# Patient Record
Sex: Female | Born: 1940 | Race: White | Hispanic: No | Smoking: Never smoker
Health system: Southern US, Community
[De-identification: ages and names within clinical notes are randomized; demographics above are authoritative.]

## PROBLEM LIST (undated history)

## (undated) DIAGNOSIS — R55 Syncope and collapse: Secondary | ICD-10-CM

## (undated) DIAGNOSIS — I951 Orthostatic hypotension: Secondary | ICD-10-CM

## (undated) DIAGNOSIS — K219 Gastro-esophageal reflux disease without esophagitis: Secondary | ICD-10-CM

## (undated) DIAGNOSIS — I10 Essential (primary) hypertension: Secondary | ICD-10-CM

## (undated) DIAGNOSIS — W19XXXA Unspecified fall, initial encounter: Secondary | ICD-10-CM

## (undated) DIAGNOSIS — N319 Neuromuscular dysfunction of bladder, unspecified: Secondary | ICD-10-CM

## (undated) DIAGNOSIS — N189 Chronic kidney disease, unspecified: Secondary | ICD-10-CM

## (undated) DIAGNOSIS — I63131 Cerebral infarction due to embolism of right carotid artery: Secondary | ICD-10-CM

## (undated) DIAGNOSIS — C801 Malignant (primary) neoplasm, unspecified: Secondary | ICD-10-CM

## (undated) DIAGNOSIS — I639 Cerebral infarction, unspecified: Secondary | ICD-10-CM

## (undated) DIAGNOSIS — I350 Nonrheumatic aortic (valve) stenosis: Secondary | ICD-10-CM

## (undated) DIAGNOSIS — G2581 Restless legs syndrome: Secondary | ICD-10-CM

## (undated) DIAGNOSIS — R1310 Dysphagia, unspecified: Secondary | ICD-10-CM

## (undated) DIAGNOSIS — G20A1 Parkinson's disease without dyskinesia, without mention of fluctuations: Secondary | ICD-10-CM

## (undated) DIAGNOSIS — R011 Cardiac murmur, unspecified: Secondary | ICD-10-CM

## (undated) DIAGNOSIS — K921 Melena: Secondary | ICD-10-CM

## (undated) DIAGNOSIS — D649 Anemia, unspecified: Secondary | ICD-10-CM

## (undated) DIAGNOSIS — K449 Diaphragmatic hernia without obstruction or gangrene: Secondary | ICD-10-CM

## (undated) DIAGNOSIS — R413 Other amnesia: Secondary | ICD-10-CM

## (undated) DIAGNOSIS — R269 Unspecified abnormalities of gait and mobility: Secondary | ICD-10-CM

## (undated) DIAGNOSIS — R51 Headache: Secondary | ICD-10-CM

## (undated) DIAGNOSIS — R0602 Shortness of breath: Secondary | ICD-10-CM

## (undated) DIAGNOSIS — Z9289 Personal history of other medical treatment: Secondary | ICD-10-CM

## (undated) DIAGNOSIS — E785 Hyperlipidemia, unspecified: Secondary | ICD-10-CM

## (undated) DIAGNOSIS — M1990 Unspecified osteoarthritis, unspecified site: Secondary | ICD-10-CM

## (undated) DIAGNOSIS — G20 Parkinson's disease: Secondary | ICD-10-CM

## (undated) DIAGNOSIS — G4752 REM sleep behavior disorder: Secondary | ICD-10-CM

## (undated) HISTORY — DX: Anemia, unspecified: D64.9

## (undated) HISTORY — DX: Syncope and collapse: R55

## (undated) HISTORY — DX: Neuromuscular dysfunction of bladder, unspecified: N31.9

## (undated) HISTORY — DX: Unspecified osteoarthritis, unspecified site: M19.90

## (undated) HISTORY — DX: Headache: R51

## (undated) HISTORY — PX: CATARACT EXTRACTION, BILATERAL: SHX1313

## (undated) HISTORY — PX: UPPER GI ENDOSCOPY: SHX6162

## (undated) HISTORY — PX: CAROTID ENDARTERECTOMY: SUR193

## (undated) HISTORY — DX: Chronic kidney disease, unspecified: N18.9

## (undated) HISTORY — DX: REM sleep behavior disorder: G47.52

## (undated) HISTORY — DX: Unspecified fall, initial encounter: W19.XXXA

## (undated) HISTORY — DX: Gastro-esophageal reflux disease without esophagitis: K21.9

## (undated) HISTORY — DX: Unspecified abnormalities of gait and mobility: R26.9

## (undated) HISTORY — PX: COLONOSCOPY: SHX174

## (undated) HISTORY — DX: Cerebral infarction due to embolism of right carotid artery: I63.131

## (undated) HISTORY — DX: Malignant (primary) neoplasm, unspecified: C80.1

## (undated) HISTORY — DX: Hyperlipidemia, unspecified: E78.5

## (undated) HISTORY — PX: EYE SURGERY: SHX253

## (undated) HISTORY — DX: Parkinson's disease: G20

## (undated) HISTORY — DX: Parkinson's disease without dyskinesia, without mention of fluctuations: G20.A1

## (undated) HISTORY — DX: Restless legs syndrome: G25.81

## (undated) HISTORY — DX: Dysphagia, unspecified: R13.10

## (undated) HISTORY — DX: Orthostatic hypotension: I95.1

## (undated) HISTORY — DX: Other amnesia: R41.3

## (undated) HISTORY — DX: Essential (primary) hypertension: I10

---

## 2007-06-23 HISTORY — PX: OTHER SURGICAL HISTORY: SHX169

## 2007-08-27 ENCOUNTER — Ambulatory Visit: Payer: Self-pay | Admitting: Unknown Physician Specialty

## 2007-09-06 ENCOUNTER — Ambulatory Visit: Payer: Self-pay | Admitting: Orthopaedic Surgery

## 2007-10-25 ENCOUNTER — Ambulatory Visit: Payer: Self-pay | Admitting: Unknown Physician Specialty

## 2007-12-13 ENCOUNTER — Ambulatory Visit: Payer: Self-pay | Admitting: Anesthesiology

## 2008-01-02 ENCOUNTER — Ambulatory Visit: Payer: Self-pay | Admitting: Anesthesiology

## 2008-02-09 ENCOUNTER — Ambulatory Visit: Payer: Self-pay | Admitting: Anesthesiology

## 2008-03-13 ENCOUNTER — Ambulatory Visit: Payer: Self-pay | Admitting: Gynecologic Oncology

## 2008-03-14 ENCOUNTER — Ambulatory Visit: Payer: Self-pay | Admitting: Anesthesiology

## 2008-03-20 ENCOUNTER — Ambulatory Visit: Payer: Self-pay | Admitting: Gynecologic Oncology

## 2008-03-27 ENCOUNTER — Ambulatory Visit: Payer: Self-pay

## 2008-03-28 ENCOUNTER — Ambulatory Visit: Payer: Self-pay | Admitting: Gynecologic Oncology

## 2008-04-10 ENCOUNTER — Ambulatory Visit: Payer: Self-pay | Admitting: Gynecologic Oncology

## 2008-04-13 ENCOUNTER — Ambulatory Visit: Payer: Self-pay | Admitting: Anesthesiology

## 2008-04-20 ENCOUNTER — Ambulatory Visit: Payer: Self-pay | Admitting: Family Medicine

## 2008-05-21 ENCOUNTER — Ambulatory Visit: Payer: Self-pay | Admitting: Anesthesiology

## 2008-11-06 ENCOUNTER — Encounter: Admission: RE | Admit: 2008-11-06 | Discharge: 2008-11-06 | Payer: Self-pay | Admitting: Neurology

## 2009-05-22 ENCOUNTER — Emergency Department: Payer: Self-pay | Admitting: Emergency Medicine

## 2009-06-06 ENCOUNTER — Emergency Department: Payer: Self-pay | Admitting: Emergency Medicine

## 2009-12-25 ENCOUNTER — Emergency Department: Payer: Self-pay | Admitting: Emergency Medicine

## 2010-01-01 ENCOUNTER — Ambulatory Visit: Payer: Self-pay | Admitting: Family Medicine

## 2010-04-22 ENCOUNTER — Ambulatory Visit (HOSPITAL_COMMUNITY)
Admission: RE | Admit: 2010-04-22 | Discharge: 2010-04-22 | Payer: Self-pay | Source: Home / Self Care | Admitting: Neurology

## 2010-06-05 ENCOUNTER — Encounter
Admission: RE | Admit: 2010-06-05 | Discharge: 2010-06-23 | Payer: Self-pay | Source: Home / Self Care | Attending: Neurology | Admitting: Neurology

## 2010-06-23 ENCOUNTER — Encounter
Admission: RE | Admit: 2010-06-23 | Discharge: 2010-07-22 | Payer: Self-pay | Source: Home / Self Care | Attending: Neurology | Admitting: Neurology

## 2010-07-02 ENCOUNTER — Encounter: Admit: 2010-07-02 | Payer: Self-pay | Admitting: Neurology

## 2010-07-04 ENCOUNTER — Encounter: Admit: 2010-07-04 | Payer: Self-pay | Admitting: Neurology

## 2010-07-09 ENCOUNTER — Encounter: Admit: 2010-07-09 | Payer: Self-pay | Admitting: Neurology

## 2010-07-12 ENCOUNTER — Encounter: Payer: Self-pay | Admitting: Neurology

## 2010-08-15 ENCOUNTER — Other Ambulatory Visit (HOSPITAL_COMMUNITY): Payer: Self-pay | Admitting: Neurology

## 2010-08-20 ENCOUNTER — Ambulatory Visit (HOSPITAL_COMMUNITY)
Admission: RE | Admit: 2010-08-20 | Discharge: 2010-08-20 | Disposition: A | Discharge status: Home / Self Care | Payer: Medicare Other | Source: Ambulatory Visit | Attending: Neurology | Admitting: Neurology

## 2010-08-20 DIAGNOSIS — G20 Parkinson's disease: Secondary | ICD-10-CM | POA: Insufficient documentation

## 2010-08-20 DIAGNOSIS — R1310 Dysphagia, unspecified: Secondary | ICD-10-CM | POA: Insufficient documentation

## 2010-08-20 DIAGNOSIS — G20A1 Parkinson's disease without dyskinesia, without mention of fluctuations: Secondary | ICD-10-CM | POA: Insufficient documentation

## 2010-10-09 ENCOUNTER — Inpatient Hospital Stay: Payer: Self-pay | Admitting: Internal Medicine

## 2010-10-17 ENCOUNTER — Ambulatory Visit: Payer: Medicare Other | Attending: Neurology | Admitting: Physical Therapy

## 2010-12-12 ENCOUNTER — Ambulatory Visit: Payer: Self-pay | Admitting: Internal Medicine

## 2010-12-26 ENCOUNTER — Ambulatory Visit: Payer: Self-pay | Admitting: Internal Medicine

## 2011-01-21 ENCOUNTER — Ambulatory Visit: Payer: Self-pay | Admitting: Internal Medicine

## 2011-01-21 ENCOUNTER — Ambulatory Visit: Payer: Self-pay | Admitting: Gastroenterology

## 2011-01-23 LAB — PATHOLOGY REPORT

## 2011-12-10 ENCOUNTER — Ambulatory Visit: Payer: Self-pay | Admitting: Internal Medicine

## 2012-02-15 ENCOUNTER — Other Ambulatory Visit: Payer: Self-pay | Admitting: Neurology

## 2012-02-15 ENCOUNTER — Ambulatory Visit
Admission: RE | Admit: 2012-02-15 | Discharge: 2012-02-15 | Disposition: A | Discharge status: Home / Self Care | Payer: Medicare Other | Source: Ambulatory Visit | Attending: Neurology | Admitting: Neurology

## 2012-02-15 DIAGNOSIS — M25562 Pain in left knee: Secondary | ICD-10-CM

## 2012-05-04 ENCOUNTER — Ambulatory Visit: Payer: Self-pay | Admitting: Internal Medicine

## 2012-06-21 DIAGNOSIS — E785 Hyperlipidemia, unspecified: Secondary | ICD-10-CM | POA: Insufficient documentation

## 2012-06-21 DIAGNOSIS — I10 Essential (primary) hypertension: Secondary | ICD-10-CM | POA: Insufficient documentation

## 2012-06-21 DIAGNOSIS — H269 Unspecified cataract: Secondary | ICD-10-CM | POA: Insufficient documentation

## 2012-06-21 DIAGNOSIS — IMO0001 Reserved for inherently not codable concepts without codable children: Secondary | ICD-10-CM | POA: Insufficient documentation

## 2012-06-21 DIAGNOSIS — R413 Other amnesia: Secondary | ICD-10-CM | POA: Insufficient documentation

## 2012-06-21 DIAGNOSIS — R1310 Dysphagia, unspecified: Secondary | ICD-10-CM | POA: Insufficient documentation

## 2012-10-04 ENCOUNTER — Ambulatory Visit (INDEPENDENT_AMBULATORY_CARE_PROVIDER_SITE_OTHER): Payer: Medicare Other | Admitting: Neurology

## 2012-10-04 ENCOUNTER — Encounter: Payer: Self-pay | Admitting: Neurology

## 2012-10-04 VITALS — BP 128/54 | HR 78 | Ht 61.0 in | Wt 124.0 lb

## 2012-10-04 DIAGNOSIS — G20A1 Parkinson's disease without dyskinesia, without mention of fluctuations: Secondary | ICD-10-CM | POA: Insufficient documentation

## 2012-10-04 DIAGNOSIS — R269 Unspecified abnormalities of gait and mobility: Secondary | ICD-10-CM

## 2012-10-04 DIAGNOSIS — G20 Parkinson's disease: Secondary | ICD-10-CM

## 2012-10-04 NOTE — Progress Notes (Signed)
Reason for visit: Parkinson's disease  Brianna Coleman is an 72 y.o. female  History of present illness:  Brianna Coleman is a 72 year old right-handed white female with a history of Parkinson's disease. The patient has been doing fairly well. The patient was having episodes of dizziness and syncope, but she has not had any further problems with this. The patient has had some gait instability, and she uses a cane for ambulation. The patient has not had any falls. The patient may stagger some. The patient is being followed for chronic renal insufficiency. The patient is getting Epogen shots for anemia. The patient is staying active, and she is engaged in exercises through the [employer] program. The patient returns to this office for an evaluation. The patient notes that in the morning, she is quite slow, and she is more unsteady on her feet for about 1 or 2 hours. The medication then kicks in, and she does better towards the afternoon.  Past Medical History  Diagnosis Date  . Parkinson disease   . Dyslipidemia   . Hypertension   . Arthritis, degenerative   . Gastroesophageal reflux disease   . RLS (restless legs syndrome)   . Neurogenic bladder   . Dysphagia   . Chronic renal insufficiency   . Anemia   . Memory disturbance   . Episode of syncope     Near-syncope    Past Surgical History  Procedure Laterality Date  . Cataract extraction, bilateral      Family History  Problem Relation Age of Onset  . Cancer Mother   . Heart attack Father   . Diabetes Brother   . Diabetes Brother     Social history:  reports that she has never smoked. She does not have any smokeless tobacco history on file. She reports that she does not drink alcohol. Her drug history is not on file.  Allergies: No Known Allergies  Medications:  No current outpatient prescriptions on file prior to visit.   No current facility-administered medications on file prior to visit.    ROS:  Out of a  complete 14 system review of symptoms, the patient complains only of the following symptoms, and all other reviewed systems are negative.  Weight loss, fatigue Heart murmur, swelling in the legs Difficulty swallowing Shortness of breath, wheezing Incontinence of bowel Easy bruising, easy bleeding Muscle cramps Memory loss, dizziness Snoring, restless legs  Blood pressure 128/54, pulse 78, height 5\' 1"  (1.549 m), weight 124 lb (56.246 kg).  Physical Exam  General: The patient is alert and cooperative at the time of the examination.  Skin: 2-3+ ankle edema is noted bilaterally.   Neurologic Exam  Cranial nerves: Facial symmetry is present. Speech is normal, no aphasia or dysarthria is noted. Extraocular movements are full. Visual fields are full.  Motor: The patient has good strength in all 4 extremities.  Coordination: The patient has good finger-nose-finger and heel-to-shin bilaterally.  Gait and station: The patient is able to arise from a seated position with the arms crossed. Once up, the patient ambulates quite well independently, slight decrease in arm swing on the left. Tandem gait is slightly unsteady. Romberg is negative. No drift is seen.  Reflexes: Deep tendon reflexes are symmetric.   Assessment/Plan:  One. Parkinson's disease  2. Gait instability  The patient is actually doing quite well at this time. The patient does not function well first thing in the morning, and she needs to take her first dose of Sinemet even before  she gets out of bed. The patient is remaining active, and she is progressing very slowly with her Parkinson's disease. The patient should use a cane when out of the house. The patient will followup in 5 months.  Marlan Palau MD 10/04/2012 6:29 PM  [facility] [address] [place], [place] [address]  Phone [phone] Fax [phone]

## 2012-12-21 ENCOUNTER — Other Ambulatory Visit: Payer: Self-pay

## 2012-12-21 MED ORDER — HYDROCODONE-ACETAMINOPHEN 5-325 MG PO TABS: 1.0000 | ORAL_TABLET | Freq: Four times a day (QID) | ORAL | Status: DC | PRN

## 2013-03-03 ENCOUNTER — Ambulatory Visit (INDEPENDENT_AMBULATORY_CARE_PROVIDER_SITE_OTHER): Payer: Medicare Other | Admitting: Neurology

## 2013-03-03 ENCOUNTER — Encounter: Payer: Self-pay | Admitting: Neurology

## 2013-03-03 VITALS — BP 108/53 | HR 58 | Ht 60.4 in | Wt 125.0 lb

## 2013-03-03 DIAGNOSIS — G20 Parkinson's disease: Secondary | ICD-10-CM

## 2013-03-03 DIAGNOSIS — R269 Unspecified abnormalities of gait and mobility: Secondary | ICD-10-CM

## 2013-03-03 MED ORDER — PRAMIPEXOLE DIHYDROCHLORIDE 1 MG PO TABS: 1.0000 mg | ORAL_TABLET | Freq: Three times a day (TID) | ORAL | Status: DC

## 2013-03-03 NOTE — Progress Notes (Signed)
Reason for visit: Parkinson's disease  Brianna Coleman is an 72 y.o. female  History of present illness:  Brianna Coleman is a 72 year old right-handed white female with a history of Parkinson's disease, chronic renal insufficiency, and hemodialysis may be required in the future. Last December 2013, the patient was having episodes of near-syncope, and she was found to have severe anemia associated with a chronic renal insufficiency. The patient has been on erythropoietin, and her blood count has improved to a hemoglobin of around 9. The patient is still having some mild gait instability, and she notes that the left leg will cross over at times, making her stumble. The patient uses a cane inconsistently. The patient still has some problems with restless leg syndrome, but she is resting well at night. The patient does report some occasional dizziness. Her mobility has gradually worsened over time. The patient is involved with silver sneakers, but she does not go to the classes on a regular basis. The patient returns for an evaluation. The patient does report some ongoing issues with swallowing and choking.  Past Medical History  Diagnosis Date  . Parkinson disease   . Dyslipidemia   . Hypertension   . Arthritis, degenerative   . Gastroesophageal reflux disease   . RLS (restless legs syndrome)   . Neurogenic bladder   . Dysphagia   . Chronic renal insufficiency   . Anemia   . Memory disturbance   . Episode of syncope     Near-syncope  . Gait disorder     Past Surgical History  Procedure Laterality Date  . Cataract extraction, bilateral      Family History  Problem Relation Age of Onset  . Cancer Mother   . Heart attack Father   . Diabetes Brother   . Diabetes Brother     Social history:  reports that she has never smoked. She has never used smokeless tobacco. She reports that she does not drink alcohol or use illicit drugs.   No Known Allergies  Medications:  Current  Outpatient Prescriptions on File Prior to Visit  Medication Sig Dispense Refill  . aspirin 81 MG tablet Take 81 mg by mouth daily.      . carbidopa-levodopa (SINEMET IR) 25-250 MG per tablet Take 25-250 tablets by mouth 4 (four) times daily. 1/2 tablet four times daily      . epoetin alfa (EPOGEN,PROCRIT) 2000 UNIT/ML injection Inject 40,000 Units into the skin once. Once monthly      . HYDROcodone-acetaminophen (NORCO/VICODIN) 5-325 MG per tablet Take 1 tablet by mouth every 6 (six) hours as needed for pain.  90 tablet  1  . metoprolol succinate (TOPROL-XL) 100 MG 24 hr tablet Take 100 mg by mouth daily.      Marland Kitchen NIFEdipine (PROCARDIA-XL/ADALAT CC) 30 MG 24 hr tablet Take 30 mg by mouth daily.       No current facility-administered medications on file prior to visit.    ROS:  Out of a complete 14 system review of symptoms, the patient complains only of the following symptoms, and all other reviewed systems are negative.  Swelling the legs Difficulty swallowing Itching Shortness of breath, wheezing, snoring Incontinence of bowel and bladder Anemia, easy bruising Feeling cold Joint pain Memory loss, weakness, slurred speech, difficulty swallowing, dizziness Snoring, restless legs  Blood pressure 108/53, pulse 58, height 5' 0.4" (1.534 m), weight 125 lb (56.7 kg).  Physical Exam  General: The patient is alert and cooperative at the time of the examination.  Mild masking of the face is seen.  Skin: 2-3+ edema is noted below the knees bilaterally.   Neurologic Exam  Cranial nerves: Facial symmetry is present. Speech is normal, no aphasia or dysarthria is noted. Extraocular movements are full. Visual fields are full.  Motor: The patient has good strength in all 4 extremities.  Coordination: The patient has good finger-nose-finger and heel-to-shin bilaterally.  Gait and station: The patient is able to arise from a seated position with arms crossed. Once up, the patient appears to  have good stride and good turns, the left arm is in a dystonic posture, extended, and slightly behind her. Decreased arm swing is noted on the left. The patient usually uses a cane for ambulation. Tandem gait is unsteady. Romberg is negative. No drift is seen.  Reflexes: Deep tendon reflexes are symmetric.   Assessment/Plan:  One. Parkinson's disease  2. Gait disorder  3. Chronic renal insufficiency  4. Dysphagia  5. Memory disorder  The patient appears to have relatively good mobility, but she needs to remain active with her exercise programs. The patient will be increased on the Mirapex taking 1 mg 3 times daily. The patient will followup in 4 or 5 months. The Sinemet will not be readjusted, the patient will continue the 25/250 tablets, taking one half tablet 4 times daily.  Brianna Palau MD 03/03/2013 9:02 PM  [facility] [address] [place], [place] [address]  Phone [phone] Fax [phone]

## 2013-03-06 ENCOUNTER — Ambulatory Visit: Payer: Medicare Other | Admitting: Neurology

## 2013-06-26 ENCOUNTER — Ambulatory Visit: Payer: Self-pay | Admitting: Hematology and Oncology

## 2013-07-06 ENCOUNTER — Ambulatory Visit: Payer: Self-pay | Admitting: Hematology and Oncology

## 2013-07-06 LAB — CBC CANCER CENTER
BASOS ABS: 0 x10 3/mm (ref 0.0–0.1)
Basophil %: 1 %
EOS ABS: 0 x10 3/mm (ref 0.0–0.7)
Eosinophil %: 0.3 %
HCT: 23.1 % — ABNORMAL LOW (ref 35.0–47.0)
HGB: 6.8 g/dL — ABNORMAL LOW (ref 12.0–16.0)
LYMPHS ABS: 0.6 x10 3/mm — AB (ref 1.0–3.6)
Lymphocyte %: 17.6 %
MCH: 22.6 pg — ABNORMAL LOW (ref 26.0–34.0)
MCHC: 29.4 g/dL — ABNORMAL LOW (ref 32.0–36.0)
MCV: 77 fL — AB (ref 80–100)
Monocyte #: 0.3 x10 3/mm (ref 0.2–0.9)
Monocyte %: 7.3 %
NEUTROS PCT: 73.8 %
Neutrophil #: 2.7 x10 3/mm (ref 1.4–6.5)
Platelet: 232 x10 3/mm (ref 150–440)
RBC: 3.02 10*6/uL — ABNORMAL LOW (ref 3.80–5.20)
RDW: 18.3 % — AB (ref 11.5–14.5)
WBC: 3.7 x10 3/mm (ref 3.6–11.0)

## 2013-07-06 LAB — BASIC METABOLIC PANEL
Anion Gap: 10 (ref 7–16)
BUN: 46 mg/dL — ABNORMAL HIGH (ref 7–18)
CALCIUM: 8.2 mg/dL — AB (ref 8.5–10.1)
CO2: 24 mmol/L (ref 21–32)
CREATININE: 2.39 mg/dL — AB (ref 0.60–1.30)
Chloride: 105 mmol/L (ref 98–107)
EGFR (African American): 23 — ABNORMAL LOW
EGFR (Non-African Amer.): 19 — ABNORMAL LOW
Glucose: 94 mg/dL (ref 65–99)
Osmolality: 289 (ref 275–301)
Potassium: 4.6 mmol/L (ref 3.5–5.1)
Sodium: 139 mmol/L (ref 136–145)

## 2013-07-10 ENCOUNTER — Other Ambulatory Visit: Payer: Self-pay | Admitting: Neurology

## 2013-07-17 ENCOUNTER — Telehealth: Payer: Self-pay | Admitting: Neurology

## 2013-07-17 NOTE — Telephone Encounter (Signed)
Daughter called in and stated that her mother had a really bad episode late last week and they wanted to know if they would be able to get in to see Dr. Jannifer Franklin earlier than the March appointment.  Daughter stated that it was a "freezing" type episode, where her mother was in the process of getting out of the car, and then just everything froze and her body wouldn't move and her brain wasn't telling it what to do.  Daughter stated that during exercise class her mother was unable to understand the instructor or get her body to follow along with the steps.  Also stated that she was disoriented and out of it for quite a little bit.  Please call.  Thank you.

## 2013-07-17 NOTE — Telephone Encounter (Signed)
I called the daughter. The patient had an event of freezing getting out of a car, and had some confusion for about an hour afterwards. The episode was a one-time event, this has not recurred. I'll try to get the patient worked in sometime within the next 2 or 3 weeks. Her next revisit is not until March.

## 2013-07-23 ENCOUNTER — Ambulatory Visit: Payer: Self-pay | Admitting: Hematology and Oncology

## 2013-07-26 ENCOUNTER — Encounter (INDEPENDENT_AMBULATORY_CARE_PROVIDER_SITE_OTHER): Payer: Self-pay

## 2013-07-26 ENCOUNTER — Ambulatory Visit (INDEPENDENT_AMBULATORY_CARE_PROVIDER_SITE_OTHER): Payer: Medicare Other | Admitting: Neurology

## 2013-07-26 ENCOUNTER — Encounter: Payer: Self-pay | Admitting: Neurology

## 2013-07-26 VITALS — BP 115/63 | HR 69 | Wt 127.0 lb

## 2013-07-26 DIAGNOSIS — R413 Other amnesia: Secondary | ICD-10-CM

## 2013-07-26 DIAGNOSIS — R4182 Altered mental status, unspecified: Secondary | ICD-10-CM

## 2013-07-26 DIAGNOSIS — R269 Unspecified abnormalities of gait and mobility: Secondary | ICD-10-CM

## 2013-07-26 DIAGNOSIS — G20 Parkinson's disease: Secondary | ICD-10-CM

## 2013-07-26 MED ORDER — HYDROCODONE-ACETAMINOPHEN 5-325 MG PO TABS: 1.0000 | ORAL_TABLET | Freq: Four times a day (QID) | ORAL | Status: DC | PRN

## 2013-07-26 NOTE — Patient Instructions (Signed)

## 2013-07-26 NOTE — Progress Notes (Signed)
Reason for visit: Parkinson's disease  Brianna Coleman is an 73 y.o. female  History of present illness:    Brianna Coleman is a 73 year old right-handed white female with a history of Parkinson's disease. The patient has had an event recently unassociated with a freezing episode while getting out of a car. The patient was unable to move for several moments, but eventually she began being able to walk. The patient usually walks with a cane. The patient however, appeared to be somewhat confused for about an hour after the event. The patient has had at least 2 other events associated with confusion, but the patient may also have some slurred speech. The patient was noted recently to have an episode of left-sided facial drooping. The patient has not had any falls. The patient is having some problems with scissoring of her left leg across the right and she is trying to walk. The patient may also walk with the left arm behind her, and with decreased arm swing on the left. The patient occasionally will miss the midday doses of her medication, and she seems to be having more general issues with memory. The patient denies any problems with choking with swallowing. The patient returns to this office for an evaluation.  Past Medical History  Diagnosis Date  . Parkinson disease   . Dyslipidemia   . Hypertension   . Arthritis, degenerative   . Gastroesophageal reflux disease   . RLS (restless legs syndrome)   . Neurogenic bladder   . Dysphagia   . Chronic renal insufficiency   . Anemia   . Memory disturbance   . Episode of syncope     Near-syncope  . Gait disorder     Past Surgical History  Procedure Laterality Date  . Cataract extraction, bilateral      Family History  Problem Relation Age of Onset  . Cancer Mother   . Heart attack Father   . Diabetes Brother   . Diabetes Brother     Social history:  reports that she has never smoked. She has never used smokeless tobacco. She reports  that she does not drink alcohol or use illicit drugs.   No Known Allergies  Medications:  Current Outpatient Prescriptions on File Prior to Visit  Medication Sig Dispense Refill  . aspirin 81 MG tablet Take 81 mg by mouth daily.      . carbidopa-levodopa (SINEMET IR) 25-250 MG per tablet TAKE 1/2 A TABLET BY MOUTH 4 TIMES A DAY (MORNING,NOON,4 PM, AND NIGHT)  180 tablet  1  . enalapril (VASOTEC) 10 MG tablet Take 5 mg by mouth daily.       Marland Kitchen epoetin alfa (EPOGEN,PROCRIT) 2000 UNIT/ML injection Inject 40,000 Units into the skin once. Once monthly      . metoprolol succinate (TOPROL-XL) 100 MG 24 hr tablet Take 100 mg by mouth daily.      Marland Kitchen NIFEdipine (PROCARDIA-XL/ADALAT CC) 30 MG 24 hr tablet Take 30 mg by mouth daily.      . pramipexole (MIRAPEX) 1 MG tablet Take 1 tablet (1 mg total) by mouth 3 (three) times daily.  270 tablet  3   No current facility-administered medications on file prior to visit.    ROS:  Out of a complete 14 system review of symptoms, the patient complains only of the following symptoms, and all other reviewed systems are negative.  Fatigue Difficulty swallowing Heart murmur Cold intolerance Restless legs Snoring, sleep talking Incontinence of bladder, frequency of urination, urinary  urgency Back pain, difficulty walking, coordination problems Bruising easily, anemia Memory loss, dizziness, speech problems, weakness, facial drooping, left side Confusion   Blood pressure 115/63, pulse 69, weight 127 lb (57.607 kg).  Physical Exam  General: The patient is alert and cooperative at the time of the examination.  Skin: 1+ edema at the ankles is noted bilaterally.   Neurologic Exam  Mental status: The patient is oriented x 3. Mini-Mental status examination done today shows a total score of 27/30.  Cranial nerves: Facial symmetry is present. Speech is normal, no aphasia or dysarthria is noted. Extraocular movements are full. Visual fields are full.  Masking of the face is seen.  Motor: The patient has good strength in all 4 extremities.  Sensory examination: Soft touch sensation is symmetric on the face, arms, or legs.  Coordination: The patient has good finger-nose-finger and heel-to-shin bilaterally.  Gait and station: The patient has a relatively stable gait. Decreased arm swing is noted with the left arm. The patient carries the left arm slightly behind her. The patient is able to a arise from a seated position with arms crossed. Tandem gait is minimally unsteady. Romberg is negative. No drift is seen.  Reflexes: Deep tendon reflexes are symmetric.   Assessment/Plan:  One. Parkinson's disease  2. Episodic confusion, freezing  The patient certainly may be at risk for freezing episodes, but episodes described recently are associated with confusion, and the patient has had an episode of left facial drooping and slurred speech. The patient will be evaluated for possible cerebrovascular disease. The patient will be set up for MRI evaluation of the brain, carotid Doppler study, and an EEG study. The patient is on low-dose aspirin. In the future, Comtan may be added to her medication regimen. The patient will followup in March of 2015. 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  Jill Alexanders MD 07/26/2013 8:26 PM  [facility] [address] [place], [place] [address]  Phone [phone] Fax [phone]

## 2013-08-02 ENCOUNTER — Ambulatory Visit (INDEPENDENT_AMBULATORY_CARE_PROVIDER_SITE_OTHER): Payer: Medicare Other

## 2013-08-02 ENCOUNTER — Telehealth: Payer: Self-pay | Admitting: Neurology

## 2013-08-02 DIAGNOSIS — R413 Other amnesia: Secondary | ICD-10-CM

## 2013-08-02 DIAGNOSIS — R269 Unspecified abnormalities of gait and mobility: Secondary | ICD-10-CM

## 2013-08-02 DIAGNOSIS — G20 Parkinson's disease: Secondary | ICD-10-CM

## 2013-08-02 DIAGNOSIS — R4182 Altered mental status, unspecified: Secondary | ICD-10-CM

## 2013-08-02 NOTE — Telephone Encounter (Signed)
I called patient and I talked with the caretaker. The patient had an EEG study today that was normal. The patient continues to have some problems with memory and confusion, no further freezing episodes. MRI the brain and carotid Doppler studies are pending.

## 2013-08-02 NOTE — Procedures (Signed)
    History:  Brianna Coleman is a 73 year old patient with a history of Parkinson's disease. The patient recently had an event of freezing up associated with confusion and slurred speech lasting about one hour. The patient is being evaluated for this event.  This is a routine EEG. No skull defects are noted. Medications include aspirin, Sinemet, Vasotec, Epogen, hydrocodone, metoprolol, nifedipine, and Mirapex.   EEG classification: Normal awake  Description of the recording: The background rhythms of this recording consists of a fairly well modulated medium amplitude alpha rhythm of 8 Hz that is reactive to eye opening and closure. As the record progresses, the patient appears to remain in the waking state throughout the recording. Photic stimulation was performed, resulting in a bilateral and symmetric photic driving response. Hyperventilation was also performed, resulting in a minimal buildup of the background rhythm activities without significant slowing seen. At no time during the recording does there appear to be evidence of spike or spike wave discharges or evidence of focal slowing. EKG monitor shows no evidence of cardiac rhythm abnormalities with a heart rate of 56.  Impression: This is a normal EEG recording in the waking state. No evidence of ictal or interictal discharges are seen.

## 2013-08-03 ENCOUNTER — Inpatient Hospital Stay: Payer: Self-pay | Admitting: Internal Medicine

## 2013-08-03 LAB — CBC WITH DIFFERENTIAL/PLATELET
BASOS PCT: 1 %
Basophil #: 0 10*3/uL (ref 0.0–0.1)
Eosinophil #: 0 10*3/uL (ref 0.0–0.7)
Eosinophil %: 0.5 %
HCT: 18.6 % — ABNORMAL LOW (ref 35.0–47.0)
HGB: 5.9 g/dL — AB (ref 12.0–16.0)
Lymphocyte #: 0.5 10*3/uL — ABNORMAL LOW (ref 1.0–3.6)
Lymphocyte %: 18.2 %
MCH: 22.8 pg — AB (ref 26.0–34.0)
MCHC: 31.5 g/dL — AB (ref 32.0–36.0)
MCV: 72 fL — ABNORMAL LOW (ref 80–100)
MONOS PCT: 8.1 %
Monocyte #: 0.2 x10 3/mm (ref 0.2–0.9)
Neutrophil #: 2.1 10*3/uL (ref 1.4–6.5)
Neutrophil %: 72.2 %
Platelet: 144 10*3/uL — ABNORMAL LOW (ref 150–440)
RBC: 2.58 10*6/uL — ABNORMAL LOW (ref 3.80–5.20)
RDW: 18.6 % — ABNORMAL HIGH (ref 11.5–14.5)
WBC: 2.9 10*3/uL — ABNORMAL LOW (ref 3.6–11.0)

## 2013-08-03 LAB — PROTIME-INR
INR: 1.1
Prothrombin Time: 13.8 secs (ref 11.5–14.7)

## 2013-08-03 LAB — IRON AND TIBC
IRON BIND. CAP.(TOTAL): 373 ug/dL (ref 250–450)
IRON: 18 ug/dL — AB (ref 50–170)
Iron Saturation: 5 %
UNBOUND IRON-BIND. CAP.: 355 ug/dL

## 2013-08-03 LAB — TROPONIN I

## 2013-08-03 LAB — COMPREHENSIVE METABOLIC PANEL
ALK PHOS: 66 U/L
AST: 16 U/L (ref 15–37)
Albumin: 3.1 g/dL — ABNORMAL LOW (ref 3.4–5.0)
Anion Gap: 3 — ABNORMAL LOW (ref 7–16)
BILIRUBIN TOTAL: 0.2 mg/dL (ref 0.2–1.0)
BUN: 58 mg/dL — AB (ref 7–18)
CHLORIDE: 112 mmol/L — AB (ref 98–107)
CO2: 22 mmol/L (ref 21–32)
Calcium, Total: 8.5 mg/dL (ref 8.5–10.1)
Creatinine: 2.47 mg/dL — ABNORMAL HIGH (ref 0.60–1.30)
EGFR (African American): 22 — ABNORMAL LOW
GFR CALC NON AF AMER: 19 — AB
Glucose: 84 mg/dL (ref 65–99)
Osmolality: 289 (ref 275–301)
POTASSIUM: 4.8 mmol/L (ref 3.5–5.1)
SGPT (ALT): 7 U/L — ABNORMAL LOW (ref 12–78)
Sodium: 137 mmol/L (ref 136–145)
TOTAL PROTEIN: 6.5 g/dL (ref 6.4–8.2)

## 2013-08-03 LAB — APTT: Activated PTT: 29.6 secs (ref 23.6–35.9)

## 2013-08-03 LAB — HEMOGLOBIN: HGB: 7.2 g/dL — ABNORMAL LOW (ref 12.0–16.0)

## 2013-08-03 LAB — RETICULOCYTES
ABSOLUTE RETIC COUNT: 0.0302 10*6/uL (ref 0.019–0.186)
RETICULOCYTE: 1.17 % (ref 0.4–3.1)

## 2013-08-03 LAB — FERRITIN: Ferritin (ARMC): 5 ng/mL — ABNORMAL LOW (ref 8–388)

## 2013-08-04 ENCOUNTER — Other Ambulatory Visit: Payer: Medicare Other

## 2013-08-04 LAB — BASIC METABOLIC PANEL
Anion Gap: 5 — ABNORMAL LOW (ref 7–16)
BUN: 52 mg/dL — AB (ref 7–18)
CHLORIDE: 111 mmol/L — AB (ref 98–107)
CREATININE: 2.28 mg/dL — AB (ref 0.60–1.30)
Calcium, Total: 8.6 mg/dL (ref 8.5–10.1)
Co2: 24 mmol/L (ref 21–32)
EGFR (African American): 24 — ABNORMAL LOW
EGFR (Non-African Amer.): 21 — ABNORMAL LOW
GLUCOSE: 79 mg/dL (ref 65–99)
Osmolality: 292 (ref 275–301)
Potassium: 4.3 mmol/L (ref 3.5–5.1)
SODIUM: 140 mmol/L (ref 136–145)

## 2013-08-04 LAB — CBC WITH DIFFERENTIAL/PLATELET
BASOS ABS: 0.1 10*3/uL (ref 0.0–0.1)
BASOS PCT: 1.7 %
EOS ABS: 0.1 10*3/uL (ref 0.0–0.7)
EOS PCT: 2.1 %
HCT: 21.3 % — ABNORMAL LOW (ref 35.0–47.0)
HGB: 6.9 g/dL — ABNORMAL LOW (ref 12.0–16.0)
Lymphocyte #: 0.7 10*3/uL — ABNORMAL LOW (ref 1.0–3.6)
Lymphocyte %: 22.2 %
MCH: 24 pg — ABNORMAL LOW (ref 26.0–34.0)
MCHC: 32.3 g/dL (ref 32.0–36.0)
MCV: 74 fL — ABNORMAL LOW (ref 80–100)
Monocyte #: 0.3 x10 3/mm (ref 0.2–0.9)
Monocyte %: 10.5 %
NEUTROS ABS: 1.9 10*3/uL (ref 1.4–6.5)
Neutrophil %: 63.5 %
Platelet: 127 10*3/uL — ABNORMAL LOW (ref 150–440)
RBC: 2.86 10*6/uL — ABNORMAL LOW (ref 3.80–5.20)
RDW: 19.9 % — ABNORMAL HIGH (ref 11.5–14.5)
WBC: 2.9 10*3/uL — ABNORMAL LOW (ref 3.6–11.0)

## 2013-08-04 LAB — URINALYSIS, COMPLETE
BILIRUBIN, UR: NEGATIVE
GLUCOSE, UR: NEGATIVE mg/dL (ref 0–75)
Ketone: NEGATIVE
NITRITE: NEGATIVE
PROTEIN: NEGATIVE
Ph: 5 (ref 4.5–8.0)
RBC,UR: 1 /HPF (ref 0–5)
Specific Gravity: 1.005 (ref 1.003–1.030)
Squamous Epithelial: <1
WBC UR: 82 /HPF (ref 0–5)

## 2013-08-04 LAB — HEMOGLOBIN: HGB: 9.4 g/dL — AB (ref 12.0–16.0)

## 2013-08-07 LAB — PATHOLOGY REPORT

## 2013-08-12 ENCOUNTER — Ambulatory Visit
Admission: RE | Admit: 2013-08-12 | Discharge: 2013-08-12 | Disposition: A | Discharge status: Home / Self Care | Payer: Medicare Other | Source: Ambulatory Visit | Attending: Neurology | Admitting: Neurology

## 2013-08-12 DIAGNOSIS — R269 Unspecified abnormalities of gait and mobility: Secondary | ICD-10-CM

## 2013-08-12 DIAGNOSIS — G20 Parkinson's disease: Secondary | ICD-10-CM

## 2013-08-12 DIAGNOSIS — R4182 Altered mental status, unspecified: Secondary | ICD-10-CM

## 2013-08-12 DIAGNOSIS — R413 Other amnesia: Secondary | ICD-10-CM

## 2013-08-14 LAB — CBC CANCER CENTER
Basophil #: 0.1 x10 3/mm (ref 0.0–0.1)
Basophil %: 1.4 %
Eosinophil #: 0 x10 3/mm (ref 0.0–0.7)
Eosinophil %: 1 %
HCT: 31.9 % — AB (ref 35.0–47.0)
HGB: 9.8 g/dL — ABNORMAL LOW (ref 12.0–16.0)
LYMPHS PCT: 19.7 %
Lymphocyte #: 0.7 x10 3/mm — ABNORMAL LOW (ref 1.0–3.6)
MCH: 24.4 pg — ABNORMAL LOW (ref 26.0–34.0)
MCHC: 30.8 g/dL — AB (ref 32.0–36.0)
MCV: 79 fL — AB (ref 80–100)
MONO ABS: 0.3 x10 3/mm (ref 0.2–0.9)
MONOS PCT: 7.1 %
NEUTROS PCT: 70.8 %
Neutrophil #: 2.7 x10 3/mm (ref 1.4–6.5)
Platelet: 215 x10 3/mm (ref 150–440)
RBC: 4.02 10*6/uL (ref 3.80–5.20)
RDW: 23.6 % — ABNORMAL HIGH (ref 11.5–14.5)
WBC: 3.8 x10 3/mm (ref 3.6–11.0)

## 2013-08-15 ENCOUNTER — Telehealth: Payer: Self-pay | Admitting: Neurology

## 2013-08-15 NOTE — Telephone Encounter (Signed)
I called the patient, talked with the daughter. The patient is getting a transfusion with a hemoglobin of 5.9. The patient has chronic renal insufficiency, and this is felt to be the etiology of the anemia, the patient has not been getting frequent erythropoietin injections. This could also be a factor that negatively impacts Parkinson's disease, possibly related to confusion. 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 No acute changes are seen. The patient is on low-dose aspirin.   MRI brain 08/14/2013:  Impression   Abnormal MRI scan the brain showing remote age small lacunar  infarcts in both basal ganglia as well as left posterior parietal cortex.  The mild changes of small vessel disease and generalized cerebral atrophy.

## 2013-08-20 ENCOUNTER — Ambulatory Visit: Payer: Self-pay | Admitting: Hematology and Oncology

## 2013-08-23 ENCOUNTER — Telehealth: Payer: Self-pay | Admitting: Neurology

## 2013-08-23 ENCOUNTER — Ambulatory Visit (INDEPENDENT_AMBULATORY_CARE_PROVIDER_SITE_OTHER): Payer: Medicare Other

## 2013-08-23 DIAGNOSIS — R269 Unspecified abnormalities of gait and mobility: Secondary | ICD-10-CM

## 2013-08-23 DIAGNOSIS — R413 Other amnesia: Secondary | ICD-10-CM

## 2013-08-23 DIAGNOSIS — G20 Parkinson's disease: Secondary | ICD-10-CM

## 2013-08-23 DIAGNOSIS — R4182 Altered mental status, unspecified: Secondary | ICD-10-CM

## 2013-08-23 NOTE — Telephone Encounter (Signed)
I called patient. 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 This may correlate with the episode described by the family with confusion and left facial droop and slurred speech. This event likely represented a TIA, and the patient will be referred to a vascular surgeon for an opinion concerning a carotid enterectomy if the patient is amenable to this. The patient will contact our office if she is agreeable to this referral.

## 2013-08-24 ENCOUNTER — Telehealth: Payer: Self-pay | Admitting: Neurology

## 2013-08-24 DIAGNOSIS — I6529 Occlusion and stenosis of unspecified carotid artery: Secondary | ICD-10-CM

## 2013-08-24 NOTE — Telephone Encounter (Signed)
I called the patient again, and I talked with the patient. The carotid Doppler study shows 50-69% stenosis of the carotid bulb on the right, and greater than 70% stenosis in the proximal to mid right internal carotid artery. The patient had a clinical TIA event associated with left facial droop. I have recommended referral to a vascular surgeon, the patient is amenable to this.

## 2013-09-08 ENCOUNTER — Other Ambulatory Visit: Payer: Self-pay | Admitting: *Deleted

## 2013-09-08 DIAGNOSIS — I6529 Occlusion and stenosis of unspecified carotid artery: Secondary | ICD-10-CM

## 2013-09-13 ENCOUNTER — Encounter: Payer: Self-pay | Admitting: Vascular Surgery

## 2013-09-14 ENCOUNTER — Other Ambulatory Visit: Payer: Self-pay | Admitting: *Deleted

## 2013-09-14 ENCOUNTER — Encounter: Payer: Self-pay | Admitting: Vascular Surgery

## 2013-09-14 ENCOUNTER — Ambulatory Visit (INDEPENDENT_AMBULATORY_CARE_PROVIDER_SITE_OTHER): Payer: Medicare Other | Admitting: Vascular Surgery

## 2013-09-14 ENCOUNTER — Encounter (HOSPITAL_COMMUNITY): Payer: Self-pay | Admitting: Pharmacy Technician

## 2013-09-14 ENCOUNTER — Ambulatory Visit (HOSPITAL_COMMUNITY)
Admission: RE | Admit: 2013-09-14 | Discharge: 2013-09-14 | Disposition: A | Discharge status: Home / Self Care | Payer: Medicare Other | Source: Ambulatory Visit | Attending: Vascular Surgery | Admitting: Vascular Surgery

## 2013-09-14 ENCOUNTER — Encounter (INDEPENDENT_AMBULATORY_CARE_PROVIDER_SITE_OTHER): Payer: Self-pay

## 2013-09-14 ENCOUNTER — Other Ambulatory Visit: Payer: Self-pay | Admitting: Vascular Surgery

## 2013-09-14 ENCOUNTER — Encounter: Payer: Self-pay | Admitting: *Deleted

## 2013-09-14 VITALS — BP 98/41 | HR 67 | Resp 18 | Ht 60.0 in | Wt 126.3 lb

## 2013-09-14 DIAGNOSIS — I6529 Occlusion and stenosis of unspecified carotid artery: Secondary | ICD-10-CM

## 2013-09-14 DIAGNOSIS — G459 Transient cerebral ischemic attack, unspecified: Secondary | ICD-10-CM

## 2013-09-14 NOTE — Progress Notes (Signed)
[facility] [place] HISTORY AND PHYSICAL   History of Present Illness:  Patient is a 73 y.o. year old female referred by Dr. Jannifer Franklin for evaluation of symptomatic right internal carotid artery stenosis. The patient had an event in mid January where she had left facial droop and garbled speech which lasted for approximately 24 hours. She has not had any events since then. She denies any history of amaurosis. She denies prior history of stroke. 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 She is on aspirin 81 mg daily. Other chronic medical problems include Parkinson's, hyperlipidemia, hypertension, arthritis, reflux, mild dysphasia secondary to Parkinson's, chronic renal insufficiency, history of heart murmur followed by [facility] clinic in [place] (Dr  Milagros Loll).  Past Medical History  Diagnosis Date  . Parkinson disease   . Dyslipidemia   . Hypertension   . Arthritis, degenerative   . Gastroesophageal reflux disease   . RLS (restless legs syndrome)   . Neurogenic bladder   . Dysphagia   . Chronic renal insufficiency   . Anemia   . Memory disturbance   . Episode of syncope     Near-syncope  . Gait disorder     Past Surgical History  Procedure Laterality Date  . Cataract extraction, bilateral    . Vaginal cyst removal  2009    Social History History  Substance Use Topics  . Smoking status: Never Smoker   . Smokeless tobacco: Never Used  . Alcohol Use: No    Family History Family History  Problem Relation Age of Onset  . Cancer Mother   . Hypertension Mother   . Heart disease Mother   . Hyperlipidemia Mother   . Heart attack Father   . Hypertension Father   . Diabetes Brother   . Diabetes Brother   . Heart disease Brother   . Hyperlipidemia Brother   . Hypertension Brother   . Varicose Veins Brother   . Hypertension Sister     Allergies  No Known Allergies   Current Outpatient Prescriptions  Medication Sig  Dispense Refill  . carbidopa-levodopa (SINEMET IR) 25-250 MG per tablet TAKE 1/2 A TABLET BY MOUTH 4 TIMES A DAY (MORNING,NOON,4 PM, AND NIGHT)  180 tablet  1  . darbepoetin (ARANESP) 100 MCG/0.5ML SOLN injection Inject 100 mcg as directed every 14 (fourteen) days.      . enalapril (VASOTEC) 10 MG tablet Take 5 mg by mouth daily.       Marland Kitchen HYDROcodone-acetaminophen (NORCO/VICODIN) 5-325 MG per tablet Take 1 tablet by mouth every 6 (six) hours as needed.  90 tablet  0  . metoprolol succinate (TOPROL-XL) 100 MG 24 hr tablet Take 100 mg by mouth daily.      . Multiple Vitamin (MULTIVITAMIN) tablet Take 1 tablet by mouth daily.      Marland Kitchen NIFEdipine (PROCARDIA-XL/ADALAT CC) 30 MG 24 hr tablet Take 30 mg by mouth daily.      . pramipexole (MIRAPEX) 1 MG tablet Take 1 tablet (1 mg total) by mouth 3 (three) times daily.  270 tablet  3  . aspirin 81 MG tablet Take 81 mg by mouth daily.      Marland Kitchen epoetin alfa (EPOGEN,PROCRIT) 2000 UNIT/ML injection Inject 40,000 Units into the skin once. Once monthly       No current facility-administered medications for this visit.    ROS:   General:  No weight loss, Fever, chills  HEENT: No recent headaches, no nasal bleeding, no  visual changes, no sore throat  Neurologic: No dizziness, blackouts, seizures. No recent symptoms of stroke or mini- stroke. No recent episodes of slurred speech, or temporary blindness.  Cardiac: No recent episodes of chest pain/pressure, no shortness of breath at rest.  + shortness of breath with exertion.  Denies history of atrial fibrillation or irregular heartbeat  Vascular: No history of rest pain in feet.  No history of claudication.  No history of non-healing ulcer, No history of DVT   Pulmonary: No home oxygen, no productive cough, no hemoptysis,  + asthma or wheezing  Musculoskeletal:  [ x] Arthritis, [ ]  Low back pain,  [ ]  Joint pain  Hematologic:No history of hypercoagulable state.  No history of easy bleeding.  No history of  anemia  Gastrointestinal: No hematochezia or melena,  + gastroesophageal reflux, + trouble swallowing  Urinary: [x ] chronic Kidney disease, [ ]  on HD - [ ]  MWF or [ ]  TTHS, [ ]  Burning with urination, [ ]  Frequent urination, [ ]  Difficulty urinating;   Skin: No rashes  Psychological: No history of anxiety,  No history of depression   Physical Examination  Filed Vitals:   09/14/13 1334  BP: 98/41  Pulse: 67  Resp: 18  Height: 5' (1.524 m)  Weight: 126 lb 4.8 oz (57.289 kg)    Body mass index is 24.67 kg/(m^2).  General:  Alert and oriented, no acute distress HEENT: Normal Neck: No bruit or JVD Pulmonary: Clear to auscultation bilaterally Cardiac: Regular Rate and Rhythm with 3 or 6 systolic murmur heard best in the right second interspace but throughout the precordium Abdomen: Soft, non-tender, non-distended, no mass Skin: No rash Extremity Pulses:  2+ radial, brachial, femoral  pulses bilaterally Musculoskeletal: No deformity or edema  Neurologic: Upper and lower extremity motor 5/5 and symmetric  DATA:  I reviewed her previous carotid duplex exam from [facility] neurology dated 08/23/2013. Shows a greater than 70% stenosis of the right internal carotid artery with velocities of 360/90 left side had no significant stenosis. We repeated her right carotid duplex today in our office for operative planning purposes. This showed a peak systolic velocity of 144/81 cm/s. I reviewed and interpreted the findings.    ASSESSMENT:  Greater than 70% stenosis right internal carotid artery symptomatic with recent TIA   PLAN:  Right carotid endarterectomy Tuesday, March 31. Risks benefits possible complications and procedure details including but not limited to bleeding infection stroke cranial nerve injury were discussed with the patient and her daughter today. Understand agree to proceed.  Ruta Hinds, MD [facility] [place] Office: [phone] Pager:  [phone]

## 2013-09-18 ENCOUNTER — Encounter: Payer: Self-pay | Admitting: Neurology

## 2013-09-18 ENCOUNTER — Encounter (INDEPENDENT_AMBULATORY_CARE_PROVIDER_SITE_OTHER): Payer: Self-pay

## 2013-09-18 ENCOUNTER — Ambulatory Visit (INDEPENDENT_AMBULATORY_CARE_PROVIDER_SITE_OTHER): Payer: Medicare Other | Admitting: Neurology

## 2013-09-18 ENCOUNTER — Encounter (HOSPITAL_COMMUNITY)
Admission: RE | Admit: 2013-09-18 | Discharge: 2013-09-18 | Disposition: A | Discharge status: Home / Self Care | Payer: Medicare Other | Source: Ambulatory Visit | Attending: Anesthesiology | Admitting: Anesthesiology

## 2013-09-18 ENCOUNTER — Encounter (HOSPITAL_COMMUNITY): Payer: Self-pay

## 2013-09-18 ENCOUNTER — Encounter (HOSPITAL_COMMUNITY)
Admission: RE | Admit: 2013-09-18 | Discharge: 2013-09-18 | Disposition: A | Discharge status: Home / Self Care | Payer: Medicare Other | Source: Ambulatory Visit | Attending: Vascular Surgery | Admitting: Vascular Surgery

## 2013-09-18 VITALS — BP 103/52 | HR 69 | Wt 128.0 lb

## 2013-09-18 DIAGNOSIS — R269 Unspecified abnormalities of gait and mobility: Secondary | ICD-10-CM

## 2013-09-18 DIAGNOSIS — G20 Parkinson's disease: Secondary | ICD-10-CM

## 2013-09-18 HISTORY — DX: Shortness of breath: R06.02

## 2013-09-18 HISTORY — DX: Nonrheumatic aortic (valve) stenosis: I35.0

## 2013-09-18 HISTORY — DX: Cerebral infarction, unspecified: I63.9

## 2013-09-18 HISTORY — DX: Melena: K92.1

## 2013-09-18 HISTORY — DX: Personal history of other medical treatment: Z92.89

## 2013-09-18 HISTORY — DX: Cardiac murmur, unspecified: R01.1

## 2013-09-18 LAB — CBC
HEMATOCRIT: 30.3 % — AB (ref 36.0–46.0)
Hemoglobin: 9.3 g/dL — ABNORMAL LOW (ref 12.0–15.0)
MCH: 26.6 pg (ref 26.0–34.0)
MCHC: 30.7 g/dL (ref 30.0–36.0)
MCV: 86.8 fL (ref 78.0–100.0)
PLATELETS: 263 10*3/uL (ref 150–400)
RBC: 3.49 MIL/uL — ABNORMAL LOW (ref 3.87–5.11)
RDW: 24.7 % — ABNORMAL HIGH (ref 11.5–15.5)
WBC: 5.2 10*3/uL (ref 4.0–10.5)

## 2013-09-18 LAB — COMPREHENSIVE METABOLIC PANEL
ALT: <5 U/L (ref 0–35)
AST: 8 U/L (ref 0–37)
Albumin: 2.9 g/dL — ABNORMAL LOW (ref 3.5–5.2)
Alkaline Phosphatase: 68 U/L (ref 39–117)
BILIRUBIN TOTAL: 0.3 mg/dL (ref 0.3–1.2)
BUN: 37 mg/dL — AB (ref 6–23)
CO2: 21 mEq/L (ref 19–32)
CREATININE: 2.09 mg/dL — AB (ref 0.50–1.10)
Calcium: 8.7 mg/dL (ref 8.4–10.5)
Chloride: 103 mEq/L (ref 96–112)
GFR calc Af Amer: 26 mL/min — ABNORMAL LOW (ref >90)
GFR calc non Af Amer: 22 mL/min — ABNORMAL LOW (ref >90)
Glucose, Bld: 86 mg/dL (ref 70–99)
Potassium: 5 mEq/L (ref 3.7–5.3)
Sodium: 137 mEq/L (ref 137–147)
Total Protein: 6.3 g/dL (ref 6.0–8.3)

## 2013-09-18 LAB — PROTIME-INR
INR: 0.99 (ref 0.00–1.49)
PROTHROMBIN TIME: 12.9 s (ref 11.6–15.2)

## 2013-09-18 LAB — ABO/RH: ABO/RH(D): A POS

## 2013-09-18 LAB — TYPE AND SCREEN
ABO/RH(D): A POS
Antibody Screen: NEGATIVE

## 2013-09-18 LAB — APTT: aPTT: 29 seconds (ref 24–37)

## 2013-09-18 LAB — SURGICAL PCR SCREEN
MRSA, PCR: NEGATIVE
Staphylococcus aureus: NEGATIVE

## 2013-09-18 MED ORDER — DEXTROSE 5 % IV SOLN
1.5000 g | INTRAVENOUS | Status: DC
  Filled 2013-09-18: qty 1.5

## 2013-09-18 NOTE — Progress Notes (Signed)
Reason for visit: Parkinson's disease  Brianna Coleman is an 73 y.o. female  History of present illness:  Brianna Coleman is a 73 year old right-handed white female with a history of Parkinson's disease. The patient had an episode of paralysis and confusion prior to her last revisit. Workup showed a high-grade stenosis of the right internal carotid artery. The patient will be going for a carotid endarterectomy tomorrow. The patient has had some worsening of balance, and she will stumble without falls. The patient has a cane, but she does not use the cane consistently. The patient is on low-dose Sinemet taking the 25/250 mg tablets, one half tablet 4 times daily. The patient is also on Mirapex taking 1 mg 3 times daily. The patient does report ongoing problems with dysphagia that is slightly worse, and she has vivid dreams at night. The patient indicates that she is able to get back to sleep when she wakes up in the nighttime. The patient returns to this office for an evaluation. The patient indicates that she is involved with silver sneakers exercise program.  Past Medical History  Diagnosis Date  . Parkinson disease   . Dyslipidemia   . Hypertension   . Arthritis, degenerative   . Gastroesophageal reflux disease   . RLS (restless legs syndrome)   . Neurogenic bladder   . Dysphagia   . Anemia   . Memory disturbance   . Episode of syncope     Near-syncope  . Gait disorder   . Heart murmur   . Stroke     no residual  . Shortness of breath     with exertion  . Blood in stool   . History of blood transfusion   . Aortic stenosis     mild AS by 03/20/13 echo [facility])  . Chronic renal insufficiency     CKD stage IV, sees Dr Holley Raring in [place]    Past Surgical History  Procedure Laterality Date  . Cataract extraction, bilateral    . Vaginal cyst removal  2009  . Colonoscopy    . Upper gi endoscopy    . Eye surgery Bilateral     cataract    Family History    Problem Relation Age of Onset  . Cancer Mother   . Hypertension Mother   . Heart disease Mother   . Hyperlipidemia Mother   . Heart attack Father   . Hypertension Father   . Diabetes Brother   . Diabetes Brother   . Heart disease Brother   . Hyperlipidemia Brother   . Hypertension Brother   . Varicose Veins Brother   . Hypertension Sister     Social history:  reports that she has never smoked. She has never used smokeless tobacco. She reports that she does not drink alcohol or use illicit drugs.   No Known Allergies  Medications:  Current Outpatient Prescriptions on File Prior to Visit  Medication Sig Dispense Refill  . aspirin 81 MG tablet Take 81 mg by mouth daily.      . carbidopa-levodopa (SINEMET IR) 25-250 MG per tablet TAKE 1/2 A TABLET BY MOUTH 4 TIMES A DAY (MORNING,NOON,4 PM, AND NIGHT)  180 tablet  1  . enalapril (VASOTEC) 2.5 MG tablet Take 2.5 mg by mouth daily.      Marland Kitchen HYDROcodone-acetaminophen (NORCO/VICODIN) 5-325 MG per tablet Take 1 tablet by mouth every 6 (six) hours as needed for moderate pain.      . metoprolol succinate (TOPROL-XL) 100 MG 24  hr tablet Take 100 mg by mouth daily.      Marland Kitchen NIFEdipine (PROCARDIA-XL/ADALAT CC) 30 MG 24 hr tablet Take 30 mg by mouth daily.      . pramipexole (MIRAPEX) 1 MG tablet Take 1 tablet (1 mg total) by mouth 3 (three) times daily.  270 tablet  3  . Multiple Vitamin (MULTIVITAMIN) tablet Take 1 tablet by mouth daily.       Current Facility-Administered Medications on File Prior to Visit  Medication Dose Route Frequency Provider Last Rate Last Dose  . cefUROXime (ZINACEF) 1.5 g in dextrose 5 % 50 mL IVPB  1.5 g Intravenous 30 min Pre-Op Elam Dutch, MD        ROS:  Out of a complete 14 system review of symptoms, the patient complains only of the following symptoms, and all other reviewed systems are negative.  Fatigue Wheezing Leg swelling Frequent waking, acting out dreams Incontinence of bladder, frequency of  urination Back pain Dizziness, numbness  Blood pressure 103/52, pulse 69, weight 128 lb (58.06 kg).  Physical Exam  General: The patient is alert and cooperative at the time of the examination.  Skin: 2+ edema at the ankles is noted bilaterally.   Neurologic Exam  Mental status: The patient is oriented x 3. Mini-Mental status examination done today shows a total score 28/30.  Cranial nerves: Facial symmetry is present. Speech is normal, no aphasia or dysarthria is noted. Extraocular movements are full. Visual fields are full.  Motor: The patient has good strength in all 4 extremities.  Sensory examination: Soft touch sensation is symmetric on the face, arms, or legs.  Coordination: The patient has good finger-nose-finger and heel-to-shin bilaterally.  Gait and station: The patient is able to arise from a seated position with arms crossed. Once up, the patient and legs well without assistance, with returns. The patient has bilateral arm swing, less prominent on the left. Tandem gait is slightly unsteady. Romberg is negative. No drift is seen.  Reflexes: Deep tendon reflexes are symmetric.   Assessment/Plan:  1. Parkinson's disease  2. Gait disturbance  3. Right internal carotid artery stenosis  The patient will be going for a carotid endarterectomy. The patient is to use her cane when she is out of the house. The patient will continue her exercise program. The patient is having some issues with swallowing, and a reevaluation for the swallowing may need to be done in the future if this continues to worsen. No medication adjustments were made today, the patient has good mobility today. The patient will followup in 4 or 5 months.  Jill Alexanders MD 09/18/2013 8:33 PM  [facility] [address] [place], [place] [address]  Phone [phone] Fax [phone]

## 2013-09-18 NOTE — Progress Notes (Addendum)
Anesthesia chart review: Patient is a 73 year old female scheduled for right carotid endarterectomy tomorrow by Dr. Oneida Alar. Her neurologist referred patient to [facility] after work-up for an episode of brief paralysis with confusion, left facial droop and dysarthria revealed hight grade RICA stenosis.  History includes Parkinson's disease (Dr. Jannifer Franklin), hypertension, dyslipidemia, GERD, RLS, neurogenic bladder, dysphagia, anemia, CVA (remote infarct by 07/2013 MRI following TIA symptoms), chronic kidney disease stage V (Dr. Anthonette Legato; [phone]), nonsmoker, anemia with history of transfusion for symptomatic anemia (H/H 5.9/18.6, iron 18) 07/2013.  Apparently she had an EGD at [facility].  I don't have the report, but reportedly patient was told her CKD and iron deficiency anemia may be playing more of a role.  A lower GI study may be done sometime in the future.     Cardiologist is Dr. Saralyn Pilar with [facility] last visit 07/27/13. He felt she would be low risk for CV complications with this procedure. She is involved in the [employer] exercise program.  EKG on 09/18/13 showed NSR, RSR prime in V1, V2.   Echo on 03/20/13 [facility]) showed: Normal LV systolic function. EF 60%. Mild left atrial enlargement. Mild left ventricular hypertrophy. Mild mitral and tricuspid insufficiency. Mild aortic stenosis. CXR on 09/18/13 showed large hiatal hernia, and upper lumbar vertebral showed small to moderate compression deformity, age uncertain. Lungs clear.  According to Dr. Oneida Alar' notes, "DATA: I reviewed her previous carotid duplex exam from [facility] neurology dated 08/23/2013. Shows a greater than 70% stenosis of the right internal carotid artery with velocities of 360/90 left side had no significant stenosis. We repeated her right carotid duplex today in our office for operative planning purposes. This showed a peak systolic velocity of 128/78 cm/s."  MRI of the brain on 08/12/13 showed: IMPRESSION: Abnormal MRI  scan the brain showing remote age small lacunar infarcts in both basal ganglia as well as left posterior parietal cortex. The mild changes of small vessel disease and generalized cerebral atrophy.  Preoperative labs noted.  BUN/Cr 37/2.09.  H/H 9.3/30.3.  PT/PTT WNL. PAT was unable to get a urine specimen, so UA will have to be done on arrival.  T&S already done. Nephrology and [facility] records received just prior to 5 PM.  Her BUN Cr on 08/12/13 were 58/1.47, 52/2.28 on 08/04/13, and 42/2.34 on 05/08/13. Following her transfusion her HGB was up to 9.4 on 08/04/13.  She has also been on Aranesp. Her renal labs and H/H appear around her baseline.  They can be monitored post-operatively.  Cardiology has cleared her from their standpoint.  If no acute changes then I would anticipate that she could proceed as planned. Further evaluation by her assigned anesthesiologist on the day of surgery.  George [facility]/Anesthesiology Phone [phone] 09/18/2013 5:18 PM

## 2013-09-18 NOTE — Patient Instructions (Signed)

## 2013-09-18 NOTE — Progress Notes (Signed)
I spoke with Brianna Coleman at [facility] home with anew arrival time of 6:30

## 2013-09-18 NOTE — Pre-Procedure Instructions (Addendum)
Brianna Coleman  09/18/2013   Your procedure is scheduled on:  Tuesday, Tuesday, March 31.  Report to [facility], Main Entrance or Entrance "A" at 5:30 AM.  Call this number if you have problems the morning of surgery: [phone]   Remember:   Do not eat food or drink liquids after midnight.   Take these medicines the morning of surgery with A SIP OF WATER:aspirin, carbidopa-levodopa (SINEMET IR), metoprolol succinate (TOPROL-XL), NIFEdipine (PROCARDIA-XL/ADALAT), pramipexole (MIRAPEX).    Do not wear jewelry, make-up or nail polish.  Do not wear lotions, powders, or perfumes.   Do not shave 48 hours prior to surgery.   Do not bring valuables to the hospital.  [facility] is not responsible for any belongings or valuables.               Contacts, dentures or bridgework may not be worn into surgery.  Leave suitcase in the car. After surgery it may be brought to your room.  For patients admitted to the hospital, discharge time is determined by your treatment team.             Special Instructions: Review  [place] - Preparing For Surgery.   Please read over the following fact sheets that you were given: Pain Booklet, Coughing and Deep Breathing, Blood Transfusion Information and Surgical Site Infection Prevention

## 2013-09-19 ENCOUNTER — Inpatient Hospital Stay (HOSPITAL_COMMUNITY): Payer: Medicare Other | Admitting: Anesthesiology

## 2013-09-19 ENCOUNTER — Encounter (HOSPITAL_COMMUNITY): Payer: Medicare Other | Admitting: Vascular Surgery

## 2013-09-19 ENCOUNTER — Encounter (HOSPITAL_COMMUNITY)
Admission: RE | Disposition: A | Discharge status: Home / Self Care | Payer: Self-pay | Source: Ambulatory Visit | Attending: Vascular Surgery

## 2013-09-19 ENCOUNTER — Inpatient Hospital Stay (HOSPITAL_COMMUNITY)
Admission: RE | Admit: 2013-09-19 | Discharge: 2013-09-20 | DRG: 038 | Disposition: A | Discharge status: Home / Self Care | Payer: Medicare Other | Source: Ambulatory Visit | Attending: Vascular Surgery | Admitting: Vascular Surgery

## 2013-09-19 ENCOUNTER — Encounter (HOSPITAL_COMMUNITY): Payer: Self-pay | Admitting: *Deleted

## 2013-09-19 DIAGNOSIS — Z7982 Long term (current) use of aspirin: Secondary | ICD-10-CM

## 2013-09-19 DIAGNOSIS — M129 Arthropathy, unspecified: Secondary | ICD-10-CM | POA: Diagnosis present

## 2013-09-19 DIAGNOSIS — G20A1 Parkinson's disease without dyskinesia, without mention of fluctuations: Secondary | ICD-10-CM | POA: Diagnosis present

## 2013-09-19 DIAGNOSIS — D62 Acute posthemorrhagic anemia: Secondary | ICD-10-CM | POA: Diagnosis not present

## 2013-09-19 DIAGNOSIS — N189 Chronic kidney disease, unspecified: Secondary | ICD-10-CM | POA: Diagnosis present

## 2013-09-19 DIAGNOSIS — K219 Gastro-esophageal reflux disease without esophagitis: Secondary | ICD-10-CM | POA: Diagnosis present

## 2013-09-19 DIAGNOSIS — I498 Other specified cardiac arrhythmias: Secondary | ICD-10-CM | POA: Diagnosis not present

## 2013-09-19 DIAGNOSIS — I6529 Occlusion and stenosis of unspecified carotid artery: Principal | ICD-10-CM | POA: Diagnosis present

## 2013-09-19 DIAGNOSIS — E785 Hyperlipidemia, unspecified: Secondary | ICD-10-CM | POA: Diagnosis present

## 2013-09-19 DIAGNOSIS — I129 Hypertensive chronic kidney disease with stage 1 through stage 4 chronic kidney disease, or unspecified chronic kidney disease: Secondary | ICD-10-CM | POA: Diagnosis present

## 2013-09-19 DIAGNOSIS — G20 Parkinson's disease: Secondary | ICD-10-CM | POA: Diagnosis present

## 2013-09-19 DIAGNOSIS — N184 Chronic kidney disease, stage 4 (severe): Secondary | ICD-10-CM | POA: Diagnosis present

## 2013-09-19 DIAGNOSIS — Z8673 Personal history of transient ischemic attack (TIA), and cerebral infarction without residual deficits: Secondary | ICD-10-CM

## 2013-09-19 HISTORY — PX: ENDARTERECTOMY: SHX5162

## 2013-09-19 LAB — URINALYSIS, ROUTINE W REFLEX MICROSCOPIC
Bilirubin Urine: NEGATIVE
Glucose, UA: NEGATIVE mg/dL
Hgb urine dipstick: NEGATIVE
Ketones, ur: NEGATIVE mg/dL
NITRITE: NEGATIVE
Protein, ur: NEGATIVE mg/dL
SPECIFIC GRAVITY, URINE: 1.01 (ref 1.005–1.030)
UROBILINOGEN UA: 0.2 mg/dL (ref 0.0–1.0)
pH: 5.5 (ref 5.0–8.0)

## 2013-09-19 LAB — GLUCOSE, CAPILLARY: GLUCOSE-CAPILLARY: 101 mg/dL — AB (ref 70–99)

## 2013-09-19 LAB — URINE MICROSCOPIC-ADD ON

## 2013-09-19 SURGERY — ENDARTERECTOMY, CAROTID
Anesthesia: General | Site: Neck | Laterality: Right

## 2013-09-19 MED ORDER — THROMBIN 20000 UNITS EX SOLR
CUTANEOUS | Status: AC
  Filled 2013-09-19: qty 20000

## 2013-09-19 MED ORDER — OXYCODONE HCL 5 MG PO TABS
5.0000 mg | ORAL_TABLET | ORAL | Status: DC | PRN
  Administered 2013-09-19 (×2): 5 mg via ORAL
  Filled 2013-09-19 (×2): qty 1

## 2013-09-19 MED ORDER — ENALAPRIL MALEATE 2.5 MG PO TABS
2.5000 mg | ORAL_TABLET | Freq: Every day | ORAL | Status: DC
  Filled 2013-09-19: qty 1

## 2013-09-19 MED ORDER — ONDANSETRON HCL 4 MG/2ML IJ SOLN: 4.0000 mg | Freq: Four times a day (QID) | INTRAMUSCULAR | Status: DC | PRN

## 2013-09-19 MED ORDER — ALUM & MAG HYDROXIDE-SIMETH 200-200-20 MG/5ML PO SUSP
15.0000 mL | ORAL | Status: DC | PRN
Start: 2013-09-19 — End: 2013-09-20

## 2013-09-19 MED ORDER — NEOSTIGMINE METHYLSULFATE 1 MG/ML IJ SOLN
INTRAMUSCULAR | Status: DC | PRN
  Administered 2013-09-19: 3 mg via INTRAVENOUS

## 2013-09-19 MED ORDER — HEPARIN SODIUM (PORCINE) 5000 UNIT/ML IJ SOLN
5000.0000 [IU] | Freq: Three times a day (TID) | INTRAMUSCULAR | Status: DC
  Filled 2013-09-19 (×5): qty 1

## 2013-09-19 MED ORDER — PROTAMINE SULFATE 10 MG/ML IV SOLN
INTRAVENOUS | Status: DC | PRN
Start: 2013-09-19 — End: 2013-09-19
  Administered 2013-09-19: 60 mg via INTRAVENOUS

## 2013-09-19 MED ORDER — GLYCOPYRROLATE 0.2 MG/ML IJ SOLN
INTRAMUSCULAR | Status: AC
  Filled 2013-09-19: qty 2

## 2013-09-19 MED ORDER — PROTAMINE SULFATE 10 MG/ML IV SOLN
INTRAVENOUS | Status: AC
  Filled 2013-09-19: qty 5

## 2013-09-19 MED ORDER — WHITE PETROLATUM GEL
Status: AC
  Filled 2013-09-19: qty 5

## 2013-09-19 MED ORDER — CARBIDOPA-LEVODOPA 25-250 MG PO TABS
0.5000 | ORAL_TABLET | Freq: Four times a day (QID) | ORAL | Status: DC
  Administered 2013-09-19 (×2): 0.5 via ORAL
  Filled 2013-09-19 (×6): qty 1

## 2013-09-19 MED ORDER — CHLORHEXIDINE GLUCONATE 4 % EX LIQD
60.0000 mL | Freq: Once | CUTANEOUS | Status: DC
  Filled 2013-09-19: qty 60

## 2013-09-19 MED ORDER — PROMETHAZINE HCL 25 MG/ML IJ SOLN: 6.2500 mg | INTRAMUSCULAR | Status: DC | PRN

## 2013-09-19 MED ORDER — PRAMIPEXOLE DIHYDROCHLORIDE 1 MG PO TABS
1.0000 mg | ORAL_TABLET | Freq: Three times a day (TID) | ORAL | Status: DC
  Administered 2013-09-19 – 2013-09-20 (×3): 1 mg via ORAL
  Filled 2013-09-19 (×5): qty 1

## 2013-09-19 MED ORDER — HEPARIN SODIUM (PORCINE) 1000 UNIT/ML IJ SOLN
INTRAMUSCULAR | Status: AC
  Filled 2013-09-19: qty 1

## 2013-09-19 MED ORDER — ONDANSETRON HCL 4 MG/2ML IJ SOLN
INTRAMUSCULAR | Status: AC
  Filled 2013-09-19: qty 2

## 2013-09-19 MED ORDER — METOPROLOL TARTRATE 1 MG/ML IV SOLN: 2.0000 mg | INTRAVENOUS | Status: DC | PRN

## 2013-09-19 MED ORDER — OXYCODONE HCL 5 MG/5ML PO SOLN: 5.0000 mg | Freq: Once | ORAL | Status: DC | PRN

## 2013-09-19 MED ORDER — SODIUM CHLORIDE 0.9 % IV SOLN: 250.0000 mL | INTRAVENOUS | Status: DC | PRN

## 2013-09-19 MED ORDER — PHENYLEPHRINE HCL 10 MG/ML IJ SOLN
10.0000 mg | INTRAVENOUS | Status: DC | PRN
  Administered 2013-09-19: 10 ug/min via INTRAVENOUS

## 2013-09-19 MED ORDER — ROCURONIUM BROMIDE 50 MG/5ML IV SOLN
INTRAVENOUS | Status: AC
  Filled 2013-09-19: qty 1

## 2013-09-19 MED ORDER — POTASSIUM CHLORIDE CRYS ER 20 MEQ PO TBCR: 20.0000 meq | EXTENDED_RELEASE_TABLET | Freq: Every day | ORAL | Status: DC | PRN

## 2013-09-19 MED ORDER — ROCURONIUM BROMIDE 100 MG/10ML IV SOLN
INTRAVENOUS | Status: DC | PRN
  Administered 2013-09-19: 40 mg via INTRAVENOUS

## 2013-09-19 MED ORDER — LIDOCAINE HCL (CARDIAC) 20 MG/ML IV SOLN
INTRAVENOUS | Status: AC
  Filled 2013-09-19: qty 5

## 2013-09-19 MED ORDER — HYDROCODONE-ACETAMINOPHEN 5-325 MG PO TABS
1.0000 | ORAL_TABLET | Freq: Four times a day (QID) | ORAL | Status: DC | PRN
  Filled 2013-09-19: qty 1

## 2013-09-19 MED ORDER — DOPAMINE-DEXTROSE 3.2-5 MG/ML-% IV SOLN
3.0000 ug/kg/min | INTRAVENOUS | Status: DC | PRN
Start: 2013-09-19 — End: 2013-09-20

## 2013-09-19 MED ORDER — MORPHINE SULFATE 2 MG/ML IJ SOLN: 2.0000 mg | INTRAMUSCULAR | Status: DC | PRN

## 2013-09-19 MED ORDER — HEPARIN SODIUM (PORCINE) 1000 UNIT/ML IJ SOLN
INTRAMUSCULAR | Status: DC | PRN
  Administered 2013-09-19: 6000 [IU] via INTRAVENOUS

## 2013-09-19 MED ORDER — ASPIRIN EC 81 MG PO TBEC
81.0000 mg | DELAYED_RELEASE_TABLET | Freq: Every day | ORAL | Status: DC
  Administered 2013-09-20: 81 mg via ORAL
  Filled 2013-09-19: qty 1

## 2013-09-19 MED ORDER — GUAIFENESIN-DM 100-10 MG/5ML PO SYRP: 15.0000 mL | ORAL_SOLUTION | ORAL | Status: DC | PRN

## 2013-09-19 MED ORDER — NEOSTIGMINE METHYLSULFATE 1 MG/ML IJ SOLN
INTRAMUSCULAR | Status: AC
  Filled 2013-09-19: qty 10

## 2013-09-19 MED ORDER — FENTANYL CITRATE 0.05 MG/ML IJ SOLN
INTRAMUSCULAR | Status: DC | PRN
  Administered 2013-09-19: 50 ug via INTRAVENOUS

## 2013-09-19 MED ORDER — GLYCOPYRROLATE 0.2 MG/ML IJ SOLN
INTRAMUSCULAR | Status: DC | PRN
  Administered 2013-09-19: 0.4 mg via INTRAVENOUS
  Administered 2013-09-19: 0.2 mg via INTRAVENOUS

## 2013-09-19 MED ORDER — OXYCODONE HCL 5 MG PO TABS: 5.0000 mg | ORAL_TABLET | Freq: Once | ORAL | Status: DC | PRN

## 2013-09-19 MED ORDER — ACETAMINOPHEN 325 MG PO TABS: 325.0000 mg | ORAL_TABLET | ORAL | Status: DC | PRN

## 2013-09-19 MED ORDER — HYDRALAZINE HCL 20 MG/ML IJ SOLN: 10.0000 mg | INTRAMUSCULAR | Status: DC | PRN

## 2013-09-19 MED ORDER — LIDOCAINE HCL (PF) 1 % IJ SOLN
INTRAMUSCULAR | Status: AC
  Filled 2013-09-19: qty 30

## 2013-09-19 MED ORDER — SODIUM CHLORIDE 0.9 % IV SOLN: 500.0000 mL | Freq: Once | INTRAVENOUS | Status: AC | PRN

## 2013-09-19 MED ORDER — ACETAMINOPHEN 650 MG RE SUPP: 325.0000 mg | RECTAL | Status: DC | PRN

## 2013-09-19 MED ORDER — OXYCODONE HCL 5 MG PO TABS
ORAL_TABLET | ORAL | Status: AC
  Administered 2013-09-19: 5 mg
  Filled 2013-09-19: qty 1

## 2013-09-19 MED ORDER — HYDROMORPHONE HCL PF 1 MG/ML IJ SOLN: 0.2500 mg | INTRAMUSCULAR | Status: DC | PRN

## 2013-09-19 MED ORDER — LIDOCAINE HCL (CARDIAC) 20 MG/ML IV SOLN
INTRAVENOUS | Status: DC | PRN
  Administered 2013-09-19: 60 mg via INTRAVENOUS

## 2013-09-19 MED ORDER — NIFEDIPINE ER 30 MG PO TB24
30.0000 mg | ORAL_TABLET | Freq: Every day | ORAL | Status: DC
  Filled 2013-09-19: qty 1

## 2013-09-19 MED ORDER — PROPOFOL 10 MG/ML IV BOLUS
INTRAVENOUS | Status: AC
  Filled 2013-09-19: qty 20

## 2013-09-19 MED ORDER — ADULT MULTIVITAMIN W/MINERALS CH
1.0000 | ORAL_TABLET | Freq: Every day | ORAL | Status: DC
  Filled 2013-09-19: qty 1

## 2013-09-19 MED ORDER — METOPROLOL SUCCINATE ER 100 MG PO TB24
100.0000 mg | ORAL_TABLET | Freq: Every day | ORAL | Status: DC
  Filled 2013-09-19: qty 1

## 2013-09-19 MED ORDER — DEXTROSE 5 % IV SOLN
1.5000 g | Freq: Two times a day (BID) | INTRAVENOUS | Status: AC
  Administered 2013-09-19 – 2013-09-20 (×2): 1.5 g via INTRAVENOUS
  Filled 2013-09-19 (×2): qty 1.5

## 2013-09-19 MED ORDER — SODIUM CHLORIDE 0.9 % IV SOLN
INTRAVENOUS | Status: DC
  Administered 2013-09-19: 12:00:00 via INTRAVENOUS

## 2013-09-19 MED ORDER — PANTOPRAZOLE SODIUM 40 MG PO TBEC
40.0000 mg | DELAYED_RELEASE_TABLET | Freq: Every day | ORAL | Status: DC
  Administered 2013-09-20: 40 mg via ORAL
  Filled 2013-09-19: qty 1

## 2013-09-19 MED ORDER — 0.9 % SODIUM CHLORIDE (POUR BTL) OPTIME
TOPICAL | Status: DC | PRN
  Administered 2013-09-19: 2000 mL

## 2013-09-19 MED ORDER — PHENOL 1.4 % MT LIQD: 1.0000 | OROMUCOSAL | Status: DC | PRN

## 2013-09-19 MED ORDER — LABETALOL HCL 5 MG/ML IV SOLN: 10.0000 mg | INTRAVENOUS | Status: DC | PRN

## 2013-09-19 MED ORDER — DOCUSATE SODIUM 100 MG PO CAPS
100.0000 mg | ORAL_CAPSULE | Freq: Every day | ORAL | Status: DC
  Administered 2013-09-20: 100 mg via ORAL
  Filled 2013-09-19: qty 1

## 2013-09-19 MED ORDER — FENTANYL CITRATE 0.05 MG/ML IJ SOLN
INTRAMUSCULAR | Status: AC
  Filled 2013-09-19: qty 5

## 2013-09-19 MED ORDER — SODIUM CHLORIDE 0.9 % IJ SOLN: 3.0000 mL | Freq: Two times a day (BID) | INTRAMUSCULAR | Status: DC

## 2013-09-19 MED ORDER — SODIUM CHLORIDE 0.9 % IR SOLN
Status: DC | PRN
  Administered 2013-09-19: 08:00:00

## 2013-09-19 MED ORDER — SODIUM CHLORIDE 0.9 % IJ SOLN: 3.0000 mL | INTRAMUSCULAR | Status: DC | PRN

## 2013-09-19 MED ORDER — MAGNESIUM SULFATE 40 MG/ML IJ SOLN: 2.0000 g | Freq: Every day | INTRAMUSCULAR | Status: DC | PRN

## 2013-09-19 MED ORDER — PROPOFOL 10 MG/ML IV BOLUS
INTRAVENOUS | Status: DC | PRN
Start: 2013-09-19 — End: 2013-09-19
  Administered 2013-09-19: 100 mg via INTRAVENOUS

## 2013-09-19 MED ORDER — LACTATED RINGERS IV SOLN
INTRAVENOUS | Status: DC | PRN
  Administered 2013-09-19: 08:00:00 via INTRAVENOUS

## 2013-09-19 SURGICAL SUPPLY — 44 items
CANISTER SUCTION 2500CC (MISCELLANEOUS) ×2 IMPLANT
CANNULA VESSEL 3MM 2 BLNT TIP (CANNULA) ×2 IMPLANT
CATH ROBINSON RED A/P 18FR (CATHETERS) ×2 IMPLANT
CLIP TI MEDIUM 6 (CLIP) ×2 IMPLANT
CLIP TI WIDE RED SMALL 6 (CLIP) ×2 IMPLANT
COVER SURGICAL LIGHT HANDLE (MISCELLANEOUS) ×2 IMPLANT
CRADLE DONUT ADULT HEAD (MISCELLANEOUS) ×2 IMPLANT
DECANTER SPIKE VIAL GLASS SM (MISCELLANEOUS) IMPLANT
DERMABOND ADVANCED (GAUZE/BANDAGES/DRESSINGS) ×1
DERMABOND ADVANCED .7 DNX12 (GAUZE/BANDAGES/DRESSINGS) ×1 IMPLANT
DRAIN HEMOVAC 1/8 X 5 (WOUND CARE) IMPLANT
DRAPE WARM FLUID 44X44 (DRAPE) ×2 IMPLANT
ELECT REM PT RETURN 9FT ADLT (ELECTROSURGICAL) ×2
ELECTRODE REM PT RTRN 9FT ADLT (ELECTROSURGICAL) ×1 IMPLANT
EVACUATOR SILICONE 100CC (DRAIN) IMPLANT
GEL ULTRASOUND 20GR AQUASONIC (MISCELLANEOUS) IMPLANT
GLOVE BIO SURGEON STRL SZ7.5 (GLOVE) ×2 IMPLANT
GLOVE BIOGEL PI IND STRL 8 (GLOVE) ×1 IMPLANT
GLOVE BIOGEL PI INDICATOR 8 (GLOVE) ×1
GOWN STRL REUS W/ TWL LRG LVL3 (GOWN DISPOSABLE) ×3 IMPLANT
GOWN STRL REUS W/TWL LRG LVL3 (GOWN DISPOSABLE) ×3
KIT BASIN OR (CUSTOM PROCEDURE TRAY) ×2 IMPLANT
KIT ROOM TURNOVER OR (KITS) ×2 IMPLANT
LOOP VESSEL MINI RED (MISCELLANEOUS) IMPLANT
NEEDLE HYPO 25GX1X1/2 BEV (NEEDLE) IMPLANT
NS IRRIG 1000ML POUR BTL (IV SOLUTION) ×4 IMPLANT
PACK CAROTID (CUSTOM PROCEDURE TRAY) ×2 IMPLANT
PAD ARMBOARD 7.5X6 YLW CONV (MISCELLANEOUS) ×4 IMPLANT
PATCH HEMASHIELD 8X75 (Vascular Products) ×2 IMPLANT
SHUNT CAROTID BYPASS 10 (VASCULAR PRODUCTS) ×2 IMPLANT
SHUNT CAROTID BYPASS 12FRX15.5 (VASCULAR PRODUCTS) IMPLANT
SPONGE SURGIFOAM ABS GEL 100 (HEMOSTASIS) IMPLANT
SUT ETHILON 3 0 PS 1 (SUTURE) IMPLANT
SUT PROLENE 6 0 CC (SUTURE) ×4 IMPLANT
SUT PROLENE 7 0 BV 1 (SUTURE) ×4 IMPLANT
SUT SILK 3 0 TIES 17X18 (SUTURE)
SUT SILK 3-0 18XBRD TIE BLK (SUTURE) IMPLANT
SUT VIC AB 3-0 SH 27 (SUTURE) ×1
SUT VIC AB 3-0 SH 27X BRD (SUTURE) ×1 IMPLANT
SUT VICRYL 4-0 PS2 18IN ABS (SUTURE) ×2 IMPLANT
SYR CONTROL 10ML LL (SYRINGE) IMPLANT
TOWEL OR 17X24 6PK STRL BLUE (TOWEL DISPOSABLE) ×2 IMPLANT
TOWEL OR 17X26 10 PK STRL BLUE (TOWEL DISPOSABLE) ×2 IMPLANT
WATER STERILE IRR 1000ML POUR (IV SOLUTION) ×2 IMPLANT

## 2013-09-19 NOTE — Progress Notes (Signed)
ANTIBIOTIC NOTE - FOLLOW UP  Pharmacy Consult for renal dose adjustment of IV antibiotics  No Known Allergies  Patient Measurements: Height: 5' (152.4 cm) Weight: 128 lb 12 oz (58.4 kg) IBW/kg (Calculated) : 45.5  Vital Signs: Temp: 97.7 F (36.5 C) (03/31 1646) Temp src: Oral (03/31 1646) BP: 107/38 mmHg (03/31 1646) Pulse Rate: 51 (03/31 1646) Intake/Output from previous day:   Intake/Output from this shift: Total I/O In: 360 [P.O.:60; I.V.:300] Out: 300 [Urine:250; Blood:50]  Labs:  Recent Labs  09/18/13 1320  WBC 5.2  HGB 9.3*  PLT 263  CREATININE 2.09*   Estimated Creatinine Clearance: 19.2 ml/min (by C-G formula based on Cr of 2.09).  Microbiology: Recent Results (from the past 720 hour(s))  SURGICAL PCR SCREEN     Status: None   Collection Time    09/18/13 12:59 PM      Result Value Ref Range Status   MRSA, PCR NEGATIVE  NEGATIVE Final   Staphylococcus aureus NEGATIVE  NEGATIVE Final   Comment:            The Xpert SA Assay (FDA     approved for NASAL specimens     in patients over 74 years of age),     is one component of     a comprehensive surveillance     program.  Test performance has     been validated by [employer] for patients greater     than or equal to 74 year old.     It is not intended     to diagnose infection nor to     guide or monitor treatment.    Anti-infectives   Start     Dose/Rate Route Frequency Ordered Stop   09/19/13 1800  cefUROXime (ZINACEF) 1.5 g in dextrose 5 % 50 mL IVPB     1.5 g 100 mL/hr over 30 Minutes Intravenous Every 12 hours 09/19/13 1721 09/20/13 1759   09/18/13 1424  cefUROXime (ZINACEF) 1.5 g in dextrose 5 % 50 mL IVPB  Status:  Discontinued     1.5 g 100 mL/hr over 30 Minutes Intravenous 30 min pre-op 09/18/13 1424 09/19/13 1640     Assessment: 73 yof who is s/p endarterectomy without noted complications.  She received one dose of IV cefuroxime around 16:30PM.  She has been scheduled to receive  2 additional doses of this as well.  She has some renal dysfunction with a creatinine of 2.09 and an estimated clearance of 64ml/min.  She had a urinalysis earlier today that revealed numerous WBC, cloudy appearance and many bacteria.  We have been asked to adjust her regimen based on renal function.  This antibiotic is typically dosed every 8 hours in patients with normal renal function, and thus has already been adjusted for her.  Plan:  1.  Continue current Cefuroxime 1.5 gm every 12hours x 2  Rober Minion, PharmD., MS Clinical Pharmacist Pager:  [phone] Thank you for allowing pharmacy to be part of this patients care team. 09/19/2013,6:33 PM

## 2013-09-19 NOTE — H&P (View-Only) (Signed)
[facility] [place] HISTORY AND PHYSICAL   History of Present Illness:  Brianna Coleman is a 73 y.o. year old female referred by Dr. Jannifer Franklin for evaluation of symptomatic right internal carotid artery stenosis. The Brianna Coleman had an event in mid January where she had left facial droop and garbled speech which lasted for approximately 24 hours. She has not had any events since then. She denies any history of amaurosis. She denies prior history of stroke. 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 She is on aspirin 81 mg daily. Other chronic medical problems include Parkinson's, hyperlipidemia, hypertension, arthritis, reflux, mild dysphasia secondary to Parkinson's, chronic renal insufficiency, history of heart murmur followed by [facility] clinic in [place] (Dr  Milagros Loll).  Past Medical History  Diagnosis Date  . Parkinson disease   . Dyslipidemia   . Hypertension   . Arthritis, degenerative   . Gastroesophageal reflux disease   . RLS (restless legs syndrome)   . Neurogenic bladder   . Dysphagia   . Chronic renal insufficiency   . Anemia   . Memory disturbance   . Episode of syncope     Near-syncope  . Gait disorder     Past Surgical History  Procedure Laterality Date  . Cataract extraction, bilateral    . Vaginal cyst removal  2009    Social History History  Substance Use Topics  . Smoking status: Never Smoker   . Smokeless tobacco: Never Used  . Alcohol Use: No    Family History Family History  Problem Relation Age of Onset  . Cancer Mother   . Hypertension Mother   . Heart disease Mother   . Hyperlipidemia Mother   . Heart attack Father   . Hypertension Father   . Diabetes Brother   . Diabetes Brother   . Heart disease Brother   . Hyperlipidemia Brother   . Hypertension Brother   . Varicose Veins Brother   . Hypertension Sister     Allergies  No Known Allergies   Current Outpatient Prescriptions  Medication Sig  Dispense Refill  . carbidopa-levodopa (SINEMET IR) 25-250 MG per tablet TAKE 1/2 A TABLET BY MOUTH 4 TIMES A DAY (MORNING,NOON,4 PM, AND NIGHT)  180 tablet  1  . darbepoetin (ARANESP) 100 MCG/0.5ML SOLN injection Inject 100 mcg as directed every 14 (fourteen) days.      . enalapril (VASOTEC) 10 MG tablet Take 5 mg by mouth daily.       Marland Kitchen HYDROcodone-acetaminophen (NORCO/VICODIN) 5-325 MG per tablet Take 1 tablet by mouth every 6 (six) hours as needed.  90 tablet  0  . metoprolol succinate (TOPROL-XL) 100 MG 24 hr tablet Take 100 mg by mouth daily.      . Multiple Vitamin (MULTIVITAMIN) tablet Take 1 tablet by mouth daily.      Marland Kitchen NIFEdipine (PROCARDIA-XL/ADALAT CC) 30 MG 24 hr tablet Take 30 mg by mouth daily.      . pramipexole (MIRAPEX) 1 MG tablet Take 1 tablet (1 mg total) by mouth 3 (three) times daily.  270 tablet  3  . aspirin 81 MG tablet Take 81 mg by mouth daily.      Marland Kitchen epoetin alfa (EPOGEN,PROCRIT) 2000 UNIT/ML injection Inject 40,000 Units into the skin once. Once monthly       No current facility-administered medications for this visit.    ROS:   General:  No weight loss, Fever, chills  HEENT: No recent headaches, no nasal bleeding, no  visual changes, no sore throat  Neurologic: No dizziness, blackouts, seizures. No recent symptoms of stroke or mini- stroke. No recent episodes of slurred speech, or temporary blindness.  Cardiac: No recent episodes of chest pain/pressure, no shortness of breath at rest.  + shortness of breath with exertion.  Denies history of atrial fibrillation or irregular heartbeat  Vascular: No history of rest pain in feet.  No history of claudication.  No history of non-healing ulcer, No history of DVT   Pulmonary: No home oxygen, no productive cough, no hemoptysis,  + asthma or wheezing  Musculoskeletal:  [ x] Arthritis, [ ]  Low back pain,  [ ]  Joint pain  Hematologic:No history of hypercoagulable state.  No history of easy bleeding.  No history of  anemia  Gastrointestinal: No hematochezia or melena,  + gastroesophageal reflux, + trouble swallowing  Urinary: [x ] chronic Kidney disease, [ ]  on HD - [ ]  MWF or [ ]  TTHS, [ ]  Burning with urination, [ ]  Frequent urination, [ ]  Difficulty urinating;   Skin: No rashes  Psychological: No history of anxiety,  No history of depression   Physical Examination  Filed Vitals:   09/14/13 1334  BP: 98/41  Pulse: 67  Resp: 18  Height: 5' (1.524 m)  Weight: 126 lb 4.8 oz (57.289 kg)    Body mass index is 24.67 kg/(m^2).  General:  Alert and oriented, no acute distress HEENT: Normal Neck: No bruit or JVD Pulmonary: Clear to auscultation bilaterally Cardiac: Regular Rate and Rhythm with 3 or 6 systolic murmur heard best in the right second interspace but throughout the precordium Abdomen: Soft, non-tender, non-distended, no mass Skin: No rash Extremity Pulses:  2+ radial, brachial, femoral  pulses bilaterally Musculoskeletal: No deformity or edema  Neurologic: Upper and lower extremity motor 5/5 and symmetric  DATA:  I reviewed her previous carotid duplex exam from [facility] neurology dated 08/23/2013. Shows a greater than 70% stenosis of the right internal carotid artery with velocities of 360/90 left side had no significant stenosis. We repeated her right carotid duplex today in our office for operative planning purposes. This showed a peak systolic velocity of 144/81 cm/s. I reviewed and interpreted the findings.    ASSESSMENT:  Greater than 70% stenosis right internal carotid artery symptomatic with recent TIA   PLAN:  Right carotid endarterectomy Tuesday, March 31. Risks benefits possible complications and procedure details including but not limited to bleeding infection stroke cranial nerve injury were discussed with the Brianna Coleman and her daughter today. Understand agree to proceed.  Ruta Hinds, MD [facility] [place] Office: [phone] Pager:  [phone]

## 2013-09-19 NOTE — Interval H&P Note (Signed)
History and Physical Interval Note:  09/19/2013 8:29 AM  Brianna Coleman  has presented today for surgery, with the diagnosis of Carotid stenosis  The various methods of treatment have been discussed with the patient and family. After consideration of risks, benefits and other options for treatment, the patient has consented to  Procedure(s): ENDARTERECTOMY CAROTID WITH PATCH ANGIOPLASTY (Right) as a surgical intervention .  The patient's history has been reviewed, patient examined, no change in status, stable for surgery.  I have reviewed the patient's chart and labs.  Questions were answered to the patient's satisfaction.     FIELDS,CHARLES E

## 2013-09-19 NOTE — Progress Notes (Signed)
Pt awake and alert in PACU No hematoma Neuro intact  To 3S when bed available  Ruta Hinds, MD [facility] [place]: [phone] Pager: [phone]

## 2013-09-19 NOTE — Anesthesia Postprocedure Evaluation (Signed)
  Anesthesia Post-op Note  Patient: Brianna Coleman  Procedure(s) Performed: Procedure(s): ENDARTERECTOMY CAROTID WITH PATCH ANGIOPLASTY (Right)  Patient Location: PACU  Anesthesia Type:General  Level of Consciousness: awake and alert   Airway and Oxygen Therapy: Patient Spontanous Breathing  Post-op Pain: mild  Post-op Assessment: Post-op Vital signs reviewed, Patient's Cardiovascular Status Stable, Respiratory Function Stable, Patent Airway, No signs of Nausea or vomiting and Pain level controlled  Post-op Vital Signs: Reviewed and stable  Complications: No apparent anesthesia complications

## 2013-09-19 NOTE — Transfer of Care (Signed)
Immediate Anesthesia Transfer of Care Note  Patient: Brianna Coleman  Procedure(s) Performed: Procedure(s): ENDARTERECTOMY CAROTID WITH PATCH ANGIOPLASTY (Right)  Patient Location: PACU  Anesthesia Type:General  Level of Consciousness: awake, alert  and oriented  Airway & Oxygen Therapy: Patient Spontanous Breathing and Patient connected to nasal cannula oxygen  Post-op Assessment: Report given to PACU RN  Post vital signs: Reviewed  Complications: No apparent anesthesia complications

## 2013-09-19 NOTE — Anesthesia Procedure Notes (Signed)
Procedure Name: Intubation Date/Time: 09/19/2013 8:55 AM Performed by: Manuela Schwartz B Pre-anesthesia Checklist: Patient identified, Emergency Drugs available, Suction available, Patient being monitored and Timeout performed Patient Re-evaluated:Patient Re-evaluated prior to inductionOxygen Delivery Method: Circle system utilized Preoxygenation: Pre-oxygenation with 100% oxygen Intubation Type: IV induction Ventilation: Mask ventilation without difficulty Laryngoscope Size: Mac and 3 Grade View: Grade I Tube type: Oral Tube size: 7.5 mm Number of attempts: 1 Airway Equipment and Method: Stylet Placement Confirmation: ETT inserted through vocal cords under direct vision,  positive ETCO2 and breath sounds checked- equal and bilateral Secured at: 21 cm Tube secured with: Tape Dental Injury: Teeth and Oropharynx as per pre-operative assessment

## 2013-09-19 NOTE — Anesthesia Preprocedure Evaluation (Signed)
Anesthesia Evaluation  Patient identified by MRN, date of birth, ID band Patient awake    Reviewed: Allergy & Precautions, H&P , NPO status , Patient's Chart, lab work & pertinent test results  Airway Mallampati: I TM Distance: <3 FB Neck ROM: Full    Dental   Pulmonary  breath sounds clear to auscultation        Cardiovascular hypertension, + Valvular Problems/Murmurs AS Rhythm:Regular Rate:Normal     Neuro/Psych Memory problems Parkinsonism CVA    GI/Hepatic GERD-  ,dysphagia   Endo/Other    Renal/GU Renal InsufficiencyRenal disease     Musculoskeletal   Abdominal   Peds  Hematology  (+) anemia ,   Anesthesia Other Findings   Reproductive/Obstetrics                           Anesthesia Physical Anesthesia Plan  ASA: IV  Anesthesia Plan: General   Post-op Pain Management:    Induction: Intravenous  Airway Management Planned: Oral ETT  Additional Equipment: Arterial line  Intra-op Plan:   Post-operative Plan: Extubation in OR  Informed Consent: I have reviewed the patients History and Physical, chart, labs and discussed the procedure including the risks, benefits and alternatives for the proposed anesthesia with the patient or authorized representative who has indicated his/her understanding and acceptance.     Plan Discussed with: CRNA and Surgeon  Anesthesia Plan Comments:         Anesthesia Quick Evaluation

## 2013-09-19 NOTE — Op Note (Signed)
Procedure Right carotid endarterectomy  Preoperative diagnosis: High-grade symptomatic right internal carotid artery stenosis  Postoperative diagnosis: Same  Anesthesia General  Asst.: Carlyn Reichert, RNFA  Operative findings: #1 greater than 80% right internal carotid stenosis, focal stenosis                                                             #2 Dacron patch           #3 10 Fr pediatric shunt  Operative details: After obtaining informed consent, the patient was taken to the operating room. The patient was placed in a supine position on the operating room table. After induction of general anesthesia and endotracheal intubation a Foley catheter was placed. Next the patient's entire neck and chest was prepped and draped in the usual sterile fashion. An oblique incision was made on the right aspect of the patient's neck anterior to the border the right sternocleidomastoid muscle. The incision was carried into the subcutaneous tissues and through the platysma. The sternocleidomastoid muscle was identified and reflected laterally. The omohyoid muscle was identified and this was divided with cautery. The common carotid artery was then found at the base of the incision this was dissected free circumferentially. It was fairly soft on palpation.  The vagus nerve was identified and protected. Dissection was then carried up to the level carotid bifurcation.   The hyperglossal nerve was well above the primary area of dissection. The internal carotid artery was dissected free circumferentially just below the level of the hypoglossal nerve and it was soft in character at this location and above any palpable disease. A vessel loop was placed around this. The vessel was fairly small. Next the external carotid and superior thyroid arteries were dissected free circumferentially and vessel loops were placed around these. The patient was given 7000 units of intravenous heparin.  After 2 minutes of circulation time  and raising the mean arterial pressure to 90 mm mercury, the distal internal carotid artery was controlled with small bulldog clamp. The external carotid and superior thyroid arteries were controlled with vessel loops. The common carotid artery was controlled with a peripheral DeBakey clamp. A longitudinal opening was made in the common carotid artery just below the bifurcation. The arteriotomy was extended distally up into the internal carotid with Potts scissors. There was a large calcified plaque with greater than 80% stenosis in the internal carotid.  This was a small vessel.   A 10 Fr shunt was brought onto the field.  This was threaded into the distal internal carotid artery and allowed to backbleed thoroughly.  Backbleeding was fairly sluggish.   The shunt was then threaded into the common carotid and secured with a Rummel tourniquet.  There was no air at this point and flow was restored to the brain.  Attention was then turned to the common carotid artery once again. A suitable endarterectomy plane was obtained and endarterectomy was begun in the common carotid artery and a good proximal endpoint was obtained. An eversion endarterectomy was performed on the external carotid artery and a good endpoint was obtained. The plaque was then elevated in the internal carotid artery and a nice feathered distal endpoint was also obtained.  The plaque was passed off the table. All loose debris was then removed from the carotid  bed and everything was thoroughly irrigated with heparinized saline. Two 7 0 prolenes were used to tack the distal endpoint.  A Dacron patch was then brought on to the operative field and this was sewn on as a patch angioplasty using a running 6-0 Prolene suture. Prior to completion of the anastomosis the internal carotid artery was thoroughly backbled. This was then controlled again with a fine bulldog clamp.  The common carotid was thoroughly flushed forward. The external carotid was also  thoroughly backbled.  The remainder of the patch was completed and the anastomosis was secured. Flow was then restored first retrograde from the external carotid into the carotid bed then antegrade from the common carotid to the external carotid artery and after approximately 5 cardiac cycles to the internal carotid artery. Doppler was used to evaluate the external/internal and common carotid arteries and these all had good Doppler flow. Hemostasis was obtained with 1 additional repair suture. The patient was also given 60 mg of Protamine.      The platysma muscle was reapproximated using a running 3-0 Vicryl suture. The skin was closed with 4 0 Vicryl subcuticular stitch.  The patient was awakened in the operating room and was moving upper and lower extremities symmetrically and following commands.  The patient was stable on arrival to the PACU.  Ruta Hinds, MD [facility] [place] Office: [phone] Pager: [phone]

## 2013-09-20 ENCOUNTER — Telehealth: Payer: Self-pay | Admitting: Vascular Surgery

## 2013-09-20 ENCOUNTER — Encounter (HOSPITAL_COMMUNITY): Payer: Self-pay | Admitting: Vascular Surgery

## 2013-09-20 LAB — CBC
HCT: 27 % — ABNORMAL LOW (ref 36.0–46.0)
HEMOGLOBIN: 8.4 g/dL — AB (ref 12.0–15.0)
MCH: 27 pg (ref 26.0–34.0)
MCHC: 31.1 g/dL (ref 30.0–36.0)
MCV: 86.8 fL (ref 78.0–100.0)
PLATELETS: 212 10*3/uL (ref 150–400)
RBC: 3.11 MIL/uL — AB (ref 3.87–5.11)
RDW: 24.8 % — ABNORMAL HIGH (ref 11.5–15.5)
WBC: 3.9 10*3/uL — ABNORMAL LOW (ref 4.0–10.5)

## 2013-09-20 LAB — BASIC METABOLIC PANEL
BUN: 34 mg/dL — ABNORMAL HIGH (ref 6–23)
CALCIUM: 8.4 mg/dL (ref 8.4–10.5)
CO2: 21 meq/L (ref 19–32)
Chloride: 106 mEq/L (ref 96–112)
Creatinine, Ser: 2.16 mg/dL — ABNORMAL HIGH (ref 0.50–1.10)
GFR calc Af Amer: 25 mL/min — ABNORMAL LOW (ref >90)
GFR, EST NON AFRICAN AMERICAN: 21 mL/min — AB (ref >90)
GLUCOSE: 85 mg/dL (ref 70–99)
Potassium: 4.4 mEq/L (ref 3.7–5.3)
SODIUM: 139 meq/L (ref 137–147)

## 2013-09-20 MED ORDER — HYDROCODONE-ACETAMINOPHEN 5-325 MG PO TABS: 1.0000 | ORAL_TABLET | Freq: Four times a day (QID) | ORAL | Status: DC | PRN

## 2013-09-20 MED ORDER — CARBIDOPA-LEVODOPA 25-250 MG PO TABS
0.5000 | ORAL_TABLET | Freq: Four times a day (QID) | ORAL | Status: DC
  Administered 2013-09-20: 0.5 via ORAL
  Filled 2013-09-20 (×4): qty 1

## 2013-09-20 NOTE — Progress Notes (Signed)
DC instructions and education given to patient and daughter. Questions answered. VSS. eICU and CCMT notified of DC. Rx given to patient. Instructed to hold all BP meds today including metoprolol per Dr. Oneida Alar. R wrist PIV DC, hemostasis achieved. Pt escorted by NT via wheelchair to private vehicle driven by daughter.

## 2013-09-20 NOTE — Telephone Encounter (Addendum)
Message copied by Gena Fray on Wed Sep 20, 2013  9:18 AM ------      Message from: Mena Goes      Created: Tue Sep 19, 2013 11:03 AM      Regarding: schedule                   ----- Message -----         From: Gabriel Earing, PA-C         Sent: 09/19/2013  10:30 AM           To: Vvs Charge Pool            S/p right CEA 09/19/13.  F/u with Dr. Oneida Alar in 2 weeks.            Thanks,      Samantha ------  09/20/13: spoke with pts daughter, dpm

## 2013-09-20 NOTE — Progress Notes (Signed)
Physician notified: Aldona Bar, PA At: 1035  Regarding: BP 89/40 HR 63. Held AM BP meds. Continue with DC? Awaiting return response.   Returned Response at: 1035  Order(s): Hold DC at this time, plan this afternoon.

## 2013-09-20 NOTE — Discharge Summary (Signed)
[facility] Discharge Summary  Brianna Coleman January 29, 1941 73 y.o. female  [id number]  Admission Date: 09/19/2013  Discharge Date: 09/20/13  Physician: No att. providers found  Admission Diagnosis: Carotid stenosis   HPI:   This is a 73 y.o. female referred by Dr. Jannifer Franklin for evaluation of symptomatic right internal carotid artery stenosis. The patient had an event in mid January where she had left facial droop and garbled speech which lasted for approximately 24 hours. She has not had any events since then. She denies any history of amaurosis. She denies prior history of stroke. 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 She is on aspirin 81 mg daily. Other chronic medical problems include Parkinson's, hyperlipidemia, hypertension, arthritis, reflux, mild dysphasia secondary to Parkinson's, chronic renal insufficiency, history of heart murmur followed by [facility] clinic in [place] (Dr Milagros Loll).  Hospital Course:  The patient was admitted to the hospital and taken to the operating room on 09/19/2013 and underwent right carotid endarterectomy.  The pt tolerated the procedure well and was transported to the PACU in good condition.   By POD 1, the pt neuro status was in tact.  She did have some postoperative bradycardia, but this improved by the morning.   She does have some renal insufficiency and will have her f/u with her PCP to have blood work checked in 3-4 weeks as she is on an ACEI.    She did have acute surgical blood loss anemia and did tolerate this.  Will have her get a CBC also when she follows up with her PCP.  She is not on a statin.  Dr. Oneida Alar felt it would be best left to her PCP to decide whether or not to put her on a statin given her multiple medical conditions/medications.  The remainder of the hospital course consisted of increasing mobilization and increasing intake of solids without difficulty.    Recent Labs  09/18/13 1320 09/20/13 0400  NA 137 139  K 5.0 4.4  CL 103 106  CO2 21 21  GLUCOSE 86 85  BUN 37* 34*  CALCIUM 8.7 8.4    Recent Labs  09/18/13 1320 09/20/13 0400  WBC 5.2 3.9*  HGB 9.3* 8.4*  HCT 30.3* 27.0*  PLT 263 212    Recent Labs  09/18/13 1320  INR 0.99    Discharge Instructions:   The patient is discharged to home with extensive instructions on wound care and progressive ambulation.  They are instructed not to drive or perform any heavy lifting until returning to see the physician in his office.      Discharge Orders   Future Appointments Provider Department Dept Phone   10/05/2013 3:45 PM Elam Dutch, MD [facility] [phone]   03/05/2014 9:00 AM Kathrynn Ducking, MD [facility] [phone]   Future Orders Complete By Expires   Call MD for:  redness, tenderness, or signs of infection (pain, swelling, bleeding, redness, odor or green/yellow discharge around incision site)  As directed    Call MD for:  severe or increased pain, loss or decreased feeling  in affected limb(s)  As directed    Call MD for:  temperature >100.5  As directed    CAROTID Sugery: Call MD for difficulty swallowing or speaking; weakness in arms or legs that is a new symtom; severe headache.  If you have increased swelling in the neck and/or  are having difficulty breathing, CALL 911  As  directed    Discharge wound care:  As directed    Comments:     Shower daily with soap and water starting 09/21/13   Driving Restrictions  As directed    Comments:     No driving for 2 weeks   Lifting restrictions  As directed    Comments:     No lifting for 2 weeks   Resume previous diet  As directed       Discharge Diagnosis:  Carotid stenosis  Secondary Diagnosis: Patient Active Problem List   Diagnosis Date Noted  . Carotid artery stenosis 09/19/2013  . Occlusion and stenosis of carotid artery without mention of cerebral infarction  09/14/2013  . Paralysis agitans 10/04/2012  . Abnormality of gait 10/04/2012  . Memory loss 06/21/2012  . Dysphagia, unspecified 06/21/2012  . Unspecified essential hypertension 06/21/2012  . Other and unspecified hyperlipidemia 06/21/2012  . Unspecified cataract 06/21/2012   Past Medical History  Diagnosis Date  . Parkinson disease   . Dyslipidemia   . Hypertension   . Arthritis, degenerative   . Gastroesophageal reflux disease   . RLS (restless legs syndrome)   . Neurogenic bladder   . Dysphagia   . Anemia   . Memory disturbance   . Episode of syncope     Near-syncope  . Gait disorder   . Heart murmur   . Stroke     no residual  . Shortness of breath     with exertion  . Blood in stool   . History of blood transfusion   . Aortic stenosis     mild AS by 03/20/13 echo [facility])  . Chronic renal insufficiency     CKD stage IV, sees Dr Holley Raring in [facility]      Medication List         aspirin 81 MG tablet  Take 81 mg by mouth daily.     carbidopa-levodopa 25-250 MG per tablet  Commonly known as:  SINEMET IR  TAKE 1/2 A TABLET BY MOUTH 4 TIMES A DAY (MORNING,NOON,4 PM, AND NIGHT)     enalapril 2.5 MG tablet  Commonly known as:  VASOTEC  Take 2.5 mg by mouth daily.     HYDROcodone-acetaminophen 5-325 MG per tablet  Commonly known as:  NORCO/VICODIN  Take 1 tablet by mouth every 6 (six) hours as needed for moderate pain.     metoprolol succinate 100 MG 24 hr tablet  Commonly known as:  TOPROL-XL  Take 100 mg by mouth daily.     multivitamin tablet  Take 1 tablet by mouth daily.     NIFEdipine 30 MG 24 hr tablet  Commonly known as:  PROCARDIA-XL/ADALAT CC  Take 30 mg by mouth daily.     pramipexole 1 MG tablet  Commonly known as:  MIRAPEX  Take 1 tablet (1 mg total) by mouth 3 (three) times daily.        Vicodin #30 No Refill  Disposition: home  Patient's condition: is Good  Follow up: 1. Dr.  Oneida Alar in 2 weeks. 2. Dr. Jacqualine Code in  3-4 weeks to check BMP (renal function) and CBC  Leontine Locket, PA-C [facility] [phone]  --- For [facility] use --- Instructions: Press F2 to tab through selections.  Delete question if not applicable.   Modified Rankin score at D/C (0-6): 1  IV medication needed for:  1. Hypertension: No 2. Hypotension: No  Post-op Complications: No  1. Post-op CVA or TIA: No  If yes: Event classification (right eye, left eye, right cortical, left cortical, verterobasilar, other): n/a  If yes: Timing of event (intra-op, <6 hrs post-op, >=6 hrs post-op, unknown): n/a  2. CN injury: No  If yes: CN n/a injuried   3. Myocardial infarction: No  If yes: Dx by (EKG or clinical, Troponin): n/a  4.  CHF: No  5.  Dysrhythmia (new): No  6. Wound infection: No  7. Reperfusion symptoms: No  8. Return to OR: No  If yes: return to OR for (bleeding, neurologic, other CEA incision, other): n/a  Discharge medications: Statin use:  No If No: [x ] For Medical reasons, [ ]  Non-compliant, [ ]  Not-indicated ASA use:  Yes  If No: [ ]  For Medical reasons, [ ]  Non-compliant, [ ]  Not-indicated Beta blocker use:  Yes If No: [ ]  For Medical reasons, [ ]  Non-compliant, [ ]  Not-indicated ACE-Inhibitor use:  Yes If No: [ ]  For Medical reasons, [ ]  Non-compliant, [ ]  Not-indicated P2Y12 Antagonist use: No, [ ]  Plavix, [ ]  Plasugrel, [ ]  Ticlopinine, [ ]  Ticagrelor, [ ]  Other, [ ]  No for medical reason, [ ]  Non-compliant, [ ]  Not-indicated Anti-coagulant use:  No, [ ]  Warfarin, [ ]  Rivaroxaban, [ ]  Dabigatran, [ ]  Other, [ ]  No for medical reason, [ ]  Non-compliant, [ ]  Not-indicated

## 2013-09-20 NOTE — Progress Notes (Addendum)
[facility] Progress Note  09/20/2013 7:26 AM 1 Day Post-Op  Subjective:  No complaints  Afebrile HR 40's-60's 70'J-628'Z systolic 66% RA  Filed Vitals:   09/20/13 0350  BP: 116/45  Pulse: 51  Temp: 98 F (36.7 C)  Resp: 14     Physical Exam: Neuro:  In tact Incision:  C/d/i  CBC    Component Value Date/Time   WBC 3.9* 09/20/2013 0400   RBC 3.11* 09/20/2013 0400   HGB 8.4* 09/20/2013 0400   HCT 27.0* 09/20/2013 0400   PLT 212 09/20/2013 0400   MCV 86.8 09/20/2013 0400   MCH 27.0 09/20/2013 0400   MCHC 31.1 09/20/2013 0400   RDW 24.8* 09/20/2013 0400    BMET    Component Value Date/Time   NA 139 09/20/2013 0400   K 4.4 09/20/2013 0400   CL 106 09/20/2013 0400   CO2 21 09/20/2013 0400   GLUCOSE 85 09/20/2013 0400   BUN 34* 09/20/2013 0400   CREATININE 2.16* 09/20/2013 0400   CALCIUM 8.4 09/20/2013 0400   GFRNONAA 21* 09/20/2013 0400   GFRAA 25* 09/20/2013 0400     Intake/Output Summary (Last 24 hours) at 09/20/13 0726 Last data filed at 09/20/13 0358  Gross per 24 hour  Intake    360 ml  Output    650 ml  Net   -290 ml      Assessment/Plan:  This is a 73 y.o. female who is s/p right CEA 1 Day Post-Op  -pt is doing well this am.  She did have bradycardia yesterday with the lowest being in the 30's last pm, but not sustained per RN.  She has been in the 50's most of the night and is low 60's this am.  Will have pt hold BB today. -acute surgical blood loss anemia-pt tolerating and is not on any gtts for BP support -pt's BUN/Cr stable from pre-op labs-may need to f/u with PCP in 3-4 weeks to check renal function and CBC.  She is on a low dose ACEI -pt neuro exam is in tact -pt has not ambulated-RN will ambulate with pt this am. -pt has voided -home later this am.   Leontine Locket, PA-C [facility] [phone]   Overall looks well No hematoma Neuro intact Some Bradycardia yesterday but HR reasonable now Will d/c home later today  Ruta Hinds, MD [facility] [place]: [phone] Pager: [phone]

## 2013-09-20 NOTE — Progress Notes (Signed)
Utilization review completed.  

## 2013-09-20 NOTE — Progress Notes (Signed)
Physician notified: Fields At: 1317  Regarding: BP 94/38 (MAP 48) HR 63 Asymptomatic. Want BP meds held today. Awaiting return response.   Returned Response at: 1317  Order(s): OK to DC if not dizzy when standing. No BP meds today.

## 2013-10-04 ENCOUNTER — Encounter: Payer: Self-pay | Admitting: Vascular Surgery

## 2013-10-05 ENCOUNTER — Ambulatory Visit (INDEPENDENT_AMBULATORY_CARE_PROVIDER_SITE_OTHER): Payer: Medicare Other | Admitting: Vascular Surgery

## 2013-10-05 ENCOUNTER — Encounter: Payer: Self-pay | Admitting: Vascular Surgery

## 2013-10-05 VITALS — BP 191/95 | HR 76 | Ht 60.0 in | Wt 126.4 lb

## 2013-10-05 DIAGNOSIS — I6529 Occlusion and stenosis of unspecified carotid artery: Secondary | ICD-10-CM

## 2013-10-05 DIAGNOSIS — Z48812 Encounter for surgical aftercare following surgery on the circulatory system: Secondary | ICD-10-CM

## 2013-10-05 NOTE — Progress Notes (Signed)
[facility] [place] HISTORY AND PHYSICAL    History of Present Illness:  Patient is a 73 y.o. year old female who presents for post-operative follow-up after right carotid endarterectomy.  Denies headaches, numbness, tingling or other neuro deficits.  No swallowing problems.  No incisional drainage.  Her friend accompanying her today was complaining of bilateral lower extremity leg edema. The leg swelling has been present since her operation. The patient's friend states that it has been slowly progressively worse. They apparently brought this up with her nephrologist Dr. Holley Raring a few days ago. He apparently did not want to place her on any diuretic at that time. Dr. Holley Raring is also managing her blood pressure medications which were stopped due to hypotension in the hospital. I explained to the patient's friend today that some of the leg swelling is probably due to extra IV fluids she received in the hospital during her operation as well as postoperatively for blood pressure support.   Physical Examination  Filed Vitals:   10/05/13 1626  BP: 191/95  Pulse:     Body mass index is 24.69 kg/(m^2).  General:  Alert and oriented, no acute distress Neck: No bruit or JVD, healing right neck incision Skin: No rash Extremities:  2+ edema pretibial bilaterally Neurologic: Upper and lower extremity motor 5/5 and symmetric  ASSESSMENT: Doing well post carotid endarterectomy. Bilateral leg edema most likely multifactorial from IV fluids protein calorie malnutrition venous incompetence renal dysfunction and cardiac dysfunction.   PLAN:  The patient was given a prescription today for lower extremity compression stockings. I encouraged the patient's friend to consider returning to her nephrologist he increasing edema. I offered to call the nephrology office for her today but she did not want me to do this. Patient will followup with me in 6 months time for repeat carotid duplex exam.  She will continue her aspirin.   Ruta Hinds, MD [facility] [place] Office: [phone] Pager: [phone]

## 2013-10-09 NOTE — Addendum Note (Signed)
Addended by: Dorthula Rue L on: 10/09/2013 02:38 PM   Modules accepted: Orders

## 2013-11-20 ENCOUNTER — Other Ambulatory Visit: Payer: Self-pay | Admitting: Neurology

## 2013-11-22 ENCOUNTER — Inpatient Hospital Stay: Payer: Self-pay | Admitting: Internal Medicine

## 2013-11-22 LAB — CBC WITH DIFFERENTIAL/PLATELET
BASOS ABS: 0 10*3/uL (ref 0.0–0.1)
Basophil %: 0.5 %
EOS ABS: 0 10*3/uL (ref 0.0–0.7)
Eosinophil %: 0.3 %
HCT: 27.6 % — AB (ref 35.0–47.0)
HGB: 8.8 g/dL — AB (ref 12.0–16.0)
Lymphocyte #: 0.6 10*3/uL — ABNORMAL LOW (ref 1.0–3.6)
Lymphocyte %: 10 %
MCH: 27.2 pg (ref 26.0–34.0)
MCHC: 31.9 g/dL — ABNORMAL LOW (ref 32.0–36.0)
MCV: 85 fL (ref 80–100)
MONOS PCT: 8.4 %
Monocyte #: 0.5 x10 3/mm (ref 0.2–0.9)
NEUTROS PCT: 80.8 %
Neutrophil #: 4.8 10*3/uL (ref 1.4–6.5)
PLATELETS: 287 10*3/uL (ref 150–440)
RBC: 3.23 10*6/uL — ABNORMAL LOW (ref 3.80–5.20)
RDW: 17.4 % — ABNORMAL HIGH (ref 11.5–14.5)
WBC: 6 10*3/uL (ref 3.6–11.0)

## 2013-11-22 LAB — BASIC METABOLIC PANEL
ANION GAP: 6 — AB (ref 7–16)
BUN: 57 mg/dL — ABNORMAL HIGH (ref 7–18)
CHLORIDE: 106 mmol/L (ref 98–107)
Calcium, Total: 8.9 mg/dL (ref 8.5–10.1)
Co2: 23 mmol/L (ref 21–32)
Creatinine: 3.13 mg/dL — ABNORMAL HIGH (ref 0.60–1.30)
EGFR (African American): 16 — ABNORMAL LOW
EGFR (Non-African Amer.): 14 — ABNORMAL LOW
GLUCOSE: 100 mg/dL — AB (ref 65–99)
OSMOLALITY: 286 (ref 275–301)
POTASSIUM: 5 mmol/L (ref 3.5–5.1)
Sodium: 135 mmol/L — ABNORMAL LOW (ref 136–145)

## 2013-11-22 LAB — URINALYSIS, COMPLETE
BILIRUBIN, UR: NEGATIVE
Glucose,UR: NEGATIVE mg/dL (ref 0–75)
Ketone: NEGATIVE
NITRITE: NEGATIVE
Ph: 5 (ref 4.5–8.0)
Protein: 100
SQUAMOUS EPITHELIAL: NONE SEEN
Specific Gravity: 1.008 (ref 1.003–1.030)
WBC UR: 1640 /HPF (ref 0–5)

## 2013-11-23 LAB — IRON AND TIBC
IRON BIND. CAP.(TOTAL): 136 ug/dL — AB (ref 250–450)
IRON: 22 ug/dL — AB (ref 50–170)
Iron Saturation: 16 %
Unbound Iron-Bind.Cap.: 114 ug/dL

## 2013-11-23 LAB — BASIC METABOLIC PANEL
ANION GAP: 8 (ref 7–16)
BUN: 51 mg/dL — ABNORMAL HIGH (ref 7–18)
Calcium, Total: 8.4 mg/dL — ABNORMAL LOW (ref 8.5–10.1)
Chloride: 108 mmol/L — ABNORMAL HIGH (ref 98–107)
Co2: 20 mmol/L — ABNORMAL LOW (ref 21–32)
Creatinine: 2.74 mg/dL — ABNORMAL HIGH (ref 0.60–1.30)
EGFR (African American): 19 — ABNORMAL LOW
EGFR (Non-African Amer.): 17 — ABNORMAL LOW
GLUCOSE: 86 mg/dL (ref 65–99)
Osmolality: 285 (ref 275–301)
POTASSIUM: 4.4 mmol/L (ref 3.5–5.1)
Sodium: 136 mmol/L (ref 136–145)

## 2013-11-23 LAB — MAGNESIUM: MAGNESIUM: 2.2 mg/dL

## 2013-11-23 LAB — TSH: THYROID STIMULATING HORM: 2.32 u[IU]/mL

## 2013-11-23 LAB — FERRITIN: Ferritin (ARMC): 100 ng/mL (ref 8–388)

## 2013-11-24 LAB — BASIC METABOLIC PANEL
ANION GAP: 8 (ref 7–16)
BUN: 48 mg/dL — ABNORMAL HIGH (ref 7–18)
CHLORIDE: 113 mmol/L — AB (ref 98–107)
CO2: 20 mmol/L — AB (ref 21–32)
Calcium, Total: 8.3 mg/dL — ABNORMAL LOW (ref 8.5–10.1)
Creatinine: 2.53 mg/dL — ABNORMAL HIGH (ref 0.60–1.30)
EGFR (African American): 21 — ABNORMAL LOW
EGFR (Non-African Amer.): 18 — ABNORMAL LOW
GLUCOSE: 84 mg/dL (ref 65–99)
Osmolality: 293 (ref 275–301)
POTASSIUM: 4.2 mmol/L (ref 3.5–5.1)
Sodium: 141 mmol/L (ref 136–145)

## 2013-11-24 LAB — URINE CULTURE

## 2013-11-27 ENCOUNTER — Emergency Department: Payer: Self-pay | Admitting: Emergency Medicine

## 2013-11-27 LAB — COMPREHENSIVE METABOLIC PANEL
ALBUMIN: 2.8 g/dL — AB (ref 3.4–5.0)
ALK PHOS: 75 U/L
ANION GAP: 7 (ref 7–16)
BUN: 38 mg/dL — ABNORMAL HIGH (ref 7–18)
Bilirubin,Total: 0.3 mg/dL (ref 0.2–1.0)
CREATININE: 2.33 mg/dL — AB (ref 0.60–1.30)
Calcium, Total: 8.8 mg/dL (ref 8.5–10.1)
Chloride: 111 mmol/L — ABNORMAL HIGH (ref 98–107)
Co2: 22 mmol/L (ref 21–32)
EGFR (African American): 23 — ABNORMAL LOW
GFR CALC NON AF AMER: 20 — AB
GLUCOSE: 82 mg/dL (ref 65–99)
Osmolality: 288 (ref 275–301)
POTASSIUM: 4.2 mmol/L (ref 3.5–5.1)
SGOT(AST): 19 U/L (ref 15–37)
Sodium: 140 mmol/L (ref 136–145)
Total Protein: 6.8 g/dL (ref 6.4–8.2)

## 2013-11-27 LAB — URINALYSIS, COMPLETE
Bacteria: NONE SEEN
Bilirubin,UR: NEGATIVE
GLUCOSE, UR: NEGATIVE mg/dL (ref 0–75)
Ketone: NEGATIVE
Nitrite: NEGATIVE
Ph: 5 (ref 4.5–8.0)
Protein: NEGATIVE
RBC,UR: 13 /HPF (ref 0–5)
SQUAMOUS EPITHELIAL: NONE SEEN
Specific Gravity: 1.006 (ref 1.003–1.030)
WBC UR: 4 /HPF (ref 0–5)

## 2013-11-27 LAB — CBC
HCT: 28 % — ABNORMAL LOW (ref 35.0–47.0)
HGB: 8.8 g/dL — ABNORMAL LOW (ref 12.0–16.0)
MCH: 27.1 pg (ref 26.0–34.0)
MCHC: 31.6 g/dL — AB (ref 32.0–36.0)
MCV: 86 fL (ref 80–100)
Platelet: 287 10*3/uL (ref 150–440)
RBC: 3.26 10*6/uL — ABNORMAL LOW (ref 3.80–5.20)
RDW: 17.3 % — AB (ref 11.5–14.5)
WBC: 4.1 10*3/uL (ref 3.6–11.0)

## 2014-01-26 DIAGNOSIS — E559 Vitamin D deficiency, unspecified: Secondary | ICD-10-CM | POA: Insufficient documentation

## 2014-01-26 DIAGNOSIS — E7800 Pure hypercholesterolemia, unspecified: Secondary | ICD-10-CM | POA: Insufficient documentation

## 2014-01-26 DIAGNOSIS — I509 Heart failure, unspecified: Secondary | ICD-10-CM | POA: Insufficient documentation

## 2014-01-26 DIAGNOSIS — D649 Anemia, unspecified: Secondary | ICD-10-CM | POA: Insufficient documentation

## 2014-01-26 DIAGNOSIS — E785 Hyperlipidemia, unspecified: Secondary | ICD-10-CM | POA: Insufficient documentation

## 2014-01-26 DIAGNOSIS — I359 Nonrheumatic aortic valve disorder, unspecified: Secondary | ICD-10-CM | POA: Insufficient documentation

## 2014-02-16 ENCOUNTER — Other Ambulatory Visit: Payer: Self-pay | Admitting: Neurology

## 2014-02-19 ENCOUNTER — Other Ambulatory Visit: Payer: Self-pay | Admitting: Neurology

## 2014-02-19 MED ORDER — HYDROCODONE-ACETAMINOPHEN 5-325 MG PO TABS: 1.0000 | ORAL_TABLET | Freq: Four times a day (QID) | ORAL | Status: DC | PRN

## 2014-02-19 NOTE — Telephone Encounter (Signed)
Patient's power of attorney [name] calling to request hydrocodone script refill, states they want it before they leave for the beach. Please return call and advise.

## 2014-02-19 NOTE — Telephone Encounter (Signed)
Needs RX for HYDROcodone-acetaminophen (NORCO/VICODIN) 5-325 MG per tablet please call when ready

## 2014-02-19 NOTE — Telephone Encounter (Signed)
Rx was prescribed at [facility] in Feb.  Request sent to provider for approval

## 2014-02-20 NOTE — Telephone Encounter (Signed)
Called pt and left message informing her that her Rx was ready to be picked up at the front desk and if she has any other problems, questions or concerns to call the office.  °

## 2014-03-05 ENCOUNTER — Ambulatory Visit (INDEPENDENT_AMBULATORY_CARE_PROVIDER_SITE_OTHER): Payer: Medicare Other | Admitting: Neurology

## 2014-03-05 ENCOUNTER — Encounter: Payer: Self-pay | Admitting: Neurology

## 2014-03-05 VITALS — BP 138/82 | HR 64 | Wt 120.0 lb

## 2014-03-05 DIAGNOSIS — R269 Unspecified abnormalities of gait and mobility: Secondary | ICD-10-CM

## 2014-03-05 DIAGNOSIS — G4752 REM sleep behavior disorder: Secondary | ICD-10-CM

## 2014-03-05 DIAGNOSIS — G20 Parkinson's disease: Secondary | ICD-10-CM

## 2014-03-05 HISTORY — DX: REM sleep behavior disorder: G47.52

## 2014-03-05 NOTE — Patient Instructions (Signed)

## 2014-03-05 NOTE — Progress Notes (Signed)
Reason for visit: Parkinson's disease  Brianna Coleman is an 73 y.o. female  History of present illness:  Brianna Coleman is a 73 year old right-handed white female with a history of Parkinson's disease. The patient has recently undergone a carotid endarterectomy. The patient has recovered well from this. She has had some alteration in her ability to ambulate, but she does not exercise on regular basis. It appears that she is having difficulty taking her medications on time, and she will frequently forget to take her medications. The patient has an alarm on her cell phone, but she often ignores the alarm, and does not take the medication. She does not exercise on a regular basis. The patient denies any falls, she uses a cane for ambulation. The patient has some problems with yelling out and acting out her dreams at night. She has been noted to have a whispery voice at times, and she walks with a dystonic posture involving her left arm. The patient has not had any problems with dyskinesias. She is having some dysphagia, she reports that this occurs with solids, not on a daily basis. She returns for an evaluation.  Past Medical History  Diagnosis Date  . Parkinson disease   . Dyslipidemia   . Hypertension   . Arthritis, degenerative   . Gastroesophageal reflux disease   . RLS (restless legs syndrome)   . Neurogenic bladder   . Dysphagia   . Anemia   . Memory disturbance   . Episode of syncope     Near-syncope  . Gait disorder   . Heart murmur   . Stroke     no residual  . Shortness of breath     with exertion  . Blood in stool   . History of blood transfusion   . Aortic stenosis     mild AS by 03/20/13 echo [facility])  . Chronic renal insufficiency     CKD stage IV, sees Dr Holley Raring in [place]    Past Surgical History  Procedure Laterality Date  . Cataract extraction, bilateral    . Vaginal cyst removal  2009  . Colonoscopy    . Upper gi endoscopy    . Eye  surgery Bilateral     cataract  . Endarterectomy Right 09/19/2013    Procedure: ENDARTERECTOMY CAROTID WITH PATCH ANGIOPLASTY;  Surgeon: Elam Dutch, MD;  Location: [facility] OR;  Service: Vascular;  Laterality: Right;  . Carotid endarterectomy      Family History  Problem Relation Age of Onset  . Cancer Mother   . Hypertension Mother   . Heart disease Mother   . Hyperlipidemia Mother   . Heart attack Father   . Hypertension Father   . Diabetes Brother   . Diabetes Brother   . Heart disease Brother   . Hyperlipidemia Brother   . Hypertension Brother   . Varicose Veins Brother   . Hypertension Sister     Social history:  reports that she has never smoked. She has never used smokeless tobacco. She reports that she does not drink alcohol or use illicit drugs.   No Known Allergies  Medications:  Current Outpatient Prescriptions on File Prior to Visit  Medication Sig Dispense Refill  . aspirin 81 MG tablet Take 81 mg by mouth daily.      . carbidopa-levodopa (SINEMET IR) 25-250 MG per tablet TAKE 1/2 A TABLET BY MOUTH 4 TIMES A DAY (MORNING,NOON,4 PM, AND NIGHT)  180 tablet  1  . HYDROcodone-acetaminophen (  NORCO/VICODIN) 5-325 MG per tablet Take 1 tablet by mouth every 6 (six) hours as needed.  90 tablet  0  . pramipexole (MIRAPEX) 1 MG tablet TAKE 1 TABLET BY MOUTH 3 TIMES A DAY  270 tablet  1   No current facility-administered medications on file prior to visit.    ROS:  Out of a complete 14 system review of symptoms, the patient complains only of the following symptoms, and all other reviewed systems are negative.  Runny nose, difficulty swallowing Wheezing, shortness of breath Leg swelling, heart murmur Cold intolerance Snoring, sleep talking Incontinence of bladder, urinary urgency Back pain, walking difficulty, neck stiffness Itching Bruising easily, anemia Memory loss, weakness, tremors  Blood pressure 138/82, pulse 64, weight 120 lb (54.432 kg).  Physical  Exam  General: The patient is alert and cooperative at the time of the examination. Masking of the face is seen.  Skin: 2+ edema at the ankles is noted bilaterally.   Neurologic Exam  Mental status: The Mini-Mental status examination done today shows a total score of 28/30.  Cranial nerves: Facial symmetry is present. Speech is normal, no aphasia or dysarthria is noted. Extraocular movements are full, with exception that there is some restriction of superior gaze. Visual fields are full.  Motor: The patient has good strength in all 4 extremities.  Sensory examination: Soft touch sensation is symmetric on the face, arms, and legs.  Coordination: The patient has good finger-nose-finger and heel-to-shin bilaterally.  Gait and station: The patient is able to arise from a seated position with arms crossed. Once up, she is able to ambulate independently, dystonic posturing of the left arm is noted, with the arm slightly behind her while walking. No tremor is seen. Tandem gait was not attempted. Romberg is negative, no drift is seen. The patient walks with a cane usually.  Reflexes: Deep tendon reflexes are symmetric.   Assessment/Plan:  1. Parkson's disease  2. Gait disorder  3. REM sleep behavior disorder  The patient is not taking her medications properly. The patient is to try to take the medications on time, and not miss any doses. She is to increase her activity level, and she is to walk on a regular basis. The patient may require an increase in her Sinemet dosing in the near future. The patient will followup in about 4 months.   Jill Alexanders MD 03/05/2014 7:57 PM  [facility] [address] [place], [place] [address]  Phone [phone] Fax [phone]

## 2014-04-12 ENCOUNTER — Ambulatory Visit: Payer: Medicare Other | Admitting: Vascular Surgery

## 2014-04-12 ENCOUNTER — Other Ambulatory Visit (HOSPITAL_COMMUNITY): Payer: Medicare Other

## 2014-05-09 ENCOUNTER — Encounter: Payer: Self-pay | Admitting: Neurology

## 2014-05-15 ENCOUNTER — Encounter: Payer: Self-pay | Admitting: Neurology

## 2014-07-05 ENCOUNTER — Telehealth: Payer: Self-pay | Admitting: Neurology

## 2014-07-05 ENCOUNTER — Ambulatory Visit
Admission: RE | Admit: 2014-07-05 | Discharge: 2014-07-05 | Disposition: A | Discharge status: Home / Self Care | Payer: Medicare Other | Source: Ambulatory Visit | Attending: Neurology | Admitting: Neurology

## 2014-07-05 ENCOUNTER — Ambulatory Visit (INDEPENDENT_AMBULATORY_CARE_PROVIDER_SITE_OTHER): Payer: Medicare Other | Admitting: Neurology

## 2014-07-05 ENCOUNTER — Encounter: Payer: Self-pay | Admitting: Neurology

## 2014-07-05 VITALS — BP 130/69 | HR 84 | Ht 60.0 in | Wt 129.4 lb

## 2014-07-05 DIAGNOSIS — R413 Other amnesia: Secondary | ICD-10-CM

## 2014-07-05 DIAGNOSIS — G20 Parkinson's disease: Secondary | ICD-10-CM

## 2014-07-05 DIAGNOSIS — R269 Unspecified abnormalities of gait and mobility: Secondary | ICD-10-CM

## 2014-07-05 MED ORDER — HYDROCODONE-ACETAMINOPHEN 5-325 MG PO TABS: 1.0000 | ORAL_TABLET | Freq: Four times a day (QID) | ORAL | Status: DC | PRN

## 2014-07-05 MED ORDER — PRAMIPEXOLE DIHYDROCHLORIDE 1 MG PO TABS: 1.0000 mg | ORAL_TABLET | Freq: Three times a day (TID) | ORAL | Status: DC

## 2014-07-05 MED ORDER — CARBIDOPA-LEVODOPA 25-250 MG PO TABS: ORAL_TABLET | ORAL | Status: DC

## 2014-07-05 NOTE — Progress Notes (Signed)
Reason for visit: Parkinson's disease  Brianna Coleman is an 74 y.o. female  History of present illness:  Brianna Coleman is a 74 year old right-handed white female with a history of Parkinson's disease. The patient has developed a memory disorder and a problem with her gait stability. She has fallen on 2 occasions recently. She fell backwards within the last several weeks, and she has some pain at the base of the spine. The patient indicates that she has a cane for ambulation, but she does not always use the cane. She is having ongoing difficulty remembering to take her medications. The family has been trying to monitor this, but it is difficult to get the patient to consistently take her medications. The patient continues to have some issues with memory, and this has gradually worsened over time. She is sleeping well at night, she denies any hallucinations. She denies any other significant medical issues that have come up since last seen. She comes to this office for further evaluation. She remains on Mirapex and Sinemet.  Past Medical History  Diagnosis Date  . Parkinson disease   . Dyslipidemia   . Hypertension   . Arthritis, degenerative   . Gastroesophageal reflux disease   . RLS (restless legs syndrome)   . Neurogenic bladder   . Dysphagia   . Anemia   . Memory disturbance   . Episode of syncope     Near-syncope  . Gait disorder   . Heart murmur   . Stroke     no residual  . Shortness of breath     with exertion  . Blood in stool   . History of blood transfusion   . Aortic stenosis     mild AS by 03/20/13 echo [facility])  . Chronic renal insufficiency     CKD stage IV, sees Dr Holley Raring in [place]  . 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 Cataract extraction, bilateral    . Vaginal cyst removal  2009  . Colonoscopy    . Upper gi endoscopy    . Eye surgery Bilateral     cataract  . Endarterectomy  Right 09/19/2013    Procedure: ENDARTERECTOMY CAROTID WITH PATCH ANGIOPLASTY;  Surgeon: Elam Dutch, MD;  Location: [facility] OR;  Service: Vascular;  Laterality: Right;  . Carotid endarterectomy      Family History  Problem Relation Age of Onset  . Cancer Mother   . Hypertension Mother   . Heart disease Mother   . Hyperlipidemia Mother   . Heart attack Father   . Hypertension Father   . Diabetes Brother   . Diabetes Brother   . Heart disease Brother   . Hyperlipidemia Brother   . Hypertension Brother   . Varicose Veins Brother   . Hypertension Sister     Social history:  reports that she has never smoked. She has never used smokeless tobacco. She reports that she does not drink alcohol or use illicit drugs.   No Known Allergies  Medications:  Current Outpatient Prescriptions on File Prior to Visit  Medication Sig Dispense Refill  . aspirin 81 MG tablet Take 81 mg by mouth daily.    Marland Kitchen conjugated estrogens (PREMARIN) vaginal cream Place 1 Applicatorful vaginally at bedtime.    . ferrous sulfate 325 (65 FE) MG tablet Take 325 mg by mouth daily with breakfast.     No current facility-administered medications on file prior to visit.  ROS:  Out of a complete 14 system review of symptoms, the patient complains only of the following symptoms, and all other reviewed systems are negative.  Difficulty swallowing Wheezing, shortness of breath Leg swelling, heart murmur Cold intolerance Snoring, sleep talking, acting out dreams Incontinence of the bladder Joint pain, back pain, walking difficulty, neck stiffness Itching Memory loss, weakness  Blood pressure 130/69, pulse 84, height 5' (1.524 m), weight 129 lb 6.4 oz (58.695 kg).  Physical Exam  General: The patient is alert and cooperative at the time of the examination.  Skin: 2+ edema below the knees is noted bilaterally.   Neurologic Exam  Mental status: The Mini-Mental Status Examination done today shows total  score 29/30.  Cranial nerves: Facial symmetry is present. Speech is normal, no aphasia or dysarthria is noted. Extraocular movements are full. Visual fields are full. Some masking of the face is seen.  Motor: The patient has good strength in all 4 extremities.  Sensory examination: Soft touch sensation on the face, arms, and legs was symmetric.  Coordination: The patient has good finger-nose-finger and heel-to-shin bilaterally. Mild apraxia with the use of the extremities is noted.  Gait and station: The patient walks with a cane, she has fairly good stability while walking. Turns are slow, deliberate. Tandem gait was not attempted. Romberg is negative.  Reflexes: Deep tendon reflexes are symmetric.   Assessment/Plan:  1. Parkinson's disease  2. Gait disorder  3. Memory disorder  The patient needs to take her medications properly on a daily basis. We will go up some on the Sinemet taking one full tablet in the morning and at 4 PM, one half tablet at noon and at bedtime. The patient will be set up for physical therapy for gait training. She has developed some back pain following the fall, we will get an x-ray of the lumbar spine today. The patient was given refills on her Mirapex and Sinemet, and a prescription was given for hydrocodone for pain. The patient will follow-up in about 4 months.  Jill Alexanders MD 07/05/2014 8:56 PM  [facility] [address] [place], [place] [address]  Phone [phone] Fax [phone]

## 2014-07-05 NOTE — Telephone Encounter (Signed)
I called concerning the x-ray of the low back. There is osteoporosis, no definite new fracture is seen. The patient does have chronic L2 compression fracture, age indeterminate L4 compression fracture.   Lumbar x ray 07/05/14:  IMPRESSION: 1. No definite sacral fracture although evaluation is limited both by underlying osteopenia and artifact from overlying gas and stool within the bowel. If clinical concern persists for sacral fracture, recommend further evaluation with either nuclear medicine bone scan or pelvic MRI. Both the studies would be more sensitive than CT scan for the detection of nondisplaced fracture. 2. Stable chronic L2 compression fracture dating back to at least March of 2015. 3. Age indeterminate L4 compression fracture. A chronic process is favored. 4. Multilevel degenerative disc disease and advanced lower lumbar facet arthropathy. 5. Grade 1 anterolisthesis of L5 on S1 is favored to be degenerative in etiology. 6. Aortic atherosclerosis.

## 2014-07-05 NOTE — Patient Instructions (Signed)
Parkinson Disease Parkinson disease is a disorder of the central nervous system, which includes the brain and spinal cord. A person with this disease slowly loses the ability to completely control body movements. Within the brain, there is a group of nerve cells (basal ganglia) that help control movement. The basal ganglia are damaged and do not work properly in a person with Parkinson disease. In addition, the basal ganglia produce and use a brain chemical called dopamine. The dopamine chemical sends messages to other parts of the body to control and coordinate body movements. Dopamine levels are low in a person with Parkinson disease. If the dopamine levels are low, then the body does not receive the correct messages it needs to move normally.  CAUSES  The exact reason why the basal ganglia get damaged is not known. Some medical researchers have thought that infection, genes, environment, and certain medicines may contribute to the cause.  SYMPTOMS   An early symptom of Parkinson disease is often an uncontrolled shaking (tremor) of the hands. The tremor will often disappear when the affected hand is consciously used.  As the disease progresses, walking, talking, getting out of a chair, and new movements become more difficult.  Muscles get stiff and movements become slower.  Balance and coordination become harder.  Depression, trouble swallowing, urinary problems, constipation, and sleep problems can occur.  Later in the disease, memory and thought processes may deteriorate. DIAGNOSIS  There are no specific tests to diagnose Parkinson disease. You may be referred to a neurologist for evaluation. Your caregiver will ask about your medical history, symptoms, and perform a physical exam. Blood tests and imaging tests of your brain may be performed to rule out other diseases. The imaging tests may include an MRI or a CT scan. TREATMENT  The goal of treatment is to relieve symptoms. Medicines may be  prescribed once the symptoms become troublesome. Medicine will not stop the progression of the disease, but medicine can make movement and balance better and help control tremors. Speech and occupational therapy may also be prescribed. Sometimes, surgical treatment of the brain can be done in young people. HOME CARE INSTRUCTIONS  Get regular exercise and rest periods during the day to help prevent exhaustion and depression.  If getting dressed becomes difficult, replace buttons and zippers with Velcro and elastic on your clothing.  Take all medicine as directed by your caregiver.  Install grab bars or railings in your home to prevent falls.  Go to speech or occupational therapy as directed.  Keep all follow-up visits as directed by your caregiver. SEEK MEDICAL CARE IF:  Your symptoms are not controlled with your medicine.  You fall.  You have trouble swallowing or choke on your food. MAKE SURE YOU:  Understand these instructions.  Will watch your condition.  Will get help right away if you are not doing well or get worse. Document Released: 06/05/2000 Document Revised: 10/03/2012 Document Reviewed: 07/08/2011 [employer] Patient Information 2015 [employer]. This information is not intended to replace advice given to you by your health care provider. Make sure you discuss any questions you have with your health care provider.  

## 2014-07-16 ENCOUNTER — Other Ambulatory Visit: Payer: Self-pay | Admitting: Neurology

## 2014-10-13 NOTE — H&P (Signed)
PATIENT NAME:  Brianna Coleman, Brianna Coleman MR#:  [id number] DATE OF BIRTH:  1940-06-26  DATE OF ADMISSION:  08/03/2013  PRIMARY CARE PHYSICIAN: Dr. Randel Books  NEPHROLOGIST: Dr. Holley Raring   HEMATOLOGIST: Dr. Nyra Market in for direct admission for Dr. Elwyn Lade office for hemoglobin of 6.1.   CHIEF COMPLAINT: Shortness of breath.   HISTORY OF PRESENT ILLNESS: This is a 74 year old female with chronic anemia, kidney failure. She does receive Aranesp injections as outpatient to try to keep up with her blood counts. Dr. Holley Raring did a blood count. It was 6.1, sent in for a direct admission for blood transfusion and iron deficiency anemia. The patient is having some shortness of breath and did have an episode over the weekend where she had some freezing up and confusion and mouth drooping.   PAST MEDICAL HISTORY: Chronic kidney disease, anemia, Parkinson's, hypertension and TIA.   PAST SURGICAL HISTORY: Vaginal tumor in the past.   ALLERGIES: No known drug allergies   MEDICATIONS: Include hydrocodone acetaminophen 5/500 as needed for pain, Aranesp injection every two weeks, aspirin 81 mg daily, carbidopa levodopa 25/250 1/2 tablets 4 times a day, Centrum with multivitamin and minerals 1 tablet daily, enalapril 5 mg 1/2 tablet daily, metoprolol 100 mg at bedtime, nifedipine ER extended-release during the day. pramipexole 1 mg 3 times a day.   SOCIAL HISTORY: No smoking. No alcohol. No drug use. Lives with her daughter.   FAMILY HISTORY: Father died in late 17s of lung cancer. Mother died in late 68s of a MI.  REVIEW OF SYSTEMS:  CONSTITUTIONAL: Positive for fever. Positive for chills. Positive for weight loss in the past but has been stable for a while. Positive for fatigue.  EARS, NOSE, MOUTH AND THROAT: Positive for postnasal drip. Positive for dysphagia with Parkinson's.  CARDIOVASCULAR: No chest pain. No palpitations.  RESPIRATORY: Positive for shortness of breath. No cough. No sputum. No hemoptysis.   GASTROINTESTINAL: Positive for constipation secondary to not drinking water. No abdominal pain. No nausea or vomiting. Trace blood in the bowel movements previous testing.  GENITOURINARY: No burning on urination, blood in the urine previously.  NEUROLOGIC: No fainting or blackouts, but had an episode of freezing and confusion over the weekend.  INTEGUMENTARY: Positive for dry skin.  PSYCHIATRIC: No anxiety or depression.  ENDOCRINE: No thyroid problems.  HEMATOLOGIC AND LYMPHATIC: Positive for anemia.   PHYSICAL EXAMINATION: VITAL SIGNS: Temperature 97.5, pulse 68, blood pressure 124/62, pulse oximetry 99% on room air.  IN GENERAL: No respiratory distress.  HEENT: Eyes: Conjunctivae pale. Lids normal. Pupils equal, round, and reactive to light. Extraocular muscles intact. No nystagmus. Ears, nose, mouth and throat: Nasal mucosa: No erythema.  THROAT: No erythema. No exudate seen. Lips and gums: No lesions.  NECK: No JVD. No bruits. No lymphadenopathy. No thyromegaly. No thyroid nodules palpated.  RESPIRATORY: Lungs clear to auscultation. No use of accessory muscles to breathe. No rhonchi, rales, or wheeze heard.  CARDIOVASCULAR: S1, S2 normal. Positive  4/6 systolic ejection murmur. Carotid upstroke 2+ bilaterally. No bruits.  EXTREMITIES: Dorsalis pedis pulses 2+ bilaterally. No edema of the lower extremity.  ABDOMEN: Soft, nontender. No organomegaly/splenomegaly. Normoactive bowel sounds. No masses felt.  LYMPHATIC: No lymph nodes in the neck.  MUSCULOSKELETAL: No clubbing, 2+ edema. No cyanosis.  SKIN: No ulcers or lesions seen. Cranial nerves II through XII grossly intact. Deep tendon reflexes 2+ bilateral lower extremities.   PSYCHIATRIC: The patient is oriented to person, place, and time.   LABORATORY AND RADIOLOGICAL DATA:  PT 13.8. INR 1.1, PTT 29.6. Glucose 84, BUN 58, creatinine 2.47, sodium 137, potassium 4.8, chloride 112, CO2 of 22, calcium 8.5. Liver function tests normal  range. White blood cell count 2.9, H and H 5.9 and 18.6, platelet count 144, retic count 1.17, ferritin 5, TIBC 373, iron serum 18.   ASSESSMENT AND PLAN: 1. Severe iron deficiency anemia with shortness of breath. We will transfuse 2 units of packed red blood cells over four hours each. We will give protoniix injection. Gastroenterology consult with Dr. Allen Norris can consider a repeat endoscopy and/or capsule endoscopy. Looking back at old records, the patient did have a colonoscopy back in 2012 that showed a 5 mm polyp and diverticulosis. No active bleeding. 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  2. Chronic kidney disease stage IV. Follow-up as outpatient with Dr. Holley Raring.  3. Parkinson. Continue Sinemet.  4. Hypertension. Continue usual blood pressure medications.  5. History of transient ischemic attack. We will hold aspirin at this point with severe anemia.  TIME SPENT ON ADMISSION: 55 minutes.   CODE STATUS: The patient is a DO NOT RESUSCITATE.    ____________________________ Tana Conch. Leslye Peer, MD rjw:sg D: 08/03/2013 13:40:00 ET T: 08/03/2013 14:04:37 ET JOB#: [id number]  cc: Tana Conch. Leslye Peer, MD, <Dictator> Dr. Alen Bleacher, MD Rae Halsted. Kallie Edward, MD  Marisue Brooklyn MD ELECTRONICALLY SIGNED 08/12/2013 12:22

## 2014-10-13 NOTE — Discharge Summary (Signed)
PATIENT NAME:  Brianna Coleman, CORNMAN MR#:  [id number] DATE OF BIRTH:  06-03-1941  DATE OF ADMISSION:  08/03/2013  DATE OF DISCHARGE:  08/04/2013  ADMITTING DIAGNOSIS: Iron deficiency anemia with shortness of breath.   DISCHARGE DIAGNOSES: 1.  Severe iron deficiency anemia, status post 2 units of packed red blood cell transfusion.  2. Guaiac positive stools, status post EGD on 08/04/2013 by Dr. Allen Norris, which was unremarkable.  3.  History of chronic kidney disease stage IV.  4.  Parkinson's disease.  5.  Hypertension.  6.  Transient ischemic attack.   DISCHARGE CONDITION: Stable.   DISCHARGE MEDICATIONS:  1.  The patient is to resume carbidopa/levodopa 25/200, 1/2 tablet 4 times daily. 2.  Nifedipine extended release 30 mg p.o. daily. 3.  Centrum Cardiac therapeutic multivitamins with minerals 1 tablet once daily. 4.  Aranesp 300 mcg in 0.6 mL injectable solution 1 mcg injected every 2 weeks.  5.  Enalapril 5 mg p.o. 1/2 tablet once daily.  6.  Metoprolol succinate 100 mg p.o. at bedtime.  7.  Pramipexole 1 mg p.o. 3 times daily.  8.  Acetaminophen hydrocodone 2 tablets as needed for pain.  The patient was advised to stop aspirin.   INSTRUCTIONS: Home oxygen: None. Diet: 2 grams salt, regular consistency. Activity limitations as tolerated. Follow up appointment with Dr. Holley Raring 2 days after discharge and with Dr. Allen Norris 2 days after discharge.    CONSULTANTS: Care Management, social worker, Dr. Allen Norris.   PROCEDURE: Upper GI endoscopy 08/04/2013 revealing LA grade A reflux esophagitis, hiatal hernia, stomach was J-shaped, normal exam of duodenum.  RADIOLOGIC STUDIES: None.   REASON FOR ADMISSION:  The patient is a 74 year old Caucasian female with past medical history significant for history of iron deficiency anemia, who is Aranesp dependent, presents to the hospital with shortness of breath. Please refer to Dr. Marshia Ly admission note on 08/03/2013. On arrival to the Emergency Room, the  patient's temperature was 97.5, pulse was 68, respiration rate was not checked, blood pressure 124/62, saturation was 99% on room air. Physical examination was unremarkable. The patient was Guaiac positive.   The patient's lab data done on arrival to the Emergency Room showed iron level low at 18, BUN and creatinine were 58 and 2.47, iron saturation was only 5, and ferritin level was also only 5. The patient's liver enzymes revealed an albumin level of 3.1, otherwise unremarkable. Troponins x 2 were within normal limits. White blood cell count was low at 2.9, hemoglobin was only 5.9, and platelet count was 144. The patient was transfused 1 unit on 08/03/2013, after which the patient's hemoglobin level was rechecked and was found to be 7.2. Level was again rechecked in the morning on 08/04/2013. It was 6.9, and was transfused 1 more unit, after which the patient's hemoglobin level increased to 9.4. It was felt that the patient's hemoglobin level is adequate now for discharge. The patient was advised to continue follow up with her usual physicians who take care of her iron deficiency, including Dr. Holley Raring and Dr. Kallie Edward.   The patient's family, daughter was evaluated and consulted by patient's relations due to their concerns about transfusions. The patient underwent EGD on 08/04/2013, which was remarkable essentially, with mild esophagitis. The patient will undergo further evaluation, including possibly capsule endoscopy as outpatient. In regards to her chronic medical problems, including chronic kidney disease, Parkinson's disease, hypertension, she is to continue her medications for transient ischemic attack. She was advised to stop her aspirin until source of  bleeding is known.   On day of discharge, 08/04/2013, her vital signs were temperature 98.5, pulse was 69, respiratory rate was 18, blood pressure 148/70, saturation was 97% on room air at rest.   Time spent:  40  minutes.  ____________________________ Theodoro Grist, MD rv:mr D: 08/06/2013 18:03:00 ET T: 08/06/2013 19:40:09 ET JOB#: [id number]  cc: Tama High, MD Lucilla Lame, MD Theodoro Grist, MD, <Dictator>     [place] MD ELECTRONICALLY SIGNED 08/22/2013 21:44

## 2014-10-13 NOTE — Consult Note (Signed)
PATIENT NAME:  Brianna Coleman, Brianna Coleman MR#:  [id number] DATE OF BIRTH:  [birth year]  DATE OF CONSULTATION:  08/03/2013  REFERRING PHYSICIAN:  Tana Conch. Leslye Peer, MD CONSULTING PHYSICIAN:  Andria Meuse, NP  PRIMARY CARE PHYSICIAN: Milinda Pointer. Jacqualine Code, MD  HEMATOLOGIST: Kennieth Francois, MD  NEPHROLOGIST: Tama High, MD  REASON FOR CONSULTATION: Iron deficiency anemia.   HISTORY OF PRESENT ILLNESS: [name] is a 74 year old Caucasian female. She has history of chronic kidney disease and chronic iron deficiency anemia. She was previously followed by Dr. Murvin Natal, hematologist, and had a complete anemia workup, including bone marrow biopsy. Her power of attorney is her daughter, and daughter's partner, who provides a significant amount of the history as she has kept up very well with [name]'s care. She was admitted to the hospital with a hemoglobin of 5.9 and a ferritin of 5. She does have pancytopenia, and her white blood cell count is 2.9 and her platelet count is 144. She has a creatinine of 2.47. She denies any recent melena or rectal bleeding. She did test Hemoccult positive about 3 months ago at Dr. Rachell Cipro office. She is followed by Dr. Kallie Edward currently and has been receiving Aranesp injections, her last injection was January 19, and she was scheduled to see her next week. Prior to this, she was receiving q.2 week injections under the direction of Dr. Murvin Natal. Her daughter's significant other states that Procrit did not seem to work previously. She has a normal INR. She is not on any blood thinners or NSAIDs except for a baby aspirin daily with a history of TIAs. There is no known history of cancer. Her brother also had chronic kidney disease and anemia. She has only required a couple transfusions in the last several years. She has been off PPI for quite some time. Her last EGD showed erosive gastritis in April 2012. She has a history of taking Aleve for joint pain and was taking medication at  that time, but no active bleeding was noted. Dr. Dionne Milo did her last colonoscopy in August 2012, where he found sigmoid diverticulosis, and a 5 mm tubular adenoma was removed from her sigmoid colon. She denies any esophageal dysphagia or odynophagia. Denies any heartburn or indigestion. Denies any nausea or vomiting.    Previous GI work-up includes a May 2009 colonoscopy for Hemoccult-positive stool where she was found to have sigmoid diverticulosis, and an EGD in March 2009 which showed a hiatal hernia and nonbleeding erosive gastropathy with a negative H. pylori.   PAST MEDICAL AND SURGICAL HISTORY: Hypertension, osteoporosis, chronic anemia, Parkinson's, TIA. She has a history of erosive gastritis/gastropathy on EGD that was described above in 2012 and 2009. 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 Chronic kidney disease, tonsillectomy and benign vaginal cyst.   MEDICATIONS PRIOR TO ADMISSION:  1. Aspirin 81 mg daily.  2. Acetaminophen/hydrocodone 500/5 mg p.r.n. pain.  3. Aranesp 300 mcg/0.6 mL every 2 weeks.  4. Carbidopa/levodopa 25/250 mg 1/2 tablet 4 times daily for Parkinson's.  5. Centrum multivitamins daily.  6. Enalapril 5 mg 1/2 tablet daily.  7. Metoprolol 100 mg at bedtime.  8. Nifedipine 30 mg daily.  9. Pramipexole 1 mg t.i.d.   ALLERGIES: No known drug allergies.   FAMILY HISTORY: Positive  for a brother with anemia, diabetes mellitus and chronic kidney disease. Her father had lung cancer.   SOCIAL HISTORY: She lives with her daughter and her daughter's significant other, who report they have co-power of attorney. She has 2 healthy children. She is a retired [employer] forces logistics/support/administrative officer. She denies any tobacco, alcohol or illicit drug use.   REVIEW OF SYSTEMS:   MUSCULOSKELETAL: She has chronic mid to low back pain and chronic joint pain. NEUROLOGIC: She has confusion at times.  Otherwise, negative 12-point review of systems.  PHYSICAL EXAMINATION:  VITAL SIGNS: Temperature 97.5, pulse 68, respirations 20, blood pressure is 124/62, oxygen saturation 99% on room air.  GENERAL: She is an elderly Caucasian female who is alert, oriented, pleasant and cooperative, in no acute distress. She is accompanied by her daughter's significant other.   SKIN: Pale, warm and dry.  HEENT: Sclerae clear, nonicteric. Conjunctivae pale. Oropharynx pink and moist without any lesions.  NECK: Supple without any mass or thyromegaly.  CHEST: Heart regular rate and rhythm. She has a 3/6 murmur noted.  LUNGS: Clear to auscultation bilaterally.  ABDOMEN: Positive bowel sounds x4. No bruits auscultated. Abdomen is soft, nontender, nondistended, without palpable mass or hepatosplenomegaly. No rebound tenderness or guarding.  EXTREMITIES: She has 1+ bilateral pretibial and pedal edema.  RECTAL: No external lesions visualized. Good sphincter tone. No internal masses palpated. Small amount of dark brown stool was obtained from the vault which is Hemoccult positive.  MUSCULOSKELETAL: Good equal movement and strength bilaterally.  PSYCHIATRIC: Alert, cooperative. Normal mood and affect.   LABORATORY STUDIES: Iron 18, BUN 58, creatinine 2.47, chloride 112, otherwise normal basic metabolic panel. LFTs show an albumin of 3.1 and an ALT of 7, otherwise normal. Hemoglobin 5.9, hematocrit 18.6, platelets 144, white blood cell count 2.9.   IMPRESSION: [name] is a very pleasant 74 year old Caucasian female with history of chronic kidney disease, chronic iron deficiency anemia and erosive gastritis/gastropathy, who presents with acute on chronic transfusion-dependent anemia. She is strongly Hemoccult positive. Her anemia is likely multifactorial due to anemia of chronic disease as well as  chronic gastrointestinal blood loss. She is on aspirin 81 mg daily for history of transient ischemic attacks. Her last colonoscopy was in August 2012 by Dr. Dionne Milo with tubular adenoma removed and sigmoid diverticulosis. Her last esophagogastroduodenoscopy was in April 2012 which showed erosive gastritis. There is no history of H. pylori. Bleeding at this time could be due to recurrent erosive gastropathy, peptic ulcer disease, arteriovenous malformations or small bowel source, Dieulafoy.    PLAN:  1. EGD tomorrow with Dr. Allen Norris. I discussed the risks and benefits to include, but not limited to bleeding, infection, perforation, drug reaction. She agrees with plan, and consent was obtained.  2. Protonix 40 mg IV b.i.d.  3. N.p.o. after midnight. Clear liquids now.  4. Continue Aranesp outpatient.  5. If nothing to explain GI blood loss and iron deficiency on EGD, would recommend small bowel capsule study to complete her GI workup.   We would like to thank you for allowing [place] to participate in the care of Ms. Runde.   ____________________________ Andria Meuse, NP klj:lb D: 08/03/2013 15:36:15 ET T: 08/03/2013 15:50:40 ET JOB#: [id number]  cc: Andria Meuse, NP, <Dictator> [place] Lilian Kapur, MD Rae Halsted Kallie Edward, [place] Jacqualine Code, [place] FNP ELECTRONICALLY SIGNED 08/17/2013 10:13

## 2014-10-13 NOTE — H&P (Signed)
PATIENT NAME:  Brianna Coleman, Brianna Coleman MR#:  [id number] DATE OF BIRTH:  12-31-1940  DATE OF ADMISSION:  11/22/2013  PRIMARY CARE PHYSICIAN:  Fara Olden B. Moffett, MD  CHIEF COMPLAINT:  Acute renal failure with high potassium.   HISTORY OF PRESENT ILLNESS: A 74 year old Caucasian female with a history of chronic kidney disease, anemia, Parkinson's disease, was sent to medical floor directly from home due to acute renal failure and high potassium. The patient is alert, awake, oriented, in no acute distress. According to the patient and the patient's daughter, the patient recently has been feeling weak and dizzy. The patient's blood pressure was lowat 80s  2 days ago. The patient was on 3 kinds of hypertension, but 2 kinds of hypertension medications was stopped due to hypotension. She only took enalapril. But today she got labs in Dr. Elwyn Lade office which showed increased creatinine at 3.0, BUN 49, potassium 5.8, so the enalapril was discontinued. The patient was sent to the hospital for direct admission. The patient denies any other symptoms.   PAST MEDICAL HISTORY: Chronic kidney disease, Parkinson disease, hypertension, transient ischemic attack,   PAST SURGICAL HISTORY: Carotid endarterectomy in March, vaginal tumor surgery in the past.   SOCIAL HISTORY: No smoking or drinking or illicit drugs.   FAMILY HISTORY: Father died in late 33s of lung cancer. Mother died in late 6s of myocardial infarction.   ALLERGIES: None.   HOME MEDICATIONS:  1.  Pramipexole 1 mg p.o. t.i.d. 2.  Iron 65 mg p.o. daily.  3.  Carbidopa/levodopa 25 mg/250 mg p.o. 1/2 tablet q.i.d. 4.  Aspirin 81 mg p.o. daily at 8:00 p.m.  5.  Aranesp 300 mcg/0.6 mL solution 1 mcg injection every 2 weeks.   REVIEW OF SYSTEMS:    CONSTITUTIONAL: The patient denies any fever or chills. No headache but has dizziness, weakness.  EYES: No double vision, no blurry vision.  ENT: No postnasal drip, slurred speech or dysphagia.  CARDIOVASCULAR:  No chest pain, palpitation, orthopnea, nocturnal dyspnea. No leg edema.  PULMONARY: No cough, sputum, shortness of breath or hemoptysis.  GASTROINTESTINAL: No abdominal pain, nausea, vomiting, diarrhea. No melena or bloody stool.  GENITOURINARY: No dysuria, hematuria or incontinence.  SKIN: No rash or jaundice.  NEUROLOGIC: No syncope, loss of consciousness or seizure.  ENDOCRINE: No polyuria or polydipsia, heat or cold intolerance.  HEMATOLOGIC: No easy bleeding or bleeding.   PHYSICAL EXAMINATION: VITAL SIGNS: Temperature 97.6, blood pressure 108/61, pulse 94, oxygen saturation 99% on room air.  GENERAL: The patient is alert, awake, oriented, in no acute distress.  HEENT: Pupils round, equal and reactive to light and accommodation.  NECK: Supple. No JVD or carotid bruits noted no lymphadenopathy. No thyromegaly.  CARDIOVASCULAR: S1, S2 regular rate and rhythm. Systolic murmur 4 out of 6.  No gallop. PULMONARY: Bilateral air entry. No wheezing or rales. No use of accessory muscle to breathe.  ABDOMEN: Soft. No distention. No tenderness. No organomegaly. Bowel sounds present.  EXTREMITIES: No edema, clubbing or cyanosis. No calf tenderness. Bilateral pedal pulses present.  SKIN: No rash or jaundice.  NEUROLOGICAL:  A and O x 3. No focal deficit. Power 5/5. Sensation intact.   LABORATORY, DIAGNOSTIC AND RADIOLOGICAL DATA:  Laboratory data is pending. According to the patient and her daughter, patient's potassium 5.8, BUN 49, creatinine 3.0 in Dr. Elwyn Lade office.  The patient's last creatinine was 2.28, BUN 52 in February. Hemoglobin 8.4.  EKG is pending.   IMPRESSIONS: 1.  Acute renal failure on chronic kidney  disease.  2.  Hyperkalemia.  3.  Anemia.  4.  Parkinson's disease.  5.  History of hypertension.   PLAN OF TREATMENT: 1.  The patient is being admitted to the medical floor. Will continue telemetry monitor, give Kayexalate p.o. stat and start normal saline IV. Follow up BMP.  2.   We will hold any hypertension medication including enalapril. Followup nephrology consult from Dr. Candiss Norse. We will also get a kidney ultrasound and a urinalysis.  3.  I discussed the patient's condition and plan of treatment with the patient and the patient's daughters. The patient wants DO NOT RESUSCITATE.   TIME SPENT: About 56 minutes.    ____________________________ Demetrios Loll, MD qc:cs D: 11/22/2013 14:54:00 ET T: 11/22/2013 15:11:14 ET JOB#: [id number]  cc: Demetrios Loll, MD, <Dictator> Demetrios Loll MD ELECTRONICALLY SIGNED 11/23/2013 10:26

## 2014-10-13 NOTE — Consult Note (Signed)
Brief Consult Note: Diagnosis: IDA/GI bleed.   Patient was seen by consultant.   Comments: Brianna Coleman is a very pleasant 74 y/o caucasian female with hx CKD, chronic IDA, & erosive gastritis/gastropathy who presents with acute on chronic transfusion dependent anemia.  She is strongly hemoccult positive.  Her anemia is likely multifactorial due to anemia of chronic kidney disease as well as chronic GI blood loss.  She is on ASA  81mg  daily for hx TIAs.  Last colonoscopy in 01/2011 by Dr Dionne Milo with tubular adenoma removed & sigmoid diverticulosis.  Last EGD 09/2010 erosive gastritis.  No hx of h pylori.  Differentials for chronic GI blood loss include oozing gastropathy, Dieulafoy, PUD or gastric or small bowel AVMS or malignancy.  Plan: 1) EGD tomorrow with Dr Allen Norris.  Discussed risks/benefits of procedure which include but are not limited to bleeding, infection, perforation & drug reaction.  Patient agrees with this plan & consent will be obtained. 2) Protonix 40mg  IV BID 3) NPO after MN, clear liquids now 4) Continue aranesp outpatient  5) If nothing to explain GI blood loss & IDA on EGD, would recommend small bowel capsule study to complete GI workup Thanks for allowing [place] to participate in the care of Brianna Coleman.  Please see full dictated note. #[id number].  Electronic Signatures: Andria Meuse (NP)  (Signed 12-Feb-15 15:37)  Authored: Brief Consult Note   Last Updated: 12-Feb-15 15:37 by Andria Meuse (NP)

## 2014-10-13 NOTE — Discharge Summary (Signed)
PATIENT NAME:  Brianna Coleman, Brianna Coleman MR#:  [id number] DATE OF BIRTH:  1941/04/01  DATE OF ADMISSION:  11/22/2013 DATE OF DISCHARGE:  11/24/2013  ADMITTING DIAGNOSES:  Acute renal failure, hyperkalemia.   DISCHARGE DIAGNOSES: 1.  Acute renal failure on chronic kidney disease, likely due to dehydration as a result of poor p.o. intake, now renal function improved.  2.  Hyperkalemia, related to acute renal failure, now resolved.  3.  Urinary tract infection present on admission.  4.  Anemia, likely anemia of chronic disease.  5.  History of hypertension, not on any antihypertensives.  6.  Parkinson disease.   PERTINENT LABS AND EVALUATIONS: Admitting glucose 100, BUN 57, creatinine 3.13, sodium 135, potassium 5.0, chloride 106. CO2 was 23. Calcium was 8.9. WBC count was 6.0, hemoglobin 8.8, platelet count 287. Urinalysis showed greater than 100,000 E. coli pan sensitive, 3+ leukocytes. WBCs were 16,400, 3+ bacteria. Echocardiogram showed normal systolic function, mildly dilated left atrium, mild to moderate mitral regurg, mild to moderate aortic valve sclerosis, severely increased left ventricular posterior wall. Kidney ultrasound showed a moderate right and mild left pelviectasis without overt hydronephrosis, nonspecific 3.7  right hepatic mass, moderate postvoid residual in the urinary bladder.   HOSPITAL COURSE:  Please refer to H and P done by the admitting physician. The patient is a 74 year old white female who was referred from nephrology office for acute renal failure and high potassium. The patient was evaluated with ultrasound, which showed no evidence of obstruction. She was treated with IV fluids, in terms of her renal function, and her creatinine did improve. She also had hyperkalemia that resolved with 1 dose of Kayexalate. She was also noticed to have E. coli UTI, which was treated with antibiotics, and now being discharged on p.o. antibiotics. She will have a close outpatient followup. The  patient also may need further evaluation for the hepatic lesion that was noted. At this time, she is doing much better and is stable for discharge.   DISCHARGE MEDICATIONS: Carbidopa/levodopa 25/250 mg 1/2 tab 4 times a day for Parkinson's, pramipexole 1 mg t.i.d. Aranesp 300 mcg, 1 mcg injection every 2 weeks, aspirin 81 mg 1 tab p.o. daily, iron 65 mg daily, Cipro 250 mg 1 tab p.o. q.12 x 5 days.   DIET: Low sodium.   ACTIVITY: As tolerated.   Follow with Dr. Inocente Salles  next Monday.  Follow with primary M.D. in 1 to 2 weeks.     TIME SPENT: 35 minutes.   ____________________________ Lafonda Mosses Posey Pronto, MD shp:dmm D: 11/24/2013 13:07:00 ET T: 11/24/2013 13:16:37 ET JOB#: [id number]  cc: Beck Cofer H. Posey Pronto, MD, <Dictator> Alric Seton MD ELECTRONICALLY SIGNED 11/25/2013 14:33

## 2014-12-05 ENCOUNTER — Ambulatory Visit (INDEPENDENT_AMBULATORY_CARE_PROVIDER_SITE_OTHER): Payer: Medicare Other | Admitting: Neurology

## 2014-12-05 ENCOUNTER — Encounter: Payer: Self-pay | Admitting: Neurology

## 2014-12-05 VITALS — BP 118/62 | HR 72 | Ht 59.0 in | Wt 128.6 lb

## 2014-12-05 DIAGNOSIS — R269 Unspecified abnormalities of gait and mobility: Secondary | ICD-10-CM

## 2014-12-05 DIAGNOSIS — G20 Parkinson's disease: Secondary | ICD-10-CM

## 2014-12-05 MED ORDER — DONEPEZIL HCL 5 MG PO TABS: 5.0000 mg | ORAL_TABLET | Freq: Every day | ORAL | Status: DC

## 2014-12-05 NOTE — Patient Instructions (Signed)
Parkinson Disease Parkinson disease is a disorder of the central nervous system, which includes the brain and spinal cord. A person with this disease slowly loses the ability to completely control body movements. Within the brain, there is a group of nerve cells (basal ganglia) that help control movement. The basal ganglia are damaged and do not work properly in a person with Parkinson disease. In addition, the basal ganglia produce and use a brain chemical called dopamine. The dopamine chemical sends messages to other parts of the body to control and coordinate body movements. Dopamine levels are low in a person with Parkinson disease. If the dopamine levels are low, then the body does not receive the correct messages it needs to move normally.  CAUSES  The exact reason why the basal ganglia get damaged is not known. Some medical researchers have thought that infection, genes, environment, and certain medicines may contribute to the cause.  SYMPTOMS   An early symptom of Parkinson disease is often an uncontrolled shaking (tremor) of the hands. The tremor will often disappear when the affected hand is consciously used.  As the disease progresses, walking, talking, getting out of a chair, and new movements become more difficult.  Muscles get stiff and movements become slower.  Balance and coordination become harder.  Depression, trouble swallowing, urinary problems, constipation, and sleep problems can occur.  Later in the disease, memory and thought processes may deteriorate. DIAGNOSIS  There are no specific tests to diagnose Parkinson disease. You may be referred to a neurologist for evaluation. Your caregiver will ask about your medical history, symptoms, and perform a physical exam. Blood tests and imaging tests of your brain may be performed to rule out other diseases. The imaging tests may include an MRI or a CT scan. TREATMENT  The goal of treatment is to relieve symptoms. Medicines may be  prescribed once the symptoms become troublesome. Medicine will not stop the progression of the disease, but medicine can make movement and balance better and help control tremors. Speech and occupational therapy may also be prescribed. Sometimes, surgical treatment of the brain can be done in young people. HOME CARE INSTRUCTIONS  Get regular exercise and rest periods during the day to help prevent exhaustion and depression.  If getting dressed becomes difficult, replace buttons and zippers with Velcro and elastic on your clothing.  Take all medicine as directed by your caregiver.  Install grab bars or railings in your home to prevent falls.  Go to speech or occupational therapy as directed.  Keep all follow-up visits as directed by your caregiver. SEEK MEDICAL CARE IF:  Your symptoms are not controlled with your medicine.  You fall.  You have trouble swallowing or choke on your food. MAKE SURE YOU:  Understand these instructions.  Will watch your condition.  Will get help right away if you are not doing well or get worse. Document Released: 06/05/2000 Document Revised: 10/03/2012 Document Reviewed: 07/08/2011 [employer] Patient Information 2015 [employer]. This information is not intended to replace advice given to you by your health care provider. Make sure you discuss any questions you have with your health care provider.  

## 2014-12-05 NOTE — Progress Notes (Signed)
Reason for visit: Parkinson's disease  Brianna Coleman is an 74 y.o. female  History of present illness:  Brianna Coleman is a 74 year old right-handed white female with a history of Parkinson's disease and a mild memory disorder. The patient has remained active with her Parkinson's disease. She denies any falls, and she remains active, working out at least twice a week at [employer], and she walks on a regular basis. She uses a cane for ambulation. She has occasional problems with choking, but this is not a significant issue for her. She is reporting some increasing problems with memory, some word finding problems. Her caretaker has noted that she will rock on occasion, the patient herself is unaware of this. She is tolerating the increased dose of Sinemet relatively well. She returns for an evaluation.  Past Medical History  Diagnosis Date  . Parkinson disease   . Dyslipidemia   . Hypertension   . Arthritis, degenerative   . Gastroesophageal reflux disease   . RLS (restless legs syndrome)   . Neurogenic bladder   . Dysphagia   . Anemia   . Memory disturbance   . Episode of syncope     Near-syncope  . Gait disorder   . Heart murmur   . Stroke     no residual  . Shortness of breath     with exertion  . Blood in stool   . History of blood transfusion   . Aortic stenosis     mild AS by 03/20/13 echo [facility])  . Chronic renal insufficiency     CKD stage IV, sees Dr Holley Raring in [place]  . 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 Cataract extraction, bilateral    . Vaginal cyst removal  2009  . Colonoscopy    . Upper gi endoscopy    . Eye surgery Bilateral     cataract  . Endarterectomy Right 09/19/2013    Procedure: ENDARTERECTOMY CAROTID WITH PATCH ANGIOPLASTY;  Surgeon: Elam Dutch, MD;  Location: [facility] OR;  Service: Vascular;  Laterality: Right;  . Carotid endarterectomy      Family History  Problem  Relation Age of Onset  . Cancer Mother   . Hypertension Mother   . Heart disease Mother   . Hyperlipidemia Mother   . Heart attack Father   . Hypertension Father   . Diabetes Brother   . Diabetes Brother   . Heart disease Brother   . Hyperlipidemia Brother   . Hypertension Brother   . Varicose Veins Brother   . Hypertension Sister     Social history:  reports that she has never smoked. She has never used smokeless tobacco. She reports that she does not drink alcohol or use illicit drugs.   No Known Allergies  Medications:  Prior to Admission medications   Medication Sig Start Date End Date Taking? Authorizing Provider  aspirin 81 MG tablet Take 81 mg by mouth daily.   Yes Historical Provider, MD  carbidopa-levodopa (SINEMET IR) 25-250 MG per tablet One tablet in the morning and at 4 pm, one half tablet at noon and at night 07/16/14  Yes Kathrynn Ducking, MD  cholecalciferol (VITAMIN D) 1000 UNITS tablet Take 1,000 Units by mouth daily.   Yes Historical Provider, MD  conjugated estrogens (PREMARIN) vaginal cream Place 1 Applicatorful vaginally at bedtime. 12/07/13  Yes Historical Provider, MD  ferrous sulfate 325 (65 FE) MG tablet Take 325  mg by mouth daily with breakfast.   Yes Historical Provider, MD  HYDROcodone-acetaminophen (NORCO/VICODIN) 5-325 MG per tablet Take 1 tablet by mouth every 6 (six) hours as needed. 07/05/14  Yes Kathrynn Ducking, MD  NIFEdipine (PROCARDIA-XL/ADALAT CC) 30 MG 24 hr tablet Take 30 mg by mouth daily. 05/22/14  Yes Historical Provider, MD  pramipexole (MIRAPEX) 1 MG tablet Take 1 tablet (1 mg total) by mouth 3 (three) times daily. 07/05/14  Yes Kathrynn Ducking, MD    ROS:  Out of a complete 14 system review of symptoms, the patient complains only of the following symptoms, and all other reviewed systems are negative.  Memory loss Muscle cramps Mild gait disorder  Blood pressure 118/62, pulse 72, height 4\' 11"  (1.499 m), weight 128 lb 9.6 oz (58.333  kg).  Physical Exam  General: The patient is alert and cooperative at the time of the examination.  Skin: 1+ edema of ankles is noted bilaterally.   Neurologic Exam  Mental status: The patient is alert and oriented x 3 at the time of the examination. The patient has apparent normal recent and remote memory, with an apparently normal attention span and concentration ability.   Cranial nerves: Facial symmetry is present. Speech is normal, no aphasia or dysarthria is noted. Extraocular movements are full. Visual fields are full. Mild masking of the face is seen.  Motor: The patient has good strength in all 4 extremities.  Sensory examination: Soft touch sensation is symmetric on the face, arms, and legs.  Coordination: The patient has good finger-nose-finger and heel-to-shin bilaterally.  Gait and station: The patient has a normal gait. The patient is able to stand without assistance with the arms crossed. Once up, the patient walks without assistance, there is decreased arm swing on the left, and some dystonic posturing of the left arm. Tandem gait is slightly unsteady. Romberg is negative. No drift is seen.  Reflexes: Deep tendon reflexes are symmetric.   Assessment/Plan:  1. Parkinson's disease  2. Mild memory disorder  3. Mild gait disorder  The patient is having some mild issues with dyskinesias, she is having some mild dystonia involving the left arm. The patient is having some increasing problems with a memory issue. We have discussed going on Aricept, I will call the medication in. The patient will follow-up in about 4 months.  Jill Alexanders MD 12/05/2014 7:52 PM  [facility] [address] [place], [place] [address]  Phone [phone] Fax [phone]

## 2015-02-01 ENCOUNTER — Other Ambulatory Visit: Payer: Self-pay

## 2015-02-01 MED ORDER — DONEPEZIL HCL 5 MG PO TABS: 5.0000 mg | ORAL_TABLET | Freq: Every day | ORAL | Status: DC

## 2015-04-15 ENCOUNTER — Telehealth: Payer: Self-pay

## 2015-04-15 NOTE — Telephone Encounter (Signed)
Called patient to offer earlier appt w/ NP-Megan. No answer.

## 2015-04-22 ENCOUNTER — Ambulatory Visit (INDEPENDENT_AMBULATORY_CARE_PROVIDER_SITE_OTHER): Payer: Medicare Other | Admitting: Neurology

## 2015-04-22 ENCOUNTER — Encounter: Payer: Self-pay | Admitting: Neurology

## 2015-04-22 VITALS — BP 186/73 | HR 63 | Ht 60.0 in | Wt 132.5 lb

## 2015-04-22 DIAGNOSIS — G4752 REM sleep behavior disorder: Secondary | ICD-10-CM

## 2015-04-22 DIAGNOSIS — R269 Unspecified abnormalities of gait and mobility: Secondary | ICD-10-CM | POA: Diagnosis not present

## 2015-04-22 DIAGNOSIS — G20 Parkinson's disease: Secondary | ICD-10-CM | POA: Diagnosis not present

## 2015-04-22 MED ORDER — DONEPEZIL HCL 10 MG PO TABS: 10.0000 mg | ORAL_TABLET | Freq: Every day | ORAL | Status: DC

## 2015-04-22 MED ORDER — HYDROCODONE-ACETAMINOPHEN 5-325 MG PO TABS: 1.0000 | ORAL_TABLET | Freq: Four times a day (QID) | ORAL | Status: DC | PRN

## 2015-04-22 NOTE — Patient Instructions (Addendum)
   We will go up on the aricept (donepezil) to 10 mg at night.   Parkinson Disease Parkinson disease is a disorder of the central nervous system, which includes the brain and spinal cord. A person with this disease slowly loses the ability to completely control body movements. Within the brain, there is a group of nerve cells (basal ganglia) that help control movement. The basal ganglia are damaged and do not work properly in a person with Parkinson disease. In addition, the basal ganglia produce and use a brain chemical called dopamine. The dopamine chemical sends messages to other parts of the body to control and coordinate body movements. Dopamine levels are low in a person with Parkinson disease. If the dopamine levels are low, then the body does not receive the correct messages it needs to move normally.  CAUSES  The exact reason why the basal ganglia get damaged is not known. Some medical researchers have thought that infection, genes, environment, and certain medicines may contribute to the cause.  SYMPTOMS   An early symptom of Parkinson disease is often an uncontrolled shaking (tremor) of the hands. The tremor will often disappear when the affected hand is consciously used.  As the disease progresses, walking, talking, getting out of a chair, and new movements become more difficult.  Muscles get stiff and movements become slower.  Balance and coordination become harder.  Depression, trouble swallowing, urinary problems, constipation, and sleep problems can occur.  Later in the disease, memory and thought processes may deteriorate. DIAGNOSIS  There are no specific tests to diagnose Parkinson disease. You may be referred to a neurologist for evaluation. Your caregiver will ask about your medical history, symptoms, and perform a physical exam. Blood tests and imaging tests of your brain may be performed to rule out other diseases. The imaging tests may include an MRI or a CT  scan. TREATMENT  The goal of treatment is to relieve symptoms. Medicines may be prescribed once the symptoms become troublesome. Medicine will not stop the progression of the disease, but medicine can make movement and balance better and help control tremors. Speech and occupational therapy may also be prescribed. Sometimes, surgical treatment of the brain can be done in young people. HOME CARE INSTRUCTIONS  Get regular exercise and rest periods during the day to help prevent exhaustion and depression.  If getting dressed becomes difficult, replace buttons and zippers with Velcro and elastic on your clothing.  Take all medicine as directed by your caregiver.  Install grab bars or railings in your home to prevent falls.  Go to speech or occupational therapy as directed.  Keep all follow-up visits as directed by your caregiver. SEEK MEDICAL CARE IF:  Your symptoms are not controlled with your medicine.  You fall.  You have trouble swallowing or choke on your food. MAKE SURE YOU:  Understand these instructions.  Will watch your condition.  Will get help right away if you are not doing well or get worse.   This information is not intended to replace advice given to you by your health care provider. Make sure you discuss any questions you have with your health care provider.   Document Released: 06/05/2000 Document Revised: 10/03/2012 Document Reviewed: 07/08/2011 Elsevier Interactive Patient Education [employer].

## 2015-04-22 NOTE — Progress Notes (Signed)
Reason for visit:  Parkinson's disease  Brianna Coleman is an 74 y.o. female  History of present illness:   Brianna Coleman is a 74 year old right-handed white female with a history of Parkinson's disease. The patient has done relatively well in terms her functional level since last seen. The patient remains active, and she exercises on a regular basis. She uses a cane for ambulation, and no recent falls have been noted. She has fallen in the past and she has some low back pain following this. X-ray of the low back did show chronic compression fractures. The patient reports some shortness of breath and dyspnea on exertion, she will have some wheezing at times, this tends to be worse if she is upset or nervous about something. The patient has a history of dysphagia, but she reports that she will choke only on occasion. She is functioning at about the same level she has been previously. She is on low-dose Aricept for memory, she tolerates the medication well. The patient is sleeping well at night. She returns for an evaluation. The patient does have a rocking movements associated with dyskinesias. Dystonias of the arms while walking have been noted.  Past Medical History  Diagnosis Date  . Parkinson disease ([place])   . Dyslipidemia   . Hypertension   . Arthritis, degenerative   . Gastroesophageal reflux disease   . RLS (restless legs syndrome)   . Neurogenic bladder   . Dysphagia   . Anemia   . Memory disturbance   . Episode of syncope     Near-syncope  . Gait disorder   . Heart murmur   . Stroke [facility])     no residual  . Shortness of breath     with exertion  . Blood in stool   . History of blood transfusion   . Aortic stenosis     mild AS by 03/20/13 echo [facility])  . Chronic renal insufficiency     CKD stage IV, sees Dr Holley Raring in [place]  . 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 Cataract extraction, bilateral     . Vaginal cyst removal  2009  . Colonoscopy    . Upper gi endoscopy    . Eye surgery Bilateral     cataract  . Endarterectomy Right 09/19/2013    Procedure: ENDARTERECTOMY CAROTID WITH PATCH ANGIOPLASTY;  Surgeon: Elam Dutch, MD;  Location: [facility] OR;  Service: Vascular;  Laterality: Right;  . Carotid endarterectomy      Family History  Problem Relation Age of Onset  . Cancer Mother   . Hypertension Mother   . Heart disease Mother   . Hyperlipidemia Mother   . Heart attack Father   . Hypertension Father   . Diabetes Brother   . Diabetes Brother   . Heart disease Brother   . Hyperlipidemia Brother   . Hypertension Brother   . Varicose Veins Brother   . Hypertension Sister     Social history:  reports that she has never smoked. She has never used smokeless tobacco. She reports that she does not drink alcohol or use illicit drugs.   No Known Allergies  Medications:  Prior to Admission medications   Medication Sig Start Date End Date Taking? Authorizing Provider  aspirin 81 MG tablet Take 81 mg by mouth daily.   Yes Historical Provider, MD  carbidopa-levodopa (SINEMET IR) 25-250 MG per tablet One tablet in the morning  and at 4 pm, one half tablet at noon and at night 07/16/14  Yes Kathrynn Ducking, MD  cholecalciferol (VITAMIN D) 1000 UNITS tablet Take 1,000 Units by mouth daily.   Yes Historical Provider, MD  conjugated estrogens (PREMARIN) vaginal cream Place 1 Applicatorful vaginally at bedtime. 12/07/13  Yes Historical Provider, MD  donepezil (ARICEPT) 5 MG tablet Take 1 tablet (5 mg total) by mouth at bedtime. 02/01/15  Yes Kathrynn Ducking, MD  ferrous sulfate 325 (65 FE) MG tablet Take 325 mg by mouth daily with breakfast.   Yes Historical Provider, MD  HYDROcodone-acetaminophen (NORCO/VICODIN) 5-325 MG per tablet Take 1 tablet by mouth every 6 (six) hours as needed. 07/05/14  Yes Kathrynn Ducking, MD  pramipexole (MIRAPEX) 1 MG tablet Take 1 tablet (1 mg total) by mouth  3 (three) times daily. 07/05/14  Yes Kathrynn Ducking, MD    ROS:  Out of a complete 14 system review of symptoms, the patient complains only of the following symptoms, and all other reviewed systems are negative.   Fatigue  Wheezing, shortness of breath  Leg swelling, heart murmur  Constipation  Restless legs  Sleep talking  Inconstant bladder, frequency of urination  Low back pain  Memory problem  Blood pressure 186/73, pulse 63, height 5' (1.524 m), weight 132 lb 8 oz (60.102 kg).  Physical Exam  General: The patient is alert and cooperative at the time of the examination.  Skin:  1+ edema at ankles is noted bilaterally.   Neurologic Exam  Mental status: The patient is alert and oriented x 3 at the time of the examination. The patient has apparent normal recent and remote memory, with an apparently normal attention span and concentration ability.   Cranial nerves: Facial symmetry is present. Speech is normal, no aphasia or dysarthria is noted. Extraocular movements are full. Visual fields are full.  Motor: The patient has good strength in all 4 extremities.  Sensory examination: Soft touch sensation is symmetric on the face, arms, and legs.  Coordination: The patient has good finger-nose-finger and heel-to-shin bilaterally. The patient does have some rocking movements while sitting.  Gait and station: The patient has inability to standing from seated position with arms crossed. Once up, the patient can walk independently, she usually uses a cane. With walking, dystonic posturing of the arms is seen. Tandem gait is  Slightly unsteady. Romberg is negative. No drift is seen.  Reflexes: Deep tendon reflexes are symmetric.   Assessment/Plan:   1. Parkinson's disease   2. Gait disturbance   3. Memory disturbance   The patient seems to be functioning relatively well since last seen. The patient will be increased on Aricept taking 10 mg at night. She currently does not  have a primary care physician, she will be seeking a new physician in the near future. A prescription was given for her hydrocodone for her back pain , and for the Aricept. She will follow-up in 4 months.  Jill Alexanders MD 04/22/2015 8:36 PM  [facility] [address] [place], [place] [address]  Phone [phone] Fax [phone]

## 2015-05-27 ENCOUNTER — Other Ambulatory Visit: Payer: Self-pay | Admitting: Neurology

## 2015-07-29 ENCOUNTER — Telehealth: Payer: Self-pay | Admitting: Neurology

## 2015-07-29 NOTE — Telephone Encounter (Signed)
I called the patient's daughter, Adonis Brook. She stated Friday morning the patient had an episode of confusion. She could not zip her coat and she could not speak. This lasted about 30 minutes but then resolved. She has been fine all weekend and is fine right now. I advised that I would check with Dr. Jannifer Franklin to see if he wants the patient to come in and see him this week or go to the ED. I advised we would call them back.

## 2015-07-29 NOTE — Telephone Encounter (Signed)
I called the daughter. The patient had a transient episode of confusion , no obvious facial droop or arm weakness. The episode lasted about 30 minutes, then resolved. This could have been a TIA , possibly a seizure. I will get patient for evaluation this week. The patient is on aspirin. She has a history of right carotid artery stenosis. This will need to be reevaluated.

## 2015-07-29 NOTE — Telephone Encounter (Signed)
Just and FYI: pts son called in and wanted to make appt for his mother, the pt. I asked why and he says he thinks she had a stroke on Friday. She is not showing any more symptoms. I advised him that she needs to be seen in the ER. He expressed understanding and hung up.

## 2015-07-29 NOTE — Telephone Encounter (Signed)
I called the patient's daughter. Appointment scheduled 2/8 at 8 AM.

## 2015-07-31 ENCOUNTER — Encounter: Payer: Self-pay | Admitting: Neurology

## 2015-07-31 ENCOUNTER — Ambulatory Visit (INDEPENDENT_AMBULATORY_CARE_PROVIDER_SITE_OTHER): Payer: Medicare Other | Admitting: Neurology

## 2015-07-31 ENCOUNTER — Telehealth: Payer: Self-pay | Admitting: Neurology

## 2015-07-31 VITALS — BP 126/76 | HR 72 | Ht 60.0 in | Wt 128.5 lb

## 2015-07-31 DIAGNOSIS — R404 Transient alteration of awareness: Secondary | ICD-10-CM

## 2015-07-31 DIAGNOSIS — G20 Parkinson's disease: Secondary | ICD-10-CM | POA: Diagnosis not present

## 2015-07-31 DIAGNOSIS — G4752 REM sleep behavior disorder: Secondary | ICD-10-CM | POA: Diagnosis not present

## 2015-07-31 DIAGNOSIS — R269 Unspecified abnormalities of gait and mobility: Secondary | ICD-10-CM | POA: Diagnosis not present

## 2015-07-31 NOTE — Telephone Encounter (Signed)
Called patient and daughter [name] and left them both messages relaying . The office will give them a call in the next few weeks to get her scheduled for doppler. If any question's please return phone call.

## 2015-07-31 NOTE — Patient Instructions (Addendum)
We will check MRI of the brain and get a carotid doppler study and an EEG.   Fall Prevention in the Home  Falls can cause injuries and can affect people from all age groups. There are many simple things that you can do to make your home safe and to help prevent falls. WHAT CAN I DO ON THE OUTSIDE OF MY HOME?  Regularly repair the edges of walkways and driveways and fix any cracks.  Remove high doorway thresholds.  Trim any shrubbery on the main path into your home.  Use bright outdoor lighting.  Clear walkways of debris and clutter, including tools and rocks.  Regularly check that handrails are securely fastened and in good repair. Both sides of any steps should have handrails.  Install guardrails along the edges of any raised decks or porches.  Have leaves, snow, and ice cleared regularly.  Use sand or salt on walkways during winter months.  In the garage, clean up any spills right away, including grease or oil spills. WHAT CAN I DO IN THE BATHROOM?  Use night lights.  Install grab bars by the toilet and in the tub and shower. Do not use towel bars as grab bars.  Use non-skid mats or decals on the floor of the tub or shower.  If you need to sit down while you are in the shower, use a plastic, non-slip stool.Marland Kitchen  Keep the floor dry. Immediately clean up any water that spills on the floor.  Remove soap buildup in the tub or shower on a regular basis.  Attach bath mats securely with double-sided non-slip rug tape.  Remove throw rugs and other tripping hazards from the floor. WHAT CAN I DO IN THE BEDROOM?  Use night lights.  Make sure that a bedside light is easy to reach.  Do not use oversized bedding that drapes onto the floor.  Have a firm chair that has side arms to use for getting dressed.  Remove throw rugs and other tripping hazards from the floor. WHAT CAN I DO IN THE KITCHEN?   Clean up any spills right away.  Avoid walking on wet floors.  Place  frequently used items in easy-to-reach places.  If you need to reach for something above you, use a sturdy step stool that has a grab bar.  Keep electrical cables out of the way.  Do not use floor polish or wax that makes floors slippery. If you have to use wax, make sure that it is non-skid floor wax.  Remove throw rugs and other tripping hazards from the floor. WHAT CAN I DO IN THE STAIRWAYS?  Do not leave any items on the stairs.  Make sure that there are handrails on both sides of the stairs. Fix handrails that are broken or loose. Make sure that handrails are as long as the stairways.  Check any carpeting to make sure that it is firmly attached to the stairs. Fix any carpet that is loose or worn.  Avoid having throw rugs at the top or bottom of stairways, or secure the rugs with carpet tape to prevent them from moving.  Make sure that you have a light switch at the top of the stairs and the bottom of the stairs. If you do not have them, have them installed. WHAT ARE SOME OTHER FALL PREVENTION TIPS?  Wear closed-toe shoes that fit well and support your feet. Wear shoes that have rubber soles or low heels.  When you use a stepladder, make sure that  it is completely opened and that the sides are firmly locked. Have someone hold the ladder while you are using it. Do not climb a closed stepladder.  Add color or contrast paint or tape to grab bars and handrails in your home. Place contrasting color strips on the first and last steps.  Use mobility aids as needed, such as canes, walkers, scooters, and crutches.  Turn on lights if it is dark. Replace any light bulbs that burn out.  Set up furniture so that there are clear paths. Keep the furniture in the same spot.  Fix any uneven floor surfaces.  Choose a carpet design that does not hide the edge of steps of a stairway.  Be aware of any and all pets.  Review your medicines with your healthcare provider. Some medicines can cause  dizziness or changes in blood pressure, which increase your risk of falling. Talk with your health care provider about other ways that you can decrease your risk of falls. This may include working with a physical therapist or trainer to improve your strength, balance, and endurance.   This information is not intended to replace advice given to you by your health care provider. Make sure you discuss any questions you have with your health care provider.   Document Released: 05/29/2002 Document Revised: 10/23/2014 Document Reviewed: 07/13/2014 Elsevier Interactive Patient Education [employer].
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
## 2015-08-01 ENCOUNTER — Other Ambulatory Visit: Payer: Self-pay | Admitting: Neurology

## 2015-08-05 ENCOUNTER — Telehealth: Payer: Self-pay | Admitting: Neurology

## 2015-08-05 ENCOUNTER — Ambulatory Visit
Admission: RE | Admit: 2015-08-05 | Discharge: 2015-08-05 | Disposition: A | Discharge status: Home / Self Care | Payer: Medicare Other | Source: Ambulatory Visit | Attending: Neurology | Admitting: Neurology

## 2015-08-05 ENCOUNTER — Ambulatory Visit (INDEPENDENT_AMBULATORY_CARE_PROVIDER_SITE_OTHER): Payer: Medicare Other | Admitting: Neurology

## 2015-08-05 DIAGNOSIS — I63131 Cerebral infarction due to embolism of right carotid artery: Secondary | ICD-10-CM

## 2015-08-05 DIAGNOSIS — G4752 REM sleep behavior disorder: Secondary | ICD-10-CM

## 2015-08-05 DIAGNOSIS — R410 Disorientation, unspecified: Secondary | ICD-10-CM | POA: Diagnosis not present

## 2015-08-05 DIAGNOSIS — R269 Unspecified abnormalities of gait and mobility: Secondary | ICD-10-CM

## 2015-08-05 DIAGNOSIS — G20 Parkinson's disease: Secondary | ICD-10-CM

## 2015-08-05 DIAGNOSIS — R404 Transient alteration of awareness: Secondary | ICD-10-CM

## 2015-08-05 NOTE — Telephone Encounter (Signed)
I called the patient, talk with the family. MRI the brain shows evidence of a subacute right brain stroke mainly involving the frontal lobe and caudate head. The patient is to remain on aspirin, a carotid Doppler study has already been ordered, I will set her up for a 2-D echocardiogram at [facility].  The patient has a cardiologist, Dr. Josefa Half.

## 2015-08-05 NOTE — Procedures (Signed)
    History:  Brianna Coleman is a 75 year old patient with a history of Parkinson's disease who had an episode of speech arrest and confusion, amnesia on 07/26/2015. The episode lasted about one half hour, then fully resolved. The patient is being evaluated for a possible seizure event.  This is a routine EEG. No skull defects are noted. Medications include aspirin, Sinemet, vitamin D, Premarin, Aricept, iron supplementation, hydrocodone, and Mirapex.   EEG classification: Essentially normal awake  Description of the recording: The background rhythms of this recording consists of a fairly well modulated medium amplitude alpha rhythm of 8 Hz that is reactive to eye opening and closure. As the record progresses, the patient appears to remain in the waking state throughout the recording. Photic stimulation was performed, resulting in a bilateral and symmetric photic driving response. Hyperventilation was also performed, resulting in a minimal buildup of the background rhythm activities without significant slowing seen. At no time during the recording does there appear to be evidence of spike or spike wave discharges or evidence of focal slowing. EKG monitor shows no evidence of cardiac rhythm abnormalities with a heart rate of 60.  Impression: This is an essentially normal EEG recording in the waking state. No evidence of ictal or interictal discharges are seen.

## 2015-08-05 NOTE — Telephone Encounter (Signed)
I called the patient, talk with the family. EEG study was normal. The patient is getting the MRI the brain now, I will call when I get the results.

## 2015-08-06 NOTE — Telephone Encounter (Signed)
Pt' daughter Otila Kluver called cardiologist office and was told 2-D echocardiogram could be done at her appt on Monday 08/12/15. Please fax order to [phone].

## 2015-08-06 NOTE — Telephone Encounter (Signed)
Order has been faxed to number provided .

## 2015-08-12 DIAGNOSIS — I639 Cerebral infarction, unspecified: Secondary | ICD-10-CM | POA: Insufficient documentation

## 2015-08-15 ENCOUNTER — Ambulatory Visit (INDEPENDENT_AMBULATORY_CARE_PROVIDER_SITE_OTHER): Payer: Medicare Other

## 2015-08-15 DIAGNOSIS — G4752 REM sleep behavior disorder: Secondary | ICD-10-CM

## 2015-08-15 DIAGNOSIS — R404 Transient alteration of awareness: Secondary | ICD-10-CM | POA: Diagnosis not present

## 2015-08-15 DIAGNOSIS — G20 Parkinson's disease: Secondary | ICD-10-CM

## 2015-08-15 DIAGNOSIS — R269 Unspecified abnormalities of gait and mobility: Secondary | ICD-10-CM

## 2015-08-21 ENCOUNTER — Other Ambulatory Visit: Payer: Self-pay | Admitting: Neurology

## 2015-09-06 ENCOUNTER — Ambulatory Visit (INDEPENDENT_AMBULATORY_CARE_PROVIDER_SITE_OTHER): Payer: Medicare Other | Admitting: Neurology

## 2015-09-06 ENCOUNTER — Encounter: Payer: Self-pay | Admitting: Neurology

## 2015-09-06 VITALS — BP 132/64 | HR 66 | Resp 16 | Ht 60.0 in | Wt 129.0 lb

## 2015-09-06 DIAGNOSIS — G4752 REM sleep behavior disorder: Secondary | ICD-10-CM | POA: Diagnosis not present

## 2015-09-06 DIAGNOSIS — R269 Unspecified abnormalities of gait and mobility: Secondary | ICD-10-CM | POA: Diagnosis not present

## 2015-09-06 DIAGNOSIS — G20 Parkinson's disease: Secondary | ICD-10-CM

## 2015-09-06 DIAGNOSIS — I63131 Cerebral infarction due to embolism of right carotid artery: Secondary | ICD-10-CM

## 2015-09-06 HISTORY — DX: Cerebral infarction due to embolism of right carotid artery: I63.131

## 2015-09-06 NOTE — Progress Notes (Signed)
Reason for visit: Parkinson's disease  Brianna Coleman is an 75 y.o. female  History of present illness:  Brianna Coleman is a 75 year old right-handed white female with a history of Parkinson's disease. The patient had episodes of altered awareness around 07/26/2015. EEG study was unremarkable, but MRI of the brain showed evidence of a subacute right frontal and caudate head stroke. The patient has a history of prior right carotid endarterectomy. She is on aspirin therapy. She has undergone a 2-D echocardiogram, I do not have the results of this, but her cardiologist told her that it was unremarkable. She was placed on a 24 hour Holter monitor, no evidence of atrial fibrillation was noted. The patient has not had any further events. She does have some alteration in her walking, she will occasionally scissor her left leg across the right, and stumble. She uses a cane for ambulation. She has not had any falls. She reports no overt numbness or weakness of the extremities, no visual changes. The carotid Doppler study has been done, but the result is still pending.  Past Medical History  Diagnosis Date  . Parkinson disease ([place])   . Dyslipidemia   . Hypertension   . Arthritis, degenerative   . Gastroesophageal reflux disease   . RLS (restless legs syndrome)   . Neurogenic bladder   . Dysphagia   . Anemia   . Memory disturbance   . Episode of syncope     Near-syncope  . Gait disorder   . Heart murmur   . Stroke [facility])     no residual  . Shortness of breath     with exertion  . Blood in stool   . History of blood transfusion   . Aortic stenosis     mild AS by 03/20/13 echo Brianna Coleman)  . Chronic renal insufficiency     CKD stage IV, sees Brianna Coleman in [place]  . 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 Cataract extraction, bilateral    . Vaginal cyst  removal  2009  . Colonoscopy    . Upper gi endoscopy    . Eye surgery Bilateral     cataract  . Endarterectomy Right 09/19/2013    Procedure: ENDARTERECTOMY CAROTID WITH PATCH ANGIOPLASTY;  Surgeon: Brianna Dutch, MD;  Location: [facility] OR;  Service: Vascular;  Laterality: Right;  . Carotid endarterectomy      Family History  Problem Relation Age of Onset  . Cancer Mother   . Hypertension Mother   . Heart disease Mother   . Hyperlipidemia Mother   . Heart attack Father   . Hypertension Father   . Diabetes Brother   . Diabetes Brother   . Heart disease Brother   . Hyperlipidemia Brother   . Hypertension Brother   . Varicose Veins Brother   . Hypertension Sister     Social history:  reports that she has never smoked. She has never used smokeless tobacco. She reports that she does not drink alcohol or use illicit drugs.   No Known Allergies  Medications:  Prior to Admission medications   Medication Sig Start Date End Date Taking? Authorizing Provider  aspirin 81 MG tablet Take 81 mg by mouth daily.   Yes Historical Provider, MD  carbidopa-levodopa (SINEMET IR) 25-250 MG tablet TAKE 1 TABLET EVERY MORNING AND 1 TAB AT 4PM & 1/2 TAB  AT [facility] AND 1/2 TAB AT NIGHT 08/21/15  Yes Brianna Ducking, MD  cholecalciferol (VITAMIN D) 1000 UNITS tablet Take 1,000 Units by mouth daily.   Yes Historical Provider, MD  conjugated estrogens (PREMARIN) vaginal cream Place 1 Applicatorful vaginally at bedtime. 12/07/13  Yes Historical Provider, MD  donepezil (ARICEPT) 10 MG tablet Take 1 tablet (10 mg total) by mouth at bedtime. 04/22/15  Yes Brianna Ducking, MD  enalapril (VASOTEC) 20 MG tablet Take 20 mg by mouth daily.   Yes Historical Provider, MD  ferrous sulfate 325 (65 FE) MG tablet Take 325 mg by mouth daily with breakfast.   Yes Historical Provider, MD  HYDROcodone-acetaminophen (NORCO/VICODIN) 5-325 MG tablet Take 1 tablet by mouth every 6 (six) hours as needed. 04/22/15  Yes Brianna Ducking,  MD  pramipexole (MIRAPEX) 1 MG tablet TAKE 1 TABLET BY MOUTH 3 TIMES A DAY 08/01/15  Yes Brianna Ducking, MD    ROS:  Out of a complete 14 system review of symptoms, the patient complains only of the following symptoms, and all other reviewed systems are negative.  Gait disorder  Blood pressure 132/64, pulse 66, resp. rate 16, height 5' (1.524 m), weight 129 lb (58.514 kg).  Physical Exam  General: The patient is alert and cooperative at the time of the examination.  Skin: No significant peripheral edema is noted.   Neurologic Exam  Mental status: The patient is alert and oriented x 3 at the time of the examination. The patient has apparent normal recent and remote memory, with an apparently normal attention span and concentration ability.   Cranial nerves: Facial symmetry is present. Speech is normal, no aphasia or dysarthria is noted. Extraocular movements are full. Visual fields are full. Masking of the face is seen.  Motor: The patient has good strength in all 4 extremities.  Sensory examination: Soft touch sensation is symmetric on the face, arms, and legs.  Coordination: The patient has good finger-nose-finger and heel-to-shin bilaterally.  Gait and station: The patient has the ability to walk independently. The patient has some dystonic posturing of the left arm, good arm swing on the right. The patient usually uses a cane for ambulation. Tandem gait was not attempted. Romberg is negative. No drift is seen.  Reflexes: Deep tendon reflexes are symmetric.    MRI brain 08/05/15:  IMPRESSION: This is an abnormal MRI of the brain without contrast showing the following: 1. Subacute stroke involving the right basal ganglia and anterior limb of the internal capsule.  2. Small chronic lacunae involving the basal ganglia and a small chronic infarction in the left parietal lobe. These were present on MRI 08/12/2013.   * MRI scan images were reviewed online. I agree  with the written report.    Assessment/Plan:  1. Parkinson's disease  2. Gait disturbance  3. Right frontal stroke  We will need to get the results of the carotid Doppler study. I will have the patient to aspirin therapy. She has minimal residual from the stroke event fortunately. She will follow-up in 4 months.  Jill Alexanders MD 09/06/2015 4:36 PM  [facility] [address] [place], [place] [address]  Phone [phone] Fax [phone]

## 2015-09-06 NOTE — Patient Instructions (Signed)
Stroke Prevention Some medical conditions and behaviors are associated with an increased chance of having a stroke. You may prevent a stroke by making healthy choices and managing medical conditions. HOW CAN I REDUCE MY RISK OF HAVING A STROKE?   Stay physically active. Get at least 30 minutes of activity on most or all days.  Do not smoke. It may also be helpful to avoid exposure to secondhand smoke.  Limit alcohol use. Moderate alcohol use is considered to be:  No more than 2 drinks per day for men.  No more than 1 drink per day for nonpregnant women.  Eat healthy foods. This involves:  Eating 5 or more servings of fruits and vegetables a day.  Making dietary changes that address high blood pressure (hypertension), high cholesterol, diabetes, or obesity.  Manage your cholesterol levels.  Making food choices that are high in fiber and low in saturated fat, trans fat, and cholesterol may control cholesterol levels.  Take any prescribed medicines to control cholesterol as directed by your health care provider.  Manage your diabetes.  Controlling your carbohydrate and sugar intake is recommended to manage diabetes.  Take any prescribed medicines to control diabetes as directed by your health care provider.  Control your hypertension.  Making food choices that are low in salt (sodium), saturated fat, trans fat, and cholesterol is recommended to manage hypertension.  Ask your health care provider if you need treatment to lower your blood pressure. Take any prescribed medicines to control hypertension as directed by your health care provider.  If you are 18-39 years of age, have your blood pressure checked every 3-5 years. If you are 40 years of age or older, have your blood pressure checked every year.  Maintain a healthy weight.  Reducing calorie intake and making food choices that are low in sodium, saturated fat, trans fat, and cholesterol are recommended to manage  weight.  Stop drug abuse.  Avoid taking birth control pills.  Talk to your health care provider about the risks of taking birth control pills if you are over 35 years old, smoke, get migraines, or have ever had a blood clot.  Get evaluated for sleep disorders (sleep apnea).  Talk to your health care provider about getting a sleep evaluation if you snore a lot or have excessive sleepiness.  Take medicines only as directed by your health care provider.  For some people, aspirin or blood thinners (anticoagulants) are helpful in reducing the risk of forming abnormal blood clots that can lead to stroke. If you have the irregular heart rhythm of atrial fibrillation, you should be on a blood thinner unless there is a good reason you cannot take them.  Understand all your medicine instructions.  Make sure that other conditions (such as anemia or atherosclerosis) are addressed. SEEK IMMEDIATE MEDICAL CARE IF:   You have sudden weakness or numbness of the face, arm, or leg, especially on one side of the body.  Your face or eyelid droops to one side.  You have sudden confusion.  You have trouble speaking (aphasia) or understanding.  You have sudden trouble seeing in one or both eyes.  You have sudden trouble walking.  You have dizziness.  You have a loss of balance or coordination.  You have a sudden, severe headache with no known cause.  You have new chest pain or an irregular heartbeat. Any of these symptoms may represent a serious problem that is an emergency. Do not wait to see if the symptoms will   go away. Get medical help at once. Call your local emergency services (911 in [place] Do not drive yourself to the hospital.   This information is not intended to replace advice given to you by your health care provider. Make sure you discuss any questions you have with your health care provider.   Document Released: 07/16/2004 Document Revised: 06/29/2014 Document Reviewed:  12/09/2012 Elsevier Interactive Patient Education 2016 Elsevier Inc.  

## 2015-09-10 ENCOUNTER — Telehealth: Payer: Self-pay | Admitting: Neurology

## 2015-09-10 NOTE — Telephone Encounter (Signed)
Carotid Doppler study done is unremarkable, less than 50% stenosis seen bilaterally.

## 2015-09-19 ENCOUNTER — Encounter: Payer: Self-pay | Admitting: Cardiology

## 2015-09-20 ENCOUNTER — Other Ambulatory Visit: Payer: Self-pay | Admitting: Neurology

## 2016-01-10 ENCOUNTER — Ambulatory Visit: Payer: Medicare Other | Admitting: Neurology

## 2016-02-05 ENCOUNTER — Ambulatory Visit (INDEPENDENT_AMBULATORY_CARE_PROVIDER_SITE_OTHER): Payer: Medicare Other | Admitting: Neurology

## 2016-02-05 ENCOUNTER — Encounter: Payer: Self-pay | Admitting: Neurology

## 2016-02-05 VITALS — BP 146/79 | HR 92 | Ht 60.0 in | Wt 135.5 lb

## 2016-02-05 DIAGNOSIS — G20 Parkinson's disease: Secondary | ICD-10-CM | POA: Diagnosis not present

## 2016-02-05 DIAGNOSIS — G4486 Cervicogenic headache: Secondary | ICD-10-CM

## 2016-02-05 DIAGNOSIS — R51 Headache: Secondary | ICD-10-CM | POA: Diagnosis not present

## 2016-02-05 DIAGNOSIS — R269 Unspecified abnormalities of gait and mobility: Secondary | ICD-10-CM

## 2016-02-05 DIAGNOSIS — R413 Other amnesia: Secondary | ICD-10-CM

## 2016-02-05 HISTORY — DX: Cervicogenic headache: G44.86

## 2016-02-05 MED ORDER — GABAPENTIN 100 MG PO CAPS: 100.0000 mg | ORAL_CAPSULE | Freq: Every day | ORAL | 1 refills | Status: DC

## 2016-02-05 MED ORDER — HYDROCODONE-ACETAMINOPHEN 5-325 MG PO TABS: 1.0000 | ORAL_TABLET | Freq: Four times a day (QID) | ORAL | 0 refills | Status: DC | PRN

## 2016-02-05 MED ORDER — CARBIDOPA-LEVODOPA 25-250 MG PO TABS: ORAL_TABLET | ORAL | Status: DC

## 2016-02-05 NOTE — Patient Instructions (Addendum)
With the Sinemet (carbidopa) 25/250 mg tablet take one in the morning and 1/2 at noon, 4 PM and at night. We will start gabapentin 100 mg at night.   Fall Prevention in the Home  Falls can cause injuries and can affect people from all age groups. There are many simple things that you can do to make your home safe and to help prevent falls. WHAT CAN I DO ON THE OUTSIDE OF MY HOME?  Regularly repair the edges of walkways and driveways and fix any cracks.  Remove high doorway thresholds.  Trim any shrubbery on the main path into your home.  Use bright outdoor lighting.  Clear walkways of debris and clutter, including tools and rocks.  Regularly check that handrails are securely fastened and in good repair. Both sides of any steps should have handrails.  Install guardrails along the edges of any raised decks or porches.  Have leaves, snow, and ice cleared regularly.  Use sand or salt on walkways during winter months.  In the garage, clean up any spills right away, including grease or oil spills. WHAT CAN I DO IN THE BATHROOM?  Use night lights.  Install grab bars by the toilet and in the tub and shower. Do not use towel bars as grab bars.  Use non-skid mats or decals on the floor of the tub or shower.  If you need to sit down while you are in the shower, use a plastic, non-slip stool.Marland Kitchen  Keep the floor dry. Immediately clean up any water that spills on the floor.  Remove soap buildup in the tub or shower on a regular basis.  Attach bath mats securely with double-sided non-slip rug tape.  Remove throw rugs and other tripping hazards from the floor. WHAT CAN I DO IN THE BEDROOM?  Use night lights.  Make sure that a bedside light is easy to reach.  Do not use oversized bedding that drapes onto the floor.  Have a firm chair that has side arms to use for getting dressed.  Remove throw rugs and other tripping hazards from the floor. WHAT CAN I DO IN THE KITCHEN?    Clean up any spills right away.  Avoid walking on wet floors.  Place frequently used items in easy-to-reach places.  If you need to reach for something above you, use a sturdy step stool that has a grab bar.  Keep electrical cables out of the way.  Do not use floor polish or wax that makes floors slippery. If you have to use wax, make sure that it is non-skid floor wax.  Remove throw rugs and other tripping hazards from the floor. WHAT CAN I DO IN THE STAIRWAYS?  Do not leave any items on the stairs.  Make sure that there are handrails on both sides of the stairs. Fix handrails that are broken or loose. Make sure that handrails are as long as the stairways.  Check any carpeting to make sure that it is firmly attached to the stairs. Fix any carpet that is loose or worn.  Avoid having throw rugs at the top or bottom of stairways, or secure the rugs with carpet tape to prevent them from moving.  Make sure that you have a light switch at the top of the stairs and the bottom of the stairs. If you do not have them, have them installed. WHAT ARE SOME OTHER FALL PREVENTION TIPS?  Wear closed-toe shoes that fit well and support your feet. Wear shoes that have  rubber soles or low heels.  When you use a stepladder, make sure that it is completely opened and that the sides are firmly locked. Have someone hold the ladder while you are using it. Do not climb a closed stepladder.  Add color or contrast paint or tape to grab bars and handrails in your home. Place contrasting color strips on the first and last steps.  Use mobility aids as needed, such as canes, walkers, scooters, and crutches.  Turn on lights if it is dark. Replace any light bulbs that burn out.  Set up furniture so that there are clear paths. Keep the furniture in the same spot.  Fix any uneven floor surfaces.  Choose a carpet design that does not hide the edge of steps of a stairway.  Be aware of any and all  pets.  Review your medicines with your healthcare provider. Some medicines can cause dizziness or changes in blood pressure, which increase your risk of falling. Talk with your health care provider about other ways that you can decrease your risk of falls. This may include working with a physical therapist or trainer to improve your strength, balance, and endurance.   This information is not intended to replace advice given to you by your health care provider. Make sure you discuss any questions you have with your health care provider.   Document Released: 05/29/2002 Document Revised: 10/23/2014 Document Reviewed: 07/13/2014 Elsevier Interactive Patient Education [employer].

## 2016-02-05 NOTE — Progress Notes (Signed)
Reason for visit: Parkinson's disease  Brianna Coleman is an 75 y.o. female  History of present illness:  Brianna Coleman is a 75 year old right-handed white female with a history of Parkinson's disease associated with a gait disorder. The patient also has a mild memory disorder, she is on Aricept for this. The patient has had a right frontal stroke in early 2017. The patient had some altered mental status with this, she has had some change in walking following the stroke. The patient has not had any falls while walking, she uses a cane for ambulation. She has fallen one month ago while sitting in the chair, she fractured the toe on the left foot. The patient has a tendency to lean to the left. The patient also has developed dyskinesias that worsened throughout the day and can be quite prominent in the evening hours. She continues to report headaches that are in the left neck and shoulder and go into the left occipital area of the head. These are daily in nature and are bothersome to her. She will take Tylenol for the headache, occasionally she will take hydrocodone. The patient has not been having hallucinations, she does have vivid dreams at night. She has not had any significant change in memory since last seen. She denies any issues with choking while swallowing. She returns for an evaluation. She has chronic renal insufficiency, the daughter indicates that her GFR is around 20.  Past Medical History:  Diagnosis Date  . Anemia   . Aortic stenosis    mild AS by 03/20/13 echo [facility])  . Arthritis, degenerative   . Blood in stool   . Chronic renal insufficiency    CKD stage IV, sees Dr Holley Raring in [place]  . Dyslipidemia   . Dysphagia   . Episode of syncope    Near-syncope  . Gait disorder   . Gastroesophageal reflux disease   . Heart murmur   . History of blood transfusion   . Hypertension   . Memory disturbance   . Neurogenic bladder   . Parkinson disease ([place])   .  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 RLS (restless legs syndrome)   . Shortness of breath    with exertion  . Stroke [facility])    no residual  . 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 CAROTID ENDARTERECTOMY    . CATARACT EXTRACTION, BILATERAL    . COLONOSCOPY    . ENDARTERECTOMY Right 09/19/2013   Procedure: ENDARTERECTOMY CAROTID WITH PATCH ANGIOPLASTY;  Surgeon: Elam Dutch, MD;  Location: [facility];  Service: Vascular;  Laterality: Right;  . EYE SURGERY Bilateral    cataract  . UPPER GI ENDOSCOPY    . vaginal cyst removal  2009    Family History  Problem Relation Age of Onset  . Cancer Mother   . Hypertension Mother   . Heart disease Mother   . Hyperlipidemia Mother   . Heart attack Father   . Hypertension Father   . Diabetes Brother   . Diabetes Brother   . Heart disease Brother   . Hyperlipidemia Brother   . Hypertension Brother   . Varicose Veins Brother   . Hypertension Sister     Social history:  reports that she has never smoked. She has never used smokeless tobacco. She reports that she does not drink alcohol or use drugs.   No Known Allergies  Medications:  Prior to Admission medications   Medication Sig Start Date End Date Taking? Authorizing Provider  aspirin 81 MG tablet Take 81 mg by mouth daily.   Yes Historical Provider, MD  carbidopa-levodopa (SINEMET IR) 25-250 MG tablet One tablet in the morning and 1/2 tablet at noon, 4 PM, and at night 02/05/16  Yes Kathrynn Ducking, MD  cholecalciferol (VITAMIN D) 1000 UNITS tablet Take 1,000 Units by mouth daily.   Yes Historical Provider, MD  conjugated estrogens (PREMARIN) vaginal cream Place 1 Applicatorful vaginally at bedtime. 12/07/13  Yes Historical Provider, MD  donepezil (ARICEPT) 10 MG tablet TAKE 1 TABLET (10 MG TOTAL) BY MOUTH AT BEDTIME. 09/20/15  Yes Kathrynn Ducking, MD  enalapril (VASOTEC) 20 MG tablet Take 20 mg by  mouth daily.   Yes Historical Provider, MD  ferrous sulfate 325 (65 FE) MG tablet Take 325 mg by mouth daily with breakfast.   Yes Historical Provider, MD  HYDROcodone-acetaminophen (NORCO/VICODIN) 5-325 MG tablet Take 1 tablet by mouth every 6 (six) hours as needed. 02/05/16  Yes Kathrynn Ducking, MD  pramipexole (MIRAPEX) 1 MG tablet TAKE 1 TABLET BY MOUTH 3 TIMES A DAY 08/01/15  Yes Kathrynn Ducking, MD  gabapentin (NEURONTIN) 100 MG capsule Take 1 capsule (100 mg total) by mouth at bedtime. 02/05/16   Kathrynn Ducking, MD    ROS:  Out of a complete 14 system review of symptoms, the patient complains only of the following symptoms, and all other reviewed systems are negative.  Gait disorder Memory disorder  Blood pressure (!) 146/79, pulse 92, height 5' (1.524 m), weight 135 lb 8 oz (61.5 kg).  Physical Exam  General: The patient is alert and cooperative at the time of the examination.  Skin: 2+ edema of the lower extremities is noted.   Neurologic Exam  Mental status: The patient is alert and oriented x 3 at the time of the examination. The patient has apparent normal recent and remote memory, with an apparently normal attention span and concentration ability.   Cranial nerves: Facial symmetry is present. Speech is normal, no aphasia or dysarthria is noted. Extraocular movements are full. Visual fields are full. Masking of the face is seen.  Motor: The patient has good strength in all 4 extremities.  Sensory examination: Soft touch sensation is symmetric on the face, arms, and legs.  Coordination: The patient has good finger-nose-finger and heel-to-shin bilaterally. The patient does have some apraxia with the use of the extremities, particularly with the left lower extremity.  Gait and station: The patient has dyskinesias involving the axial muscles, dystonia of the left arm. The patient is able to walk with a cane, slightly stooped posture noted. Tandem gait was not attempted.  Romberg is negative.  Reflexes: Deep tendon reflexes are symmetric.   Assessment/Plan:  1. Parkinson's disease  2. Memory disorder  3. Gait disorder  4. Left occipital cervicogenic headache  5. Cerebrovascular disease, right frontal stroke  The patient will be placed on very low-dose gabapentin taking 100 mg at night. The dose will be adjusted if needed. A prescription was given for hydrocodone. The patient will be reduced on the Sinemet dose given the dyskinesias which seemed to worsen throughout the day. She will go to a full 25/250 tablet in the morning, one half at noon, 4 PM, and at night. The morning dose may also be reduced in the future if the dyskinesias continue. The patient remains on Mirapex. She continues the Aricept. She  will follow-up in 4 months. The patient will be taking a trip to [place] in the near future, compression stockings are recommended during the trip.  Jill Alexanders MD 02/05/2016 8:00 AM  [facility] [address] [place], [place] [address]  Phone [phone] Fax [phone]

## 2016-03-18 ENCOUNTER — Other Ambulatory Visit: Payer: Self-pay | Admitting: Neurology

## 2016-04-06 ENCOUNTER — Telehealth: Payer: Self-pay | Admitting: Neurology

## 2016-04-06 DIAGNOSIS — G44221 Chronic tension-type headache, intractable: Secondary | ICD-10-CM

## 2016-04-06 NOTE — Addendum Note (Signed)
Addended by: Margette Fast on: 04/06/2016 05:23 PM   Modules accepted: Orders

## 2016-04-06 NOTE — Telephone Encounter (Signed)
  Dr Jannifer Franklin- please advise. You last saw patient on 02/05/16

## 2016-04-06 NOTE — Telephone Encounter (Signed)
I called and talked with the daughter. The patient is having worsening headaches, initially the headaches were coming from the left side of the neck up into the back of the head on the left. The headaches are now all the way around the head with a bandlike quality, the headache severity has worsened.  I'll set the patient up for sedimentation rate, MRI of the brain.

## 2016-04-06 NOTE — Telephone Encounter (Signed)
Patient's daughter is calling stating since the patient is having headaches can a CT scan or an MRI be ordered. Please call to discuss.

## 2016-04-14 ENCOUNTER — Other Ambulatory Visit: Payer: Self-pay | Admitting: Neurology

## 2016-04-28 ENCOUNTER — Telehealth: Payer: Self-pay | Admitting: *Deleted

## 2016-05-26 NOTE — Telephone Encounter (Signed)
Left a voicemail on her daughter phone informing her the order was sent to GI and I gave her the phone number [phone] to call and scheduled the MRI.

## 2016-06-01 ENCOUNTER — Other Ambulatory Visit
Admission: RE | Admit: 2016-06-01 | Discharge: 2016-06-01 | Disposition: A | Discharge status: Home / Self Care | Payer: Medicare Other | Source: Ambulatory Visit | Attending: Ophthalmology | Admitting: Ophthalmology

## 2016-06-01 DIAGNOSIS — G4452 New daily persistent headache (NDPH): Secondary | ICD-10-CM | POA: Diagnosis present

## 2016-06-01 LAB — CBC WITH DIFFERENTIAL/PLATELET
BASOS PCT: 1 %
Basophils Absolute: 0 10*3/uL (ref 0–0.1)
Eosinophils Absolute: 0.1 10*3/uL (ref 0–0.7)
Eosinophils Relative: 2 %
HEMATOCRIT: 31.6 % — AB (ref 35.0–47.0)
HEMOGLOBIN: 10.5 g/dL — AB (ref 12.0–16.0)
LYMPHS ABS: 0.7 10*3/uL — AB (ref 1.0–3.6)
LYMPHS PCT: 16 %
MCH: 30.4 pg (ref 26.0–34.0)
MCHC: 33.2 g/dL (ref 32.0–36.0)
MCV: 91.6 fL (ref 80.0–100.0)
MONO ABS: 0.3 10*3/uL (ref 0.2–0.9)
MONOS PCT: 7 %
NEUTROS ABS: 3.4 10*3/uL (ref 1.4–6.5)
NEUTROS PCT: 74 %
Platelets: 185 10*3/uL (ref 150–440)
RBC: 3.45 MIL/uL — ABNORMAL LOW (ref 3.80–5.20)
RDW: 15.8 % — AB (ref 11.5–14.5)
WBC: 4.5 10*3/uL (ref 3.6–11.0)

## 2016-06-01 LAB — C-REACTIVE PROTEIN: CRP: 1 mg/dL — AB (ref <1.0)

## 2016-06-01 LAB — SEDIMENTATION RATE: Sed Rate: 33 mm/hr — ABNORMAL HIGH (ref 0–30)

## 2016-06-06 ENCOUNTER — Other Ambulatory Visit: Payer: Medicare Other

## 2016-06-08 ENCOUNTER — Ambulatory Visit
Admission: RE | Admit: 2016-06-08 | Discharge: 2016-06-08 | Disposition: A | Discharge status: Home / Self Care | Payer: Medicare Other | Source: Ambulatory Visit | Attending: Neurology | Admitting: Neurology

## 2016-06-08 ENCOUNTER — Encounter: Payer: Self-pay | Admitting: Neurology

## 2016-06-08 ENCOUNTER — Ambulatory Visit (INDEPENDENT_AMBULATORY_CARE_PROVIDER_SITE_OTHER): Payer: Medicare Other | Admitting: Neurology

## 2016-06-08 ENCOUNTER — Other Ambulatory Visit: Payer: Self-pay

## 2016-06-08 ENCOUNTER — Telehealth: Payer: Self-pay | Admitting: Neurology

## 2016-06-08 VITALS — BP 105/59 | HR 73 | Ht 60.0 in | Wt 139.0 lb

## 2016-06-08 DIAGNOSIS — R269 Unspecified abnormalities of gait and mobility: Secondary | ICD-10-CM

## 2016-06-08 DIAGNOSIS — R51 Headache: Secondary | ICD-10-CM

## 2016-06-08 DIAGNOSIS — G4486 Cervicogenic headache: Secondary | ICD-10-CM

## 2016-06-08 DIAGNOSIS — G20 Parkinson's disease: Secondary | ICD-10-CM

## 2016-06-08 DIAGNOSIS — G44221 Chronic tension-type headache, intractable: Secondary | ICD-10-CM

## 2016-06-08 DIAGNOSIS — R413 Other amnesia: Secondary | ICD-10-CM | POA: Diagnosis not present

## 2016-06-08 MED ORDER — GABAPENTIN 100 MG PO CAPS: 100.0000 mg | ORAL_CAPSULE | Freq: Two times a day (BID) | ORAL | 3 refills | Status: DC

## 2016-06-08 MED ORDER — CARBIDOPA-LEVODOPA 25-250 MG PO TABS: ORAL_TABLET | ORAL | Status: DC

## 2016-06-08 MED ORDER — DONEPEZIL HCL 10 MG PO TABS: ORAL_TABLET | ORAL | 3 refills | Status: DC

## 2016-06-08 NOTE — Telephone Encounter (Signed)
  I called patient, talk with the family. MRI of the brain is stable from February 2017. Headaches are likely related to the neck, the patient has cervicogenic headache. The gabapentin dose has been increased, she will be getting into physical therapy.  MRI brain 06/08/16:  IMPRESSION:  This MRI of the brain without contrast shows the following: 1.     Chronic ischemic strokes in the right anterior limb of the internal capsule/caudate, left lentiform nucleus and left posterior frontal lobe.    The right internal capsule and caudate stroke was acute at the time of the 08/05/2015 MRI and has since evolved. The other small ischemic strokes appear unchanged 2.    Mild chronic microvascular ischemic changes elsewhere in the brain. 3.    There are no acute findings.

## 2016-06-08 NOTE — Patient Instructions (Signed)
With the Sinemet (carbidopa) 25/250 mg tablet, eliminate the 4 pm dose, call if she is not doing well following the change. We will go up on the gabapentin 100 mg tablet to one twice a day.   Neurontin (gabapentin) may result in drowsiness, ankle swelling, gait instability, or possibly dizziness. Please contact our office if significant side effects occur with this medication.

## 2016-06-08 NOTE — Progress Notes (Signed)
90 day refills e-scribed per faxed request from pharmacy. 

## 2016-06-08 NOTE — Progress Notes (Signed)
Reason for visit: Parkinson's disease  Brianna Coleman is an 75 y.o. female  History of present illness:  Brianna Coleman is a 75 year old right-handed white female with a history of Parkinson's disease associated with a gait disorder and a mild memory disorder. The patient has developed dyskinesias on the Sinemet, the family indicates that this worsens later in the day. The patient has had 2 falls since last seen, one fall occurred when she slipped out from a rocking chair, she had another fall only 3-4 days ago around a door jam. The patient generally will use a cane for ambulation. She was not using the cane when she fell. The patient continues to have some memory issues, she denies any problems with hallucinations on the medication. She is complaining of headaches that are ongoing. Initially, the headaches began in the back of the head on the left, felt to be cervicogenic headaches. The headaches are now going all around the head with a squeezing sensation. The patient has soreness in the back of the head where the cervical paraspinal muscles connect to the skull. The patient was placed on low-dose gabapentin taking 100 mg in the evening hours, this has not been effective in controlling her headaches. She recently did have a sedimentation rate and a C-reactive protein that were only minimally outside the normal range. MRI of the brain has been set up, this will be done today.  Past Medical History:  Diagnosis Date  . Anemia   . Aortic stenosis    mild AS by 03/20/13 echo [facility])  . Arthritis, degenerative   . Blood in stool   . 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 Chronic renal insufficiency    CKD stage IV, sees Dr Holley Raring in [place]  . Dyslipidemia   . Dysphagia   . Episode of syncope    Near-syncope  . Fall   . Gait disorder   . Gastroesophageal reflux disease   . Heart murmur   . History of blood transfusion   . Hypertension   . Memory  disturbance   . Neurogenic bladder   . Parkinson disease ([place])   . 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 RLS (restless legs syndrome)   . Shortness of breath    with exertion  . Stroke [facility])    no residual  . 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 CAROTID ENDARTERECTOMY    . CATARACT EXTRACTION, BILATERAL    . COLONOSCOPY    . ENDARTERECTOMY Right 09/19/2013   Procedure: ENDARTERECTOMY CAROTID WITH PATCH ANGIOPLASTY;  Surgeon: Elam Dutch, MD;  Location: [place];  Service: Vascular;  Laterality: Right;  . EYE SURGERY Bilateral    cataract  . UPPER GI ENDOSCOPY    . vaginal cyst removal  2009    Family History  Problem Relation Age of Onset  . Cancer Mother   . Hypertension Mother   . Heart disease Mother   . Hyperlipidemia Mother   . Heart attack Father   . Hypertension Father   . Diabetes Brother   . Diabetes Brother   . Heart disease Brother   . Hyperlipidemia Brother   . Hypertension Brother   . Varicose Veins Brother   . Hypertension Sister     Social history:  reports that she has never smoked. She has never used smokeless tobacco. She reports that she does not drink  alcohol or use drugs.   No Known Allergies  Medications:  Prior to Admission medications   Medication Sig Start Date End Date Taking? Authorizing Provider  aspirin 81 MG tablet Take 81 mg by mouth daily.   Yes Historical Provider, MD  carbidopa-levodopa (SINEMET IR) 25-250 MG tablet One tablet in the morning and 1/2 tablet at noon, 4 PM, and at night 02/05/16  Yes Kathrynn Ducking, MD  cholecalciferol (VITAMIN D) 1000 UNITS tablet Take 1,000 Units by mouth daily.   Yes Historical Provider, MD  conjugated estrogens (PREMARIN) vaginal cream Place 1 Applicatorful vaginally at bedtime. 12/07/13  Yes Historical Provider, MD  donepezil (ARICEPT) 10 MG tablet TAKE 1 TABLET (10 MG TOTAL) BY MOUTH AT BEDTIME. 03/18/16  Yes  Kathrynn Ducking, MD  enalapril (VASOTEC) 20 MG tablet Take 20 mg by mouth daily.   Yes Historical Provider, MD  ferrous sulfate 325 (65 FE) MG tablet Take 325 mg by mouth daily with breakfast.   Yes Historical Provider, MD  gabapentin (NEURONTIN) 100 MG capsule TAKE 1 CAPSULE (100 MG TOTAL) BY MOUTH AT BEDTIME. 04/14/16  Yes Kathrynn Ducking, MD  HYDROcodone-acetaminophen (NORCO/VICODIN) 5-325 MG tablet Take 1 tablet by mouth every 6 (six) hours as needed. 02/05/16  Yes Kathrynn Ducking, MD  pramipexole (MIRAPEX) 1 MG tablet TAKE 1 TABLET BY MOUTH 3 TIMES A DAY 08/01/15  Yes Kathrynn Ducking, MD    ROS:  Out of a complete 14 system review of symptoms, the patient complains only of the following symptoms, and all other reviewed systems are negative.  Memory loss Headache  Height 5' (1.524 m), weight 139 lb (63 kg).  Physical Exam  General: The patient is alert and cooperative at the time of the examination.  Skin: 1-2+ edema was noted below the knees bilaterally.   Neurologic Exam  Mental status: The patient is alert and oriented x 2 at the time of the examination (not oriented to date). The Mini-Mental Status Examination done today shows a total score of 26/30.   Cranial nerves: Facial symmetry is present. Speech is normal, no aphasia or dysarthria is noted. Extraocular movements are full. Visual fields are full.  Motor: The patient has good strength in all 4 extremities.  Sensory examination: Soft touch sensation is symmetric on the face, arms, and legs.  Coordination: The patient has good finger-nose-finger and heel-to-shin bilaterally. Dyskinesias involving the head and neck, arms and legs are noted.  Gait and station: The patient walks with a cane, she has dystonia positioning of the left arm with walking. Gait is slightly imbalance, and gait was not attempted. Romberg is negative. No drift is seen.  Reflexes: Deep tendon reflexes are symmetric.   Assessment/Plan:  1.  Parkinson's disease  2. Gait disorder  3. Mild memory disorder  4. Cervicogenic headache  The gabapentin will be increased taking 100 mg twice daily, she will be set up for physical therapy to work on the neck and shoulders for the headache issue. The Sinemet will be reduced as this is resulting in dyskinesias that are worsening the neck and shoulder discomfort. This may be leading to some of the headache. The patient will eliminate the half dose of Sinemet at 4 PM. She will follow-up in about 4 months. The family will call if the patient is having worsening symptoms.  Jill Alexanders MD 06/08/2016 8:20 AM  [facility] [address] [place], [place] [address]  Phone [phone] Fax [phone]

## 2016-06-16 ENCOUNTER — Emergency Department: Payer: Medicare Other

## 2016-06-16 ENCOUNTER — Encounter: Payer: Self-pay | Admitting: *Deleted

## 2016-06-16 ENCOUNTER — Emergency Department
Admission: EM | Admit: 2016-06-16 | Discharge: 2016-06-16 | Disposition: A | Discharge status: Home / Self Care | Payer: Medicare Other | Attending: Emergency Medicine | Admitting: Emergency Medicine

## 2016-06-16 DIAGNOSIS — Y929 Unspecified place or not applicable: Secondary | ICD-10-CM | POA: Diagnosis not present

## 2016-06-16 DIAGNOSIS — S0083XA Contusion of other part of head, initial encounter: Secondary | ICD-10-CM | POA: Insufficient documentation

## 2016-06-16 DIAGNOSIS — Z79899 Other long term (current) drug therapy: Secondary | ICD-10-CM | POA: Diagnosis not present

## 2016-06-16 DIAGNOSIS — S7012XA Contusion of left thigh, initial encounter: Secondary | ICD-10-CM | POA: Diagnosis not present

## 2016-06-16 DIAGNOSIS — Y9301 Activity, walking, marching and hiking: Secondary | ICD-10-CM | POA: Insufficient documentation

## 2016-06-16 DIAGNOSIS — N184 Chronic kidney disease, stage 4 (severe): Secondary | ICD-10-CM | POA: Diagnosis not present

## 2016-06-16 DIAGNOSIS — S7002XA Contusion of left hip, initial encounter: Secondary | ICD-10-CM | POA: Diagnosis not present

## 2016-06-16 DIAGNOSIS — W01198A Fall on same level from slipping, tripping and stumbling with subsequent striking against other object, initial encounter: Secondary | ICD-10-CM | POA: Diagnosis not present

## 2016-06-16 DIAGNOSIS — I129 Hypertensive chronic kidney disease with stage 1 through stage 4 chronic kidney disease, or unspecified chronic kidney disease: Secondary | ICD-10-CM | POA: Insufficient documentation

## 2016-06-16 DIAGNOSIS — S0990XA Unspecified injury of head, initial encounter: Secondary | ICD-10-CM | POA: Diagnosis present

## 2016-06-16 DIAGNOSIS — Z7982 Long term (current) use of aspirin: Secondary | ICD-10-CM | POA: Insufficient documentation

## 2016-06-16 DIAGNOSIS — Y92009 Unspecified place in unspecified non-institutional (private) residence as the place of occurrence of the external cause: Secondary | ICD-10-CM

## 2016-06-16 DIAGNOSIS — Y999 Unspecified external cause status: Secondary | ICD-10-CM | POA: Insufficient documentation

## 2016-06-16 DIAGNOSIS — W19XXXA Unspecified fall, initial encounter: Secondary | ICD-10-CM

## 2016-06-16 MED ORDER — TRAMADOL HCL 50 MG PO TABS
50.0000 mg | ORAL_TABLET | Freq: Once | ORAL | Status: AC
  Administered 2016-06-16: 50 mg via ORAL
  Filled 2016-06-16: qty 1

## 2016-06-16 MED ORDER — TRAMADOL HCL 50 MG PO TABS: 50.0000 mg | ORAL_TABLET | Freq: Four times a day (QID) | ORAL | 0 refills | Status: DC | PRN

## 2016-06-16 NOTE — ED Notes (Signed)
Pt tripped and fell, pt has bruises to left eye, pt denies LOC, pt complains of left groin pain

## 2016-06-16 NOTE — ED Triage Notes (Addendum)
Pt slipped wearing slippery socks this AM and hit hard wood floor, denies any LOC, arrives with bruising to left eye, denies any use of blood thinners, pt has hx of parkinsons, denies any blurry vision

## 2016-06-16 NOTE — ED Provider Notes (Signed)
[facility] Emergency Department Provider Note   ____________________________________________   First MD Initiated Contact with Patient 06/16/16 1200     (approximate)  I have reviewed the triage vital signs and the nursing notes.   HISTORY  Chief Complaint Fall and Head Injury    HPI Issabela Archacki Coleman is a 75 y.o. female patient was walking on a wooden floor wearing socks slipped and fell on her left side. Patient complaining of facial pain, left hip and pelvic pain. Patient denies loss of consciousness. Patient denies vertigo or vision disturbance. Patient states does not take any blood thinners. Patient rates pain as a 5/10 with weightbearing on the left hip. Patient describes the pain as "achy". No palliative measures taken for these complaints.  Past Medical History:  Diagnosis Date  . Anemia   . Aortic stenosis    mild AS by 03/20/13 echo [facility])  . Arthritis, degenerative   . Blood in stool   . 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 Chronic renal insufficiency    CKD stage IV, sees Dr Holley Raring in [place]  . Dyslipidemia   . Dysphagia   . Episode of syncope    Near-syncope  . Fall   . Gait disorder   . Gastroesophageal reflux disease   . Heart murmur   . History of blood transfusion   . Hypertension   . Memory disturbance   . Neurogenic bladder   . Parkinson disease ([place])   . 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 RLS (restless legs syndrome)   . Shortness of breath    with exertion  . Stroke [facility])    no residual  . 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 Cervicogenic headache 02/05/2016  . 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 Cerebrovascular accident (CVA) ([place]) 08/12/2015  . Transient alteration of awareness 07/31/2015  . 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 Absolute anemia  01/26/2014  . Aortic valve defect 01/26/2014  . Congestive heart failure ([place]) 01/26/2014  . Benign essential HTN 01/26/2014  . HLD (hyperlipidemia) 01/26/2014  . Pure hypercholesterolemia 01/26/2014  . Avitaminosis D 01/26/2014  . Carotid artery stenosis 09/19/2013  . Carotid artery obstruction 09/19/2013  . Occlusion and stenosis of carotid artery without mention of cerebral infarction 09/14/2013  . Paralysis agitans ([place]) 10/04/2012  . Abnormality of gait 10/04/2012  . Parkinson's disease ([place]) 10/04/2012  . Memory loss 06/21/2012  . Dysphagia, unspecified(787.20) 06/21/2012  . Unspecified essential hypertension 06/21/2012  . Other and unspecified hyperlipidemia 06/21/2012  . Unspecified cataract 06/21/2012  . Cataract 06/21/2012  . Can't get food down 06/21/2012  . Essential (primary) hypertension 06/21/2012    Past Surgical History:  Procedure Laterality Date  . CAROTID ENDARTERECTOMY    . CATARACT EXTRACTION, BILATERAL    . COLONOSCOPY    . ENDARTERECTOMY Right 09/19/2013   Procedure: ENDARTERECTOMY CAROTID WITH PATCH ANGIOPLASTY;  Surgeon: Elam Dutch, MD;  Location: [place];  Service: Vascular;  Laterality: Right;  . EYE SURGERY Bilateral    cataract  . UPPER GI ENDOSCOPY    . vaginal cyst removal  2009    Prior to Admission medications   Medication Sig Start Date End Date Taking? Authorizing Provider  aspirin 81 MG tablet Take 81 mg by mouth daily.    Historical Provider, MD  carbidopa-levodopa (SINEMET IR) 25-250  MG tablet One tablet in the morning and 1/2 tablet at noon, and at night 06/08/16   Kathrynn Ducking, MD  cholecalciferol (VITAMIN D) 1000 UNITS tablet Take 1,000 Units by mouth daily.    Historical Provider, MD  conjugated estrogens (PREMARIN) vaginal cream Place 1 Applicatorful vaginally at bedtime. 12/07/13   Historical Provider, MD  donepezil (ARICEPT) 10 MG tablet TAKE 1 TABLET (10 MG TOTAL) BY MOUTH AT BEDTIME. 06/08/16   Kathrynn Ducking, MD    enalapril (VASOTEC) 20 MG tablet Take 20 mg by mouth daily.    Historical Provider, MD  ferrous sulfate 325 (65 FE) MG tablet Take 325 mg by mouth daily with breakfast.    Historical Provider, MD  gabapentin (NEURONTIN) 100 MG capsule Take 1 capsule (100 mg total) by mouth 2 (two) times daily. 06/08/16   Kathrynn Ducking, MD  HYDROcodone-acetaminophen (NORCO/VICODIN) 5-325 MG tablet Take 1 tablet by mouth every 6 (six) hours as needed. 02/05/16   Kathrynn Ducking, MD  pramipexole (MIRAPEX) 1 MG tablet TAKE 1 TABLET BY MOUTH 3 TIMES A DAY 08/01/15   Kathrynn Ducking, MD  traMADol (ULTRAM) 50 MG tablet Take 1 tablet (50 mg total) by mouth every 6 (six) hours as needed for moderate pain. 06/16/16   Sable Feil, PA-C    Allergies Patient has no known allergies.  Family History  Problem Relation Age of Onset  . Cancer Mother   . Hypertension Mother   . Heart disease Mother   . Hyperlipidemia Mother   . Heart attack Father   . Hypertension Father   . Diabetes Brother   . Diabetes Brother   . Heart disease Brother   . Hyperlipidemia Brother   . Hypertension Brother   . Varicose Veins Brother   . Hypertension Sister     Social History Social History  Substance Use Topics  . Smoking status: Never Smoker  . Smokeless tobacco: Never Used  . Alcohol use No    Review of Systems Constitutional: No fever/chills Eyes: No visual changes. ENT: No sore throat. Cardiovascular: Denies chest pain. Respiratory: Denies shortness of breath. Gastrointestinal: No abdominal pain.  No nausea, no vomiting.  No diarrhea.  No constipation. Genitourinary: Negative for dysuria. Musculoskeletal: Positive for left hip pain. Skin: Left facial bruising Neurological: Negative for headaches, focal weakness or numbness. Endocrine:Hypertension hyperlipidemia   ____________________________________________   PHYSICAL EXAM:  VITAL SIGNS: ED Triage Vitals   Enc Vitals Group     BP (!) 110/58      Pulse Rate 77     Resp 18     Temp 97.7 F (36.5 C)     Temp Source Oral     SpO2 98 %     Weight 139 lb (63 kg)     Height 5' (1.524 m)     Head Circumference      Peak Flow      Pain Score 0     Pain Loc      Pain Edu?      Excl. in [place]?     Constitutional: Alert and oriented. Well appearing and in no acute distress. Eyes: Conjunctivae are normal. PERRL. EOMI. Head: Atraumatic. Nose: No congestion/rhinnorhea. Mouth/Throat: Mucous membranes are moist.  Oropharynx non-erythematous. Neck: No stridor.   Hematological/Lymphatic/Immunilogical: No cervical lymphadenopathy. Cardiovascular: Normal rate, regular rhythm. Grossly normal heart sounds.  Good peripheral circulation. Respiratory: Normal respiratory effort.  No retractions. Lungs CTAB. Gastrointestinal: Soft and nontender. No distention. No abdominal bruits.  No CVA tenderness. Musculoskeletal:Guarding with palpation of the posterior hip. No ecchymosis noticed. Patient also has some mild guarding palpation greater trochanter of the left hip.  Neurologic:  Normal speech and language. No gross focal neurologic deficits are appreciated. No gait instability. Skin:  Skin is warm, dry and intact. No rash noted. Facial ecchymosis confined mostly to the left side Psychiatric: Mood and affect are normal. Speech and behavior are normal.  ____________________________________________   LABS (all labs ordered are listed, but only abnormal results are displayed)  Labs Reviewed - No data to display ____________________________________________  EKG   ____________________________________________  RADIOLOGY  Facial bones and left hip x-rays reveal no acute findings. ____________________________________________   PROCEDURES  Procedure(s) performed: None  Procedures  Critical Care performed: No  ____________________________________________   INITIAL IMPRESSION / ASSESSMENT AND PLAN / ED COURSE  Pertinent labs & imaging  results that were available during my care of the patient were reviewed by me and considered in my medical decision making (see chart for details).  Facial and left hip contusion secondary to fall. Patient given discharge care instructions. Patient given prescription for tramadol to take every 12 hours when necessary for pain. Patient advised follow-up family doctor in 2-3 days if condition persists.  Clinical Course   Discussed negative x-ray finding with patient and family.   ____________________________________________   FINAL CLINICAL IMPRESSION(S) / ED DIAGNOSES  Final diagnoses:  Fall in home, initial encounter  Facial contusion, initial encounter  Contusion of hip and thigh, left, initial encounter      NEW MEDICATIONS STARTED DURING THIS VISIT:  New Prescriptions   TRAMADOL (ULTRAM) 50 MG TABLET    Take 1 tablet (50 mg total) by mouth every 6 (six) hours as needed for moderate pain.     Note:  This document was prepared using Dragon voice recognition software and may include unintentional dictation errors.    Sable Feil, PA-C 06/16/16 1321    Lisa Roca, MD 06/16/16 [id number]

## 2016-06-28 ENCOUNTER — Other Ambulatory Visit: Payer: Self-pay | Admitting: Neurology

## 2016-07-06 ENCOUNTER — Other Ambulatory Visit: Payer: Self-pay | Admitting: Neurology

## 2016-07-06 MED ORDER — HYDROCODONE-ACETAMINOPHEN 5-325 MG PO TABS: 1.0000 | ORAL_TABLET | Freq: Four times a day (QID) | ORAL | 0 refills | Status: DC | PRN

## 2016-07-06 NOTE — Telephone Encounter (Signed)
Pt's daughter request refill for HYDROcodone-acetaminophen (NORCO/VICODIN) 5-325 MG tablet . Pt is being admitted to [facility] and they need hard copy.

## 2016-07-06 NOTE — Telephone Encounter (Signed)
Rx printed, signed, up front for pick-up. Dtr notified via TC.

## 2016-07-06 NOTE — Telephone Encounter (Signed)
Patient was seen for OV and also by ED in Dec. Has follow-up scheduled in May. Last rx written 02/05/16.

## 2016-07-26 ENCOUNTER — Other Ambulatory Visit: Payer: Self-pay | Admitting: Neurology

## 2016-08-11 ENCOUNTER — Other Ambulatory Visit: Payer: Self-pay | Admitting: *Deleted

## 2016-08-11 MED ORDER — GABAPENTIN 100 MG PO CAPS: 100.0000 mg | ORAL_CAPSULE | Freq: Two times a day (BID) | ORAL | 4 refills | Status: DC

## 2016-08-17 ENCOUNTER — Other Ambulatory Visit: Payer: Self-pay | Admitting: Neurology

## 2016-11-09 ENCOUNTER — Ambulatory Visit: Payer: Medicare Other | Admitting: Neurology

## 2016-12-18 ENCOUNTER — Emergency Department: Payer: Medicare Other

## 2016-12-18 ENCOUNTER — Encounter: Payer: Self-pay | Admitting: Emergency Medicine

## 2016-12-18 ENCOUNTER — Emergency Department
Admission: EM | Admit: 2016-12-18 | Discharge: 2016-12-18 | Disposition: A | Discharge status: Home / Self Care | Payer: Medicare Other | Attending: Emergency Medicine | Admitting: Emergency Medicine

## 2016-12-18 DIAGNOSIS — N184 Chronic kidney disease, stage 4 (severe): Secondary | ICD-10-CM | POA: Insufficient documentation

## 2016-12-18 DIAGNOSIS — Z79899 Other long term (current) drug therapy: Secondary | ICD-10-CM | POA: Insufficient documentation

## 2016-12-18 DIAGNOSIS — K449 Diaphragmatic hernia without obstruction or gangrene: Secondary | ICD-10-CM | POA: Diagnosis not present

## 2016-12-18 DIAGNOSIS — R1110 Vomiting, unspecified: Secondary | ICD-10-CM | POA: Diagnosis present

## 2016-12-18 DIAGNOSIS — Z7982 Long term (current) use of aspirin: Secondary | ICD-10-CM | POA: Insufficient documentation

## 2016-12-18 DIAGNOSIS — I130 Hypertensive heart and chronic kidney disease with heart failure and stage 1 through stage 4 chronic kidney disease, or unspecified chronic kidney disease: Secondary | ICD-10-CM | POA: Insufficient documentation

## 2016-12-18 DIAGNOSIS — I509 Heart failure, unspecified: Secondary | ICD-10-CM | POA: Insufficient documentation

## 2016-12-18 DIAGNOSIS — G43A0 Cyclical vomiting, not intractable: Secondary | ICD-10-CM | POA: Insufficient documentation

## 2016-12-18 DIAGNOSIS — R112 Nausea with vomiting, unspecified: Secondary | ICD-10-CM

## 2016-12-18 LAB — CBC WITH DIFFERENTIAL/PLATELET
BASOS ABS: 0 10*3/uL (ref 0–0.1)
BASOS PCT: 0 %
EOS ABS: 0 10*3/uL (ref 0–0.7)
Eosinophils Relative: 0 %
HCT: 34.3 % — ABNORMAL LOW (ref 35.0–47.0)
Hemoglobin: 11.4 g/dL — ABNORMAL LOW (ref 12.0–16.0)
Lymphocytes Relative: 4 %
Lymphs Abs: 0.3 10*3/uL — ABNORMAL LOW (ref 1.0–3.6)
MCH: 30.2 pg (ref 26.0–34.0)
MCHC: 33.3 g/dL (ref 32.0–36.0)
MCV: 90.9 fL (ref 80.0–100.0)
MONO ABS: 0.2 10*3/uL (ref 0.2–0.9)
MONOS PCT: 2 %
Neutro Abs: 8.3 10*3/uL — ABNORMAL HIGH (ref 1.4–6.5)
Neutrophils Relative %: 94 %
PLATELETS: 184 10*3/uL (ref 150–440)
RBC: 3.77 MIL/uL — ABNORMAL LOW (ref 3.80–5.20)
RDW: 15.9 % — AB (ref 11.5–14.5)
WBC: 8.8 10*3/uL (ref 3.6–11.0)

## 2016-12-18 LAB — TROPONIN I: Troponin I: <0.03 ng/mL (ref <0.03)

## 2016-12-18 LAB — COMPREHENSIVE METABOLIC PANEL
ALBUMIN: 3.9 g/dL (ref 3.5–5.0)
ALT: 8 U/L — ABNORMAL LOW (ref 14–54)
ANION GAP: 12 (ref 5–15)
AST: 26 U/L (ref 15–41)
Alkaline Phosphatase: 83 U/L (ref 38–126)
BUN: 53 mg/dL — ABNORMAL HIGH (ref 6–20)
CHLORIDE: 110 mmol/L (ref 101–111)
CO2: 22 mmol/L (ref 22–32)
Calcium: 9.6 mg/dL (ref 8.9–10.3)
Creatinine, Ser: 2.45 mg/dL — ABNORMAL HIGH (ref 0.44–1.00)
GFR calc Af Amer: 21 mL/min — ABNORMAL LOW (ref >60)
GFR calc non Af Amer: 18 mL/min — ABNORMAL LOW (ref >60)
GLUCOSE: 176 mg/dL — AB (ref 65–99)
POTASSIUM: 4.4 mmol/L (ref 3.5–5.1)
Sodium: 144 mmol/L (ref 135–145)
TOTAL PROTEIN: 6.9 g/dL (ref 6.5–8.1)
Total Bilirubin: 0.7 mg/dL (ref 0.3–1.2)

## 2016-12-18 LAB — URINALYSIS, COMPLETE (UACMP) WITH MICROSCOPIC
BACTERIA UA: NONE SEEN
BILIRUBIN URINE: NEGATIVE
Glucose, UA: NEGATIVE mg/dL
KETONES UR: NEGATIVE mg/dL
LEUKOCYTES UA: NEGATIVE
NITRITE: NEGATIVE
PH: 6 (ref 5.0–8.0)
PROTEIN: 100 mg/dL — AB
Specific Gravity, Urine: 1.012 (ref 1.005–1.030)

## 2016-12-18 LAB — LIPASE, BLOOD: Lipase: 61 U/L — ABNORMAL HIGH (ref 11–51)

## 2016-12-18 MED ORDER — ONDANSETRON HCL 4 MG/2ML IJ SOLN
4.0000 mg | Freq: Once | INTRAMUSCULAR | Status: AC
  Administered 2016-12-18: 4 mg via INTRAVENOUS
  Filled 2016-12-18: qty 2

## 2016-12-18 MED ORDER — SODIUM CHLORIDE 0.9 % IV BOLUS (SEPSIS)
1000.0000 mL | Freq: Once | INTRAVENOUS | Status: AC
  Administered 2016-12-18: 1000 mL via INTRAVENOUS

## 2016-12-18 MED ORDER — ONDANSETRON 4 MG PO TBDP: 4.0000 mg | ORAL_TABLET | Freq: Three times a day (TID) | ORAL | 0 refills | Status: DC | PRN

## 2016-12-18 NOTE — ED Notes (Signed)
Patient transported to Ultrasound 

## 2016-12-18 NOTE — ED Triage Notes (Signed)
Pt to ED via POV c/o Nausea and vomiting since last night. Pt states that every time she would sit up she would vomit. Pt daughter states that pt was recently treated for UTI, but has still been having issues holding her bladder. Pt does not appear to be in any distress at this time.

## 2016-12-18 NOTE — ED Provider Notes (Signed)
[facility] Emergency Department Provider Note  ____________________________________________  Time seen: Approximately 8:16 AM  I have reviewed the triage vital signs and the nursing notes.   HISTORY  Chief Complaint Emesis   HPI Brianna Coleman is a 76 y.o. female with a history of Parkinson's disease, chronic kidney disease, aortic stenosis, hypertension, reflux, CVA who presents for evaluation of nausea and vomiting. According to patient's daughter patient was doing well yesterday evening when she went to work. This morning when daughter came home patient was covered in vomit and told her that she had been vomiting all night long. She has had several episodes of nonbloody nonbillious emesis. Patient denies diarrhea, fever or chills. She does endorse mild dull suprapubic abdominal pain that started this morning, non radiating. She was recently treated for UTI finishing her last antibiotic yesterday evening. She denies dysuria or hematuria. She denies constipation. She denies chest pain or shortness of breath or cough. She denies any prior history of abdominal surgeries.No HA, or unilateral weakness or numbness.  Past Medical History:  Diagnosis Date  . Anemia   . Aortic stenosis    mild AS by 03/20/13 echo [facility])  . Arthritis, degenerative   . Blood in stool   . 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 Chronic renal insufficiency    CKD stage IV, sees Dr Holley Raring in [place]  . Dyslipidemia   . Dysphagia   . Episode of syncope    Near-syncope  . Fall   . Gait disorder   . Gastroesophageal reflux disease   . Heart murmur   . History of blood transfusion   . Hypertension   . Memory disturbance   . Neurogenic bladder   . Parkinson disease ([place])   . 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 RLS (restless legs syndrome)   . Shortness of breath    with exertion  . Stroke [facility])    no residual  . 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 Cervicogenic headache 02/05/2016  . Stroke due to embolism of right carotid artery ([place]) 09/06/2015  . Cerebrovascular accident (CVA) ([place]) 08/12/2015  . Transient alteration of awareness 07/31/2015  . 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 Absolute anemia 01/26/2014  . Aortic valve defect 01/26/2014  . Congestive heart failure ([place]) 01/26/2014  . Benign essential HTN 01/26/2014  . HLD (hyperlipidemia) 01/26/2014  . Pure hypercholesterolemia 01/26/2014  . Avitaminosis D 01/26/2014  . Carotid artery stenosis 09/19/2013  . Carotid artery obstruction 09/19/2013  . Occlusion and stenosis of carotid artery without mention of cerebral infarction 09/14/2013  . Paralysis agitans ([place]) 10/04/2012  . Abnormality of gait 10/04/2012  . Parkinson's disease ([place]) 10/04/2012  . Memory loss 06/21/2012  . Dysphagia, unspecified(787.20) 06/21/2012  . Unspecified essential hypertension 06/21/2012  . Other and unspecified hyperlipidemia 06/21/2012  . Unspecified cataract 06/21/2012  . Cataract 06/21/2012  . Can't get food down 06/21/2012  . Essential (primary) hypertension 06/21/2012    Past Surgical History:  Procedure Laterality Date  . CAROTID ENDARTERECTOMY    . CATARACT EXTRACTION, BILATERAL    . COLONOSCOPY    . ENDARTERECTOMY Right 09/19/2013   Procedure: ENDARTERECTOMY CAROTID WITH PATCH ANGIOPLASTY;  Surgeon: Elam Dutch, MD;  Location: [place];  Service: Vascular;  Laterality: Right;  . EYE SURGERY Bilateral    cataract  . UPPER GI ENDOSCOPY    .  vaginal cyst removal  2009    Prior to Admission medications   Medication Sig Start Date End Date Taking? Authorizing Provider  aspirin EC 81 MG tablet Take 81 mg by mouth daily.   Yes [provider]  carbidopa-levodopa (SINEMET IR) 25-250 MG tablet Take 1 tab in morning, 0.5 tab at noon, 1 tab in evening. Patient taking  differently: Take 0.5-1 tablets by mouth 3 (three) times daily. 0.5 tablet every morning, 1 tablet at noon, and 1 tablet every evening 08/17/16  Yes Kathrynn Ducking, MD  cholecalciferol (VITAMIN D) 1000 UNITS tablet Take 1,000 Units by mouth daily.   Yes [provider]  conjugated estrogens (PREMARIN) vaginal cream Place 1 Applicatorful vaginally daily as needed. For inflammation   Yes [provider]  donepezil (ARICEPT) 5 MG tablet Take 5 mg by mouth at bedtime.   Yes [provider]  ferrous gluconate (FERGON) 324 MG tablet Take 324 mg by mouth daily with breakfast.   Yes [provider]  gabapentin (NEURONTIN) 100 MG capsule Take 1 capsule (100 mg total) by mouth 2 (two) times daily. 08/11/16  Yes Kathrynn Ducking, MD  HYDROcodone-acetaminophen (NORCO/VICODIN) 5-325 MG tablet Take 1 tablet by mouth every 6 (six) hours as needed. Patient taking differently: Take 1 tablet by mouth every 6 (six) hours as needed for moderate pain.  07/06/16  Yes Kathrynn Ducking, MD  loratadine (CLARITIN) 10 MG tablet Take 10 mg by mouth daily as needed for allergies or rhinitis.   Yes [provider]  NIFEdipine (PROCARDIA-XL/ADALAT-CC/NIFEDICAL-XL) 30 MG 24 hr tablet Take 30 mg by mouth daily.   Yes [provider]  pramipexole (MIRAPEX) 1 MG tablet Take 1 mg by mouth 3 (three) times daily.   Yes [provider]  ondansetron (ZOFRAN ODT) 4 MG disintegrating tablet Take 1 tablet (4 mg total) by mouth every 8 (eight) hours as needed for nausea or vomiting. 12/18/16   Rudene Re, MD    Allergies Patient has no known allergies.  Family History  Problem Relation Age of Onset  . Cancer Mother   . Hypertension Mother   . Heart disease Mother   . Hyperlipidemia Mother   . Heart attack Father   . Hypertension Father   . Diabetes Brother   . Diabetes Brother   . Heart disease Brother   . Hyperlipidemia Brother   . Hypertension Brother   .  Varicose Veins Brother   . Hypertension Sister     Social History Social History  Substance Use Topics  . Smoking status: Never Smoker  . Smokeless tobacco: Never Used  . Alcohol use No    Review of Systems  Constitutional: Negative for fever. Eyes: Negative for visual changes. ENT: Negative for sore throat. Neck: No neck pain  Cardiovascular: Negative for chest pain. Respiratory: Negative for shortness of breath. Gastrointestinal: + suprapubic abdominal pain, nausea, and vomiting. No diarrhea. Genitourinary: Negative for dysuria. Musculoskeletal: Negative for back pain. Skin: Negative for rash. Neurological: Negative for headaches, weakness or numbness. Psych: No SI or HI  ____________________________________________   PHYSICAL EXAM:  VITAL SIGNS: ED Triage Vitals  Enc Vitals Group     BP 12/18/16 0801 (!) 147/59     Pulse Rate 12/18/16 0801 (!) 105     Resp 12/18/16 0801 16     Temp 12/18/16 0801 98.1 F (36.7 C)     Temp Source 12/18/16 0801 Oral     SpO2 12/18/16 0801 96 %  Weight 12/18/16 0759 140 lb (63.5 kg)     Height --      Head Circumference --      Peak Flow --      Pain Score --      Pain Loc --      Pain Edu? --      Excl. in [place]? --     Constitutional: Alert and oriented. Well appearing and in no apparent distress. HEENT:      Head: Normocephalic and atraumatic.         Eyes: Conjunctivae are normal. Sclera is non-icteric.       Mouth/Throat: Mucous membranes are moist.       Neck: Supple with no signs of meningismus. Cardiovascular: Tachycardic with regular rhythm. Loud AS murmur Respiratory: Increased WOB, normal sats. Lungs are clear to auscultation bilaterally. No wheezes, crackles, or rhonchi.  Gastrointestinal: Soft, mild suprapubic ttp, non distended with positive bowel sounds. No rebound or guarding. Genitourinary: No CVA tenderness. Musculoskeletal: Nontender with normal range of motion in all extremities. No edema, cyanosis, or  erythema of extremities. Neurologic: Normal speech and language. Face is symmetric. Moving all extremities. No pronator drift. No gross focal neurologic deficits are appreciated. Skin: Skin is warm, dry and intact. No rash noted. Psychiatric: Mood and affect are normal. Speech and behavior are normal.  ____________________________________________   LABS (all labs ordered are listed, but only abnormal results are displayed)  Labs Reviewed  CBC WITH DIFFERENTIAL/PLATELET - Abnormal; Notable for the following:       Result Value   RBC 3.77 (*)    Hemoglobin 11.4 (*)    HCT 34.3 (*)    RDW 15.9 (*)    Neutro Abs 8.3 (*)    Lymphs Abs 0.3 (*)    All other components within normal limits  COMPREHENSIVE METABOLIC PANEL - Abnormal; Notable for the following:    Glucose, Bld 176 (*)    BUN 53 (*)    Creatinine, Ser 2.45 (*)    ALT 8 (*)    GFR calc non Af Amer 18 (*)    GFR calc Af Amer 21 (*)    All other components within normal limits  LIPASE, BLOOD - Abnormal; Notable for the following:    Lipase 61 (*)    All other components within normal limits  URINALYSIS, COMPLETE (UACMP) WITH MICROSCOPIC - Abnormal; Notable for the following:    Color, Urine STRAW (*)    APPearance CLEAR (*)    Hgb urine dipstick SMALL (*)    Protein, ur 100 (*)    Squamous Epithelial / LPF 0-5 (*)    All other components within normal limits  TROPONIN I   ____________________________________________  EKG  ED ECG REPORT I, Rudene Re, the attending physician, personally viewed and interpreted this ECG.  Sinus tachycardia, rate of 104, right bundle branch block, normal QTC, right axis deviation, no ST elevations or depressions. RBBB is old when compared to prior from 05/2016 ____________________________________________  RADIOLOGY  CXR: Large hiatal hernia. No acute findings.  CT a/p: 1. Large hiatal hernia as has been previously described, without acute change. 2. Possible chronic  calculus cholecystitis. 3. Descending and sigmoid diverticulosis. 4. Stable liver masses presumably benign lesions such as Hemangiomas.  RUQ [place]: 1. No acute abnormality. 2. 3.6 x 3.5 x 1.9 cm right lobe liver probable hemangioma, stable by CT since 2013. 3. 2 left lobe liver masses that do not have typical ultrasound features of hemangiomas and are  also stable by CT since 05/04/2012. These could represent atypical hemangiomas or other benign liver masses. ____________________________________________   PROCEDURES  Procedure(s) performed: None Procedures Critical Care performed:  None ____________________________________________   INITIAL IMPRESSION / ASSESSMENT AND PLAN / ED COURSE  76 y.o. female with a history of Parkinson's disease, chronic kidney disease, aortic stenosis, hypertension, reflux, CVA who presents for evaluation of nausea and vomiting. Patient is in no distress at this time however the she has increased work of breathing with clear lungs. She has normal sats and she is afebrile. We'll send her for chest x-ray to rule out pneumonia. She has mild suprapubic tenderness we'll check a UA and basic blood work. Will perform serial abdominal exams to eval for need of imaging. Will give zofran and IVF. She is neurologically intact with no headache and no suspicion for stroke.    _________________________ 11:25 AM on 12/18/2016 -----------------------------------------  Patient is markedly improved after IV fluids. Tolerating by mouth. No longer vomiting the emergency room. Her increased work of breathing resolved with IV hydration. Initial chest x-ray concerning for hiatal hernia only. Patient underwent CT abdomen and pelvis that was concerning for possible chronic calculus cholecystitis. Right upper quadrant ultrasound rule out gallstones or cholecystitis. UA with no evidence of urinary tract infection. Remaining of patient's labs with no changes from baseline. Patient is  currently discharged home with a prescription for Zofran and follow-up with primary care doctor for further management of her hiatal hernia.  Pertinent labs & imaging results that were available during my care of the patient were reviewed by me and considered in my medical decision making (see chart for details).    ____________________________________________   FINAL CLINICAL IMPRESSION(S) / ED DIAGNOSES  Final diagnoses:  Nausea and vomiting  Non-intractable vomiting with nausea, unspecified vomiting type  Hiatal hernia      NEW MEDICATIONS STARTED DURING THIS VISIT:  New Prescriptions   ONDANSETRON (ZOFRAN ODT) 4 MG DISINTEGRATING TABLET    Take 1 tablet (4 mg total) by mouth every 8 (eight) hours as needed for nausea or vomiting.     Note:  This document was prepared using Dragon voice recognition software and may include unintentional dictation errors.    Alfred Levins, [place], MD 12/18/16 1126

## 2016-12-21 ENCOUNTER — Ambulatory Visit (INDEPENDENT_AMBULATORY_CARE_PROVIDER_SITE_OTHER): Payer: Medicare Other | Admitting: Neurology

## 2016-12-21 ENCOUNTER — Encounter: Payer: Self-pay | Admitting: Neurology

## 2016-12-21 VITALS — BP 112/60 | HR 83 | Ht 60.0 in | Wt 141.5 lb

## 2016-12-21 DIAGNOSIS — R413 Other amnesia: Secondary | ICD-10-CM

## 2016-12-21 DIAGNOSIS — R269 Unspecified abnormalities of gait and mobility: Secondary | ICD-10-CM | POA: Diagnosis not present

## 2016-12-21 DIAGNOSIS — R482 Apraxia: Secondary | ICD-10-CM | POA: Insufficient documentation

## 2016-12-21 DIAGNOSIS — G20 Parkinson's disease: Secondary | ICD-10-CM | POA: Diagnosis not present

## 2016-12-21 NOTE — Progress Notes (Signed)
Reason for visit: Parkinson's disease  Brianna Coleman is an 76 y.o. female  History of present illness:  Brianna Coleman is a 76 year old right handed white female with a history of Parkinson's disease and a memory and a gait disorder. She comes to the office today indicating that she has not had any more falls since December of 2017. She went to the emergency room on 06/16/2017 following a fall. The patient uses a cane for ambulation outside the house, she uses a walker inside the house. She is having frequent hallucinations at are not threatening, she has cognitive awareness that the hallucinations are not real. She may see animals running in the house or children about the house. The patient is having some increasing problems with reading, she reports some oscillopsia, and occasional double vision. The patient is able to see the television and she is able to read things on the TV. She has been seen by her optometrist. The patient has been placed on gabapentin which seemed to help the neck pain and headaches, the double vision and oscillopsia may have started around the time this medication was started. The patient is having increased dyskinesias in the afternoon time, she gets a full Sinemet tablet around noon. She returns to the office today for an evaluation. She continues to have some mild memory issues. This has not changed much since last seen.   Past Medical History:  Diagnosis Date  . Anemia   . Aortic stenosis    mild AS by 03/20/13 echo [facility])  . Arthritis, degenerative   . Blood in stool   . 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 Chronic renal insufficiency    CKD stage IV, sees Dr Holley Raring in [place]  . Dyslipidemia   . Dysphagia   . Episode of syncope    Near-syncope  . Fall   . Gait disorder   . Gastroesophageal reflux disease   . Heart murmur   . History of blood transfusion   . Hypertension   . Memory disturbance   . Neurogenic bladder    . Parkinson disease ([place])   . 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 RLS (restless legs syndrome)   . Shortness of breath    with exertion  . Stroke [facility])    no residual  . 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 CAROTID ENDARTERECTOMY    . CATARACT EXTRACTION, BILATERAL    . COLONOSCOPY    . ENDARTERECTOMY Right 09/19/2013   Procedure: ENDARTERECTOMY CAROTID WITH PATCH ANGIOPLASTY;  Surgeon: Elam Dutch, MD;  Location: [place];  Service: Vascular;  Laterality: Right;  . EYE SURGERY Bilateral    cataract  . UPPER GI ENDOSCOPY    . vaginal cyst removal  2009    Family History  Problem Relation Age of Onset  . Cancer Mother   . Hypertension Mother   . Heart disease Mother   . Hyperlipidemia Mother   . Heart attack Father   . Hypertension Father   . Diabetes Brother   . Diabetes Brother   . Heart disease Brother   . Hyperlipidemia Brother   . Hypertension Brother   . Varicose Veins Brother   . Hypertension Sister     Social history:  reports that she has never smoked. She has never used smokeless tobacco. She reports that she does not drink alcohol or  use drugs.   No Known Allergies  Medications:  Prior to Admission medications   Medication Sig Start Date End Date Taking? Authorizing Provider  aspirin EC 81 MG tablet Take 81 mg by mouth daily.   Yes [provider]  carbidopa-levodopa (SINEMET IR) 25-250 MG tablet Take 1 tab in morning, 0.5 tab at noon, 1 tab in evening. Patient taking differently: Take 0.5-1 tablets by mouth 3 (three) times daily. 0.5 tablet every morning, 1 tablet at noon, and 1 tablet every evening 08/17/16  Yes Kathrynn Ducking, MD  cholecalciferol (VITAMIN D) 1000 UNITS tablet Take 1,000 Units by mouth daily.   Yes [provider]  conjugated estrogens (PREMARIN) vaginal cream Place 1 Applicatorful vaginally daily as needed. For inflammation    Yes [provider]  donepezil (ARICEPT) 5 MG tablet Take 5 mg by mouth at bedtime.   Yes [provider]  ferrous gluconate (FERGON) 324 MG tablet Take 324 mg by mouth daily with breakfast.   Yes [provider]  gabapentin (NEURONTIN) 100 MG capsule Take 1 capsule (100 mg total) by mouth 2 (two) times daily. 08/11/16  Yes Kathrynn Ducking, MD  HYDROcodone-acetaminophen (NORCO/VICODIN) 5-325 MG tablet Take 1 tablet by mouth every 6 (six) hours as needed. Patient taking differently: Take 1 tablet by mouth every 6 (six) hours as needed for moderate pain.  07/06/16  Yes Kathrynn Ducking, MD  loratadine (CLARITIN) 10 MG tablet Take 10 mg by mouth daily as needed for allergies or rhinitis.   Yes [provider]  NIFEdipine (PROCARDIA-XL/ADALAT-CC/NIFEDICAL-XL) 30 MG 24 hr tablet Take 30 mg by mouth daily.   Yes [provider]  ondansetron (ZOFRAN ODT) 4 MG disintegrating tablet Take 1 tablet (4 mg total) by mouth every 8 (eight) hours as needed for nausea or vomiting. 12/18/16  Yes Alfred Levins, [place], MD  pramipexole (MIRAPEX) 1 MG tablet Take 1 mg by mouth 3 (three) times daily.   Yes [provider]    ROS:  Out of a complete 14 system review of symptoms, the patient complains only of the following symptoms, and all other reviewed systems are negative.  Difficulty swallowing, drooling Eye discharge, double vision, blurred vision Leg swelling Cold intolerance Abdominal pain Daytime sleepiness Frequency of urination Memory loss Hallucinations  Blood pressure 112/60, pulse 83, height 5' (1.524 m), weight 141 lb 8 oz (64.2 kg).  Physical Exam  General: The patient is alert and cooperative at the time of the examination.  Skin: 1+ edema around the ankles is noted.   Neurologic Exam  Mental status: The patient is alert and oriented x 2 at the time of the examination (not oriented to date). The Mini-Mental Status Examination done today  shows a total score 25/30. The patient is able to name 7 animals in 30 seconds.   Cranial nerves: Facial symmetry is present. Speech is normal, no aphasia or dysarthria is noted. Extraocular movements are full. Visual fields are full. Mild masking of the face is seen.  Motor: The patient has good strength in all 4 extremities.  Sensory examination: Soft touch sensation is symmetric on the face, arms, and legs.  Coordination: The patient has good finger-nose-finger and heel-to-shin bilaterally. The patient does have some apraxia with the use of the extremities.  Gait and station: The patient has a slightly shuffling type gait, the patient is able to walk independently. Some dystonic posturing of the right arm is seen. Tandem gait was not attempted. The patient usually uses  a walker for ambulation. She was able to arise from a seated position with the arms crossed. Romberg is negative. No drift is seen.  Reflexes: Deep tendon reflexes are symmetric.   MRI brain 06/08/16:  IMPRESSION: This MRI of the brain without contrast shows the following: 1. Chronic ischemic strokes in the right anterior limb of the internal capsule/caudate, left lentiform nucleus and left posterior frontal lobe. The right internal capsule and caudate stroke was acute at the time of the 08/05/2015 MRI and has since evolved. The other small ischemic strokes appear unchanged 2. Mild chronic microvascular ischemic changes elsewhere in the brain. 3. There are no acute findings.   Assessment/Plan:  1. Parkinson's disease  2. Gait disorder  3. Memory disorder  4. Visuospatial apraxia  The patient is now noting some visual complaints with oscillopsia and occasional double vision. This began around the time that gabapentin was started, she will try going down to 1 capsule daily at night. This medication has helped her neck and shoulder discomfort and headaches. The patient is having some hallucinations that  are not threatening to her, we will not treat these issues. The family will split dose the Sinemet 25/100 mg tablet, taking one half tablet at noon and one half tablet around 4 or 5 PM. She will follow-up in 4 months. The patient has developed visuospatial apraxia, she cannot locate objects with her hands.  Jill Alexanders MD 12/21/2016 10:24 AM  [facility] [address] [place], [place] [address]  Phone [phone] Fax [phone]

## 2016-12-21 NOTE — Patient Instructions (Signed)
   Split dose the noon Sinemet 25/100 to 1/2 at noon and 1/2 around 5 pm. Try cutting back on the gabapentin to 100 mg at night to see if the vision problems improve.

## 2017-02-12 ENCOUNTER — Other Ambulatory Visit: Payer: Self-pay | Admitting: Neurology

## 2017-04-26 ENCOUNTER — Encounter: Payer: Self-pay | Admitting: Neurology

## 2017-04-26 ENCOUNTER — Ambulatory Visit: Payer: Medicare Other | Admitting: Neurology

## 2017-04-26 VITALS — BP 104/62 | HR 77 | Ht 60.0 in | Wt 137.0 lb

## 2017-04-26 DIAGNOSIS — G20 Parkinson's disease: Secondary | ICD-10-CM

## 2017-04-26 DIAGNOSIS — R269 Unspecified abnormalities of gait and mobility: Secondary | ICD-10-CM | POA: Diagnosis not present

## 2017-04-26 DIAGNOSIS — R413 Other amnesia: Secondary | ICD-10-CM

## 2017-04-26 DIAGNOSIS — R443 Hallucinations, unspecified: Secondary | ICD-10-CM | POA: Diagnosis not present

## 2017-04-26 MED ORDER — HYDROCODONE-ACETAMINOPHEN 5-325 MG PO TABS: 1.0000 | ORAL_TABLET | Freq: Four times a day (QID) | ORAL | 0 refills | Status: DC | PRN

## 2017-04-26 NOTE — Patient Instructions (Addendum)
   We will discontinue the gabapentin.

## 2017-04-26 NOTE — Progress Notes (Signed)
Reason for visit: Parkinson's disease  Brianna Coleman is an 76 y.o. female  History of present illness:  Brianna Coleman is a 76 year old right-handed white female with a history of Parkinson's disease.  The patient has had some dyskinesias that have improved with reduction in the Sinemet dosing.  The patient is walking with a cane, she has not had any falls since last seen.  The patient continues to have hallucinations several times a week, this is not upsetting to her.  She does have double vision off and on, she has seen an ophthalmologist to check blood work to include a myasthenia profile that was negative.  It is not clear what other blood work was done.  The patient is involved with [employer] exercise program.  She tries to walk frequently.  She continues to have some problems with choking with swallowing, she had a swallow evaluation in 2012.  She sleeps well at night, but she does have a lot of fatigue during the day, she oftentimes will take a nap in the afternoon.  She no longer has any neck pain, she remains on gabapentin 300 mg at night.  The patient denies any vivid dreams at night.  Past Medical History:  Diagnosis Date  . Anemia   . Aortic stenosis    mild AS by 03/20/13 echo [facility])  . Arthritis, degenerative   . Blood in stool   . 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 Chronic renal insufficiency    CKD stage IV, sees Dr Holley Raring in [place]  . Dyslipidemia   . Dysphagia   . Episode of syncope    Near-syncope  . Fall   . Gait disorder   . Gastroesophageal reflux disease   . Heart murmur   . History of blood transfusion   . Hypertension   . Memory disturbance   . Neurogenic bladder   . Parkinson disease ([place])   . 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 RLS (restless legs syndrome)   . Shortness of breath    with exertion  . Stroke [facility])    no residual  . 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 CAROTID ENDARTERECTOMY    . CATARACT EXTRACTION, BILATERAL    . COLONOSCOPY    . EYE SURGERY Bilateral    cataract  . UPPER GI ENDOSCOPY    . vaginal cyst removal  2009    Family History  Problem Relation Age of Onset  . Cancer Mother   . Hypertension Mother   . Heart disease Mother   . Hyperlipidemia Mother   . Heart attack Father   . Hypertension Father   . Diabetes Brother   . Diabetes Brother   . Heart disease Brother   . Hyperlipidemia Brother   . Hypertension Brother   . Varicose Veins Brother   . Hypertension Sister     Social history:  reports that  has never smoked. she has never used smokeless tobacco. She reports that she does not drink alcohol or use drugs.   No Known Allergies  Medications:  Prior to Admission medications   Medication Sig Start Date End Date Taking? Authorizing Provider  aspirin EC 81 MG tablet Take 81 mg by mouth daily.   Yes [provider]  carbidopa-levodopa (SINEMET IR) 25-250 MG tablet TAKE 1 TAB IN MORNING, 1/2 TAB AT NOON, 1 TAB IN EVENING.  02/12/17  Yes Kathrynn Ducking, MD  cholecalciferol (VITAMIN D) 1000 UNITS tablet Take 1,000 Units by mouth daily.   Yes [provider]  conjugated estrogens (PREMARIN) vaginal cream Place 1 Applicatorful vaginally daily as needed. For inflammation   Yes [provider]  donepezil (ARICEPT) 5 MG tablet Take 5 mg by mouth at bedtime.   Yes [provider]  ferrous gluconate (FERGON) 324 MG tablet Take 324 mg by mouth daily with breakfast.   Yes [provider]  gabapentin (NEURONTIN) 100 MG capsule Take 1 capsule (100 mg total) by mouth 2 (two) times daily. 08/11/16  Yes Kathrynn Ducking, MD  HYDROcodone-acetaminophen (NORCO/VICODIN) 5-325 MG tablet Take 1 tablet by mouth every 6 (six) hours as needed. Patient taking differently: Take 1 tablet by mouth every 6 (six) hours as needed for moderate  pain.  07/06/16  Yes Kathrynn Ducking, MD  NIFEdipine (PROCARDIA-XL/ADALAT-CC/NIFEDICAL-XL) 30 MG 24 hr tablet Take 30 mg by mouth daily.   Yes [provider]  ondansetron (ZOFRAN ODT) 4 MG disintegrating tablet Take 1 tablet (4 mg total) by mouth every 8 (eight) hours as needed for nausea or vomiting. 12/18/16  Yes Alfred Levins, [place], MD  pramipexole (MIRAPEX) 1 MG tablet Take 1 mg by mouth 3 (three) times daily.   Yes [provider]  RaNITidine HCl (RANITIDINE 75 PO) Take 1 tablet as needed by mouth.   Yes [provider]    ROS:  Out of a complete 14 system review of symptoms, the patient complains only of the following symptoms, and all other reviewed systems are negative.  Decreased activity, chills, fatigue Runny nose, difficulty swallowing, drooling Eye discharge, eye itching, eye redness, light sensitivity, double vision, eye pain, blurred vision Cough, wheezing, choking cold intolerance, excessive eating, flushing Swollen abdomen, abdominal pain, rectal bleeding, constipation, diarrhea, nausea, vomiting Daytime sleepiness, snoring, acting out dreams Difficulty urinating, incontinence of the bladder, frequency of urination Back pain, joint pain, aching muscles, muscle cramps, walking difficulty Bruising easily Memory loss, dizziness, headache, speech difficulty, weakness Agitation, confusion, decreased concentration, hallucinations   Blood pressure 104/62, pulse 77, height 5' (1.524 m), weight 137 lb (62.1 kg).  Physical Exam  General: The patient is alert and cooperative at the time of the examination.  Skin: No significant peripheral edema is noted.   Neurologic Exam  Mental status: The patient is alert and oriented x 3 at the time of the examination. The Mini-Mental status examination done today shows a total score 20/30.   Cranial nerves: Facial symmetry is present. Speech is normal, no aphasia or dysarthria is noted. Extraocular movements  are full. Visual fields are full.  Motor: The patient has good strength in all 4 extremities.  Sensory examination: Soft touch sensation is symmetric on the face, arms, and legs.  Coordination: The patient has good finger-nose-finger and heel-to-shin bilaterally.  Dyskinesias of the head and neck and body are seen.  Gait and station: The patient is able to arise from a seated position with the arms crossed.  Once up, she can walk without assistance, she usually uses a cane for ambulation.  The patient may occasionally veer to one side or the other, she does not fall.  The patient is able to perform tandem gait.  Romberg is negative.  Reflexes: Deep tendon reflexes are symmetric.   Assessment/Plan:  1.  Parkinson's disease  2.  Gait disturbance  3.  Memory disturbance  4.  History of dysphagia  5.  History of  hallucinations  6.  Double vision  We will try to get the blood work results from her ophthalmologist that looked at several things regarding her double vision.  The patient may have double vision solely because of the history of Parkinson's disease.  The patient remains on low-dose Aricept for the memory.  The family asked about any exercise programs for her Parkinson's, I am not sure that the ACT program is still active through the [facility] physical therapy.  The patient will follow-up in 5 months.  She will stop her gabapentin.  The double vision began around the time of the initiation of this medication.  Jill Alexanders MD 04/26/2017 11:37 AM  [facility] [address] [place], [place] [address]  Phone [phone] Fax [phone]

## 2017-05-18 ENCOUNTER — Telehealth: Payer: Self-pay | Admitting: Neurology

## 2017-05-18 MED ORDER — CARBIDOPA-LEVODOPA 25-100 MG PO TABS: 1.0000 | ORAL_TABLET | Freq: Four times a day (QID) | ORAL | 2 refills | Status: DC

## 2017-05-18 NOTE — Addendum Note (Signed)
Addended by: Kathrynn Ducking on: 05/18/2017 04:59 PM   Modules accepted: Orders

## 2017-05-18 NOTE — Telephone Encounter (Signed)
Patient's daughter calling to get a new Rx for carbidopa-levodopa (SINEMET IR) 25-250 MG tablet called to [facility] on [address] in [place]. The Rx needs to be halved because the patient always cuts the pill in half. The patient will be going to a day care facility and needs patient to be able to take the correct dosage of medication not cutting the pill in half.

## 2017-05-18 NOTE — Telephone Encounter (Signed)
I called the family.  The patient was to be getting the 25/250 mg tablet, 1 in the morning, half at noon, and 1 in the evening, but in reality she was getting a half a tablet 4 times a day.  We will convert her to the 25/100 mg tablets taking 1 times daily.  She is in an adult care situation, they will not accept a 1/2 tablet, then must give her a full tablet of whatever medication she is taking.  I will send in a prescription for the 25/100 mg tablet taking 1 tablet 4 times daily.

## 2017-05-24 ENCOUNTER — Encounter: Payer: Self-pay | Admitting: Emergency Medicine

## 2017-05-24 ENCOUNTER — Observation Stay
Admission: EM | Admit: 2017-05-24 | Discharge: 2017-05-25 | Disposition: A | Discharge status: Home / Self Care | Payer: Medicare Other | Attending: Specialist | Admitting: Specialist

## 2017-05-24 ENCOUNTER — Emergency Department: Payer: Medicare Other

## 2017-05-24 ENCOUNTER — Other Ambulatory Visit: Payer: Self-pay

## 2017-05-24 DIAGNOSIS — Z79899 Other long term (current) drug therapy: Secondary | ICD-10-CM | POA: Diagnosis not present

## 2017-05-24 DIAGNOSIS — Z8673 Personal history of transient ischemic attack (TIA), and cerebral infarction without residual deficits: Secondary | ICD-10-CM | POA: Diagnosis not present

## 2017-05-24 DIAGNOSIS — Z7982 Long term (current) use of aspirin: Secondary | ICD-10-CM | POA: Insufficient documentation

## 2017-05-24 DIAGNOSIS — I129 Hypertensive chronic kidney disease with stage 1 through stage 4 chronic kidney disease, or unspecified chronic kidney disease: Secondary | ICD-10-CM | POA: Insufficient documentation

## 2017-05-24 DIAGNOSIS — N3000 Acute cystitis without hematuria: Secondary | ICD-10-CM | POA: Insufficient documentation

## 2017-05-24 DIAGNOSIS — N319 Neuromuscular dysfunction of bladder, unspecified: Secondary | ICD-10-CM | POA: Insufficient documentation

## 2017-05-24 DIAGNOSIS — R011 Cardiac murmur, unspecified: Secondary | ICD-10-CM | POA: Insufficient documentation

## 2017-05-24 DIAGNOSIS — E785 Hyperlipidemia, unspecified: Secondary | ICD-10-CM | POA: Insufficient documentation

## 2017-05-24 DIAGNOSIS — G4752 REM sleep behavior disorder: Secondary | ICD-10-CM | POA: Insufficient documentation

## 2017-05-24 DIAGNOSIS — M1990 Unspecified osteoarthritis, unspecified site: Secondary | ICD-10-CM | POA: Insufficient documentation

## 2017-05-24 DIAGNOSIS — G2581 Restless legs syndrome: Secondary | ICD-10-CM | POA: Insufficient documentation

## 2017-05-24 DIAGNOSIS — G20 Parkinson's disease: Secondary | ICD-10-CM | POA: Diagnosis not present

## 2017-05-24 DIAGNOSIS — N184 Chronic kidney disease, stage 4 (severe): Secondary | ICD-10-CM | POA: Diagnosis not present

## 2017-05-24 DIAGNOSIS — K219 Gastro-esophageal reflux disease without esophagitis: Secondary | ICD-10-CM | POA: Diagnosis not present

## 2017-05-24 DIAGNOSIS — G459 Transient cerebral ischemic attack, unspecified: Secondary | ICD-10-CM | POA: Diagnosis present

## 2017-05-24 DIAGNOSIS — F0390 Unspecified dementia without behavioral disturbance: Secondary | ICD-10-CM | POA: Insufficient documentation

## 2017-05-24 DIAGNOSIS — I350 Nonrheumatic aortic (valve) stenosis: Secondary | ICD-10-CM | POA: Insufficient documentation

## 2017-05-24 DIAGNOSIS — D649 Anemia, unspecified: Secondary | ICD-10-CM | POA: Insufficient documentation

## 2017-05-24 LAB — BASIC METABOLIC PANEL
ANION GAP: 9 (ref 5–15)
BUN: 53 mg/dL — ABNORMAL HIGH (ref 6–20)
CALCIUM: 9.2 mg/dL (ref 8.9–10.3)
CHLORIDE: 108 mmol/L (ref 101–111)
CO2: 22 mmol/L (ref 22–32)
CREATININE: 2.88 mg/dL — AB (ref 0.44–1.00)
GFR calc non Af Amer: 15 mL/min — ABNORMAL LOW (ref >60)
GFR, EST AFRICAN AMERICAN: 17 mL/min — AB (ref >60)
GLUCOSE: 108 mg/dL — AB (ref 65–99)
POTASSIUM: 4.6 mmol/L (ref 3.5–5.1)
Sodium: 139 mmol/L (ref 135–145)

## 2017-05-24 LAB — APTT: APTT: 29 s (ref 24–36)

## 2017-05-24 LAB — URINALYSIS, COMPLETE (UACMP) WITH MICROSCOPIC
Bacteria, UA: NONE SEEN
Bilirubin Urine: NEGATIVE
Glucose, UA: NEGATIVE mg/dL
Ketones, ur: 5 mg/dL — AB
Nitrite: NEGATIVE
Protein, ur: 100 mg/dL — AB
SPECIFIC GRAVITY, URINE: 1.016 (ref 1.005–1.030)
pH: 5 (ref 5.0–8.0)

## 2017-05-24 LAB — CBC
HCT: 31 % — ABNORMAL LOW (ref 35.0–47.0)
Hemoglobin: 10 g/dL — ABNORMAL LOW (ref 12.0–16.0)
MCH: 30.3 pg (ref 26.0–34.0)
MCHC: 32.3 g/dL (ref 32.0–36.0)
MCV: 93.8 fL (ref 80.0–100.0)
PLATELETS: 171 10*3/uL (ref 150–440)
RBC: 3.31 MIL/uL — AB (ref 3.80–5.20)
RDW: 15.7 % — ABNORMAL HIGH (ref 11.5–14.5)
WBC: 2.6 10*3/uL — AB (ref 3.6–11.0)

## 2017-05-24 LAB — HEPATIC FUNCTION PANEL
ALT: <5 U/L — ABNORMAL LOW (ref 14–54)
AST: 15 U/L (ref 15–41)
Albumin: 3.5 g/dL (ref 3.5–5.0)
Alkaline Phosphatase: 53 U/L (ref 38–126)
BILIRUBIN DIRECT: 0.1 mg/dL (ref 0.1–0.5)
Indirect Bilirubin: 0.6 mg/dL (ref 0.3–0.9)
TOTAL PROTEIN: 6 g/dL — AB (ref 6.5–8.1)
Total Bilirubin: 0.7 mg/dL (ref 0.3–1.2)

## 2017-05-24 LAB — PROTIME-INR
INR: 0.99
Prothrombin Time: 13 seconds (ref 11.4–15.2)

## 2017-05-24 LAB — TROPONIN I

## 2017-05-24 MED ORDER — CARBIDOPA-LEVODOPA 25-100 MG PO TABS
1.0000 | ORAL_TABLET | Freq: Four times a day (QID) | ORAL | Status: DC
  Administered 2017-05-25 (×3): 1 via ORAL
  Filled 2017-05-24 (×6): qty 1

## 2017-05-24 MED ORDER — FAMOTIDINE 20 MG PO TABS
10.0000 mg | ORAL_TABLET | Freq: Every day | ORAL | Status: DC
  Administered 2017-05-25: 09:00:00 10 mg via ORAL
  Filled 2017-05-24: qty 1

## 2017-05-24 MED ORDER — FERROUS GLUCONATE 324 (38 FE) MG PO TABS
324.0000 mg | ORAL_TABLET | Freq: Every day | ORAL | Status: DC
  Administered 2017-05-25: 09:00:00 324 mg via ORAL
  Filled 2017-05-24: qty 1

## 2017-05-24 MED ORDER — ENOXAPARIN SODIUM 40 MG/0.4ML ~~LOC~~ SOLN: 40.0000 mg | SUBCUTANEOUS | Status: DC

## 2017-05-24 MED ORDER — HEPARIN SODIUM (PORCINE) 5000 UNIT/ML IJ SOLN
5000.0000 [IU] | Freq: Three times a day (TID) | INTRAMUSCULAR | Status: DC
  Administered 2017-05-24 – 2017-05-25 (×3): 5000 [IU] via SUBCUTANEOUS
  Filled 2017-05-24 (×3): qty 1

## 2017-05-24 MED ORDER — STROKE: EARLY STAGES OF RECOVERY BOOK
Freq: Once | Status: AC
  Administered 2017-05-24: 19:00:00

## 2017-05-24 MED ORDER — ACETAMINOPHEN 325 MG PO TABS: 650.0000 mg | ORAL_TABLET | ORAL | Status: DC | PRN

## 2017-05-24 MED ORDER — ONDANSETRON 4 MG PO TBDP
4.0000 mg | ORAL_TABLET | Freq: Three times a day (TID) | ORAL | Status: DC | PRN
  Filled 2017-05-24: qty 1

## 2017-05-24 MED ORDER — HYDROCODONE-ACETAMINOPHEN 5-325 MG PO TABS: 1.0000 | ORAL_TABLET | Freq: Four times a day (QID) | ORAL | Status: DC | PRN

## 2017-05-24 MED ORDER — DONEPEZIL HCL 5 MG PO TABS
5.0000 mg | ORAL_TABLET | Freq: Every day | ORAL | Status: DC
  Filled 2017-05-24 (×2): qty 1

## 2017-05-24 MED ORDER — NIFEDIPINE ER 30 MG PO TB24
30.0000 mg | ORAL_TABLET | Freq: Every day | ORAL | Status: DC
  Administered 2017-05-25: 30 mg via ORAL
  Filled 2017-05-24: qty 1

## 2017-05-24 MED ORDER — ASPIRIN EC 81 MG PO TBEC
81.0000 mg | DELAYED_RELEASE_TABLET | Freq: Every day | ORAL | Status: DC
  Administered 2017-05-25: 09:00:00 81 mg via ORAL
  Filled 2017-05-24: qty 1

## 2017-05-24 MED ORDER — CEFUROXIME AXETIL 500 MG PO TABS
500.0000 mg | ORAL_TABLET | Freq: Two times a day (BID) | ORAL | Status: DC
  Filled 2017-05-24 (×2): qty 1

## 2017-05-24 MED ORDER — ACETAMINOPHEN 160 MG/5ML PO SOLN
650.0000 mg | ORAL | Status: DC | PRN
  Filled 2017-05-24: qty 20.3

## 2017-05-24 MED ORDER — PRAMIPEXOLE DIHYDROCHLORIDE 1 MG PO TABS
1.0000 mg | ORAL_TABLET | Freq: Three times a day (TID) | ORAL | Status: DC
  Administered 2017-05-25 (×2): 1 mg via ORAL
  Filled 2017-05-24 (×3): qty 1

## 2017-05-24 MED ORDER — CEFTRIAXONE SODIUM IN DEXTROSE 20 MG/ML IV SOLN
1.0000 g | Freq: Once | INTRAVENOUS | Status: AC
  Administered 2017-05-24: 1 g via INTRAVENOUS
  Filled 2017-05-24: qty 50

## 2017-05-24 MED ORDER — SODIUM CHLORIDE 0.9 % IV SOLN
INTRAVENOUS | Status: DC
  Administered 2017-05-24 – 2017-05-25 (×2): via INTRAVENOUS

## 2017-05-24 MED ORDER — VITAMIN D 1000 UNITS PO TABS
1000.0000 [IU] | ORAL_TABLET | Freq: Every day | ORAL | Status: DC
  Administered 2017-05-25: 1000 [IU] via ORAL
  Filled 2017-05-24: qty 1

## 2017-05-24 MED ORDER — ACETAMINOPHEN 650 MG RE SUPP: 650.0000 mg | RECTAL | Status: DC | PRN

## 2017-05-24 NOTE — Progress Notes (Signed)
Please note patient's PPD was read and is showing no induration or swelling Her PPD is deemed negative

## 2017-05-24 NOTE — ED Notes (Signed)
Patient given ED sandwich tray per Dr. Mariea Clonts.

## 2017-05-24 NOTE — Progress Notes (Signed)
Anticoagulation monitoring(Lovenox):  76 yo  female ordered Lovenox 40 mg Q24h  Filed Weights   05/24/17 1456  Weight: 135 lb (61.2 kg)   BMI    Lab Results  Component Value Date   CREATININE 2.88 (H) 05/24/2017   CREATININE 2.45 (H) 12/18/2016   CREATININE 2.33 (H) 11/27/2013   Estimated Creatinine Clearance: 13.4 mL/min (A) (by C-G formula based on SCr of 2.88 mg/dL (H)). Hemoglobin & Hematocrit     Component Value Date/Time   HGB 10.0 (L) 05/24/2017 1459   HGB 8.8 (L) 11/27/2013 1449   HCT 31.0 (L) 05/24/2017 1459   HCT 28.0 (L) 11/27/2013 1449     Per Protocol for Patient with estCrcl < 15 ml/min and BMI < 40, will transition to Heparin 5000 units SQ Q8H.

## 2017-05-24 NOTE — ED Provider Notes (Signed)
[facility] Emergency Department Provider Note  ____________________________________________  Time seen: Approximately 3:28 PM  I have reviewed the triage vital signs and the nursing notes.   HISTORY  Chief Complaint Weakness    HPI Brianna Coleman is a 76 y.o. female with a history of CVA from right carotid artery embolism, HTN, HL, Parkinson's disease presenting for right facial droop, left arm weakness, decreased responsiveness.  The patient was accompanied by her son, with whom she lives, to a medical appointment today at 2 PM to have a TB test reevaluated.  In the waiting room, she had the acute onset of right facial droop with left arm weakness, and was only intermittently verbally responsive.  This lasted approximately 7 minutes, and at this time all of her symptoms have completely resolved.  They attempted to stand the patient up, and she was unable to do so, but this is not unusual for her Parkinson's.  The patient had a similar more brief episode of decreased responsiveness yesterday, and the family is concerned she might have a UTI,. Although she has no urinary symptoms she has had asymptomatic UTI in the past..  Today, blood sugar was 131 by EMS.  The patient denies any recent trauma, illness, and only recent medication change has been discontinuation of gabapentin for chronic pain.  Past Medical History:  Diagnosis Date  . Anemia   . Aortic stenosis    mild AS by 03/20/13 echo [facility])  . Arthritis, degenerative   . Blood in stool   . 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 Chronic renal insufficiency    CKD stage IV, sees Dr Holley Raring in [place]  . Dyslipidemia   . Dysphagia   . Episode of syncope    Near-syncope  . Fall   . Gait disorder   . Gastroesophageal reflux disease   . Heart murmur   . History of blood transfusion   . Hypertension   . Memory disturbance   . Neurogenic bladder   . Parkinson disease ([place])    . 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 RLS (restless legs syndrome)   . Shortness of breath    with exertion  . Stroke [facility])    no residual  . 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 Hallucinations 04/26/2017  . Apraxia 12/21/2016  . Cervicogenic headache 02/05/2016  . 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 Cerebrovascular accident (CVA) ([place]) 08/12/2015  . Transient alteration of awareness 07/31/2015  . 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 Absolute anemia 01/26/2014  . Aortic valve defect 01/26/2014  . Congestive heart failure ([place]) 01/26/2014  . Benign essential HTN 01/26/2014  . HLD (hyperlipidemia) 01/26/2014  . Pure hypercholesterolemia 01/26/2014  . Avitaminosis D 01/26/2014  . Carotid artery stenosis 09/19/2013  . Carotid artery obstruction 09/19/2013  . Occlusion and stenosis of carotid artery without mention of cerebral infarction 09/14/2013  . Paralysis agitans ([place]) 10/04/2012  . Abnormality of gait 10/04/2012  . Parkinson's disease ([place]) 10/04/2012  . Memory loss 06/21/2012  . Dysphagia, unspecified(787.20) 06/21/2012  . Unspecified essential hypertension 06/21/2012  . Other and unspecified hyperlipidemia 06/21/2012  . Unspecified cataract 06/21/2012  . Cataract 06/21/2012  . Can't get food down 06/21/2012  . Essential (primary) hypertension 06/21/2012    Past Surgical History:  Procedure Laterality Date  . CAROTID ENDARTERECTOMY    .  CATARACT EXTRACTION, BILATERAL    . COLONOSCOPY    . ENDARTERECTOMY Right 09/19/2013   Procedure: ENDARTERECTOMY CAROTID WITH PATCH ANGIOPLASTY;  Surgeon: Elam Dutch, MD;  Location: [facility];  Service: Vascular;  Laterality: Right;  . EYE SURGERY Bilateral    cataract  . UPPER GI ENDOSCOPY    . vaginal cyst removal  2009    Current Outpatient Rx  . Order #: [id number] Class: Historical Med  . Order #:  [id number] Class: Normal  . Order #: [id number] Class: Historical Med  . Order #: [id number] Class: Historical Med  . Order #: [id number] Class: Historical Med  . Order #: [id number] Class: Historical Med  . Order #: [id number] Class: Historical Med  . Order #: [id number] Class: Historical Med  . Order #: [id number] Class: Historical Med  . Order #: [id number] Class: Print  . Order #: [id number] Class: Print    Allergies Patient has no known allergies.  Family History  Problem Relation Age of Onset  . Cancer Mother   . Hypertension Mother   . Heart disease Mother   . Hyperlipidemia Mother   . Heart attack Father   . Hypertension Father   . Diabetes Brother   . Diabetes Brother   . Heart disease Brother   . Hyperlipidemia Brother   . Hypertension Brother   . Varicose Veins Brother   . Hypertension Sister     Social History Social History   Tobacco Use  . Smoking status: Never Smoker  . Smokeless tobacco: Never Used  Substance Use Topics  . Alcohol use: No  . Drug use: No    Review of Systems Constitutional: No fever/chills.  No lightheadedness or syncope.  Positive altered mental status. Eyes: No visual changes.  No blurred or double vision. ENT: No sore throat. No congestion or rhinorrhea. Cardiovascular: Denies chest pain. Denies palpitations. Respiratory: Denies shortness of breath.  No cough. Gastrointestinal: No abdominal pain.  No nausea, no vomiting.  No diarrhea.  No constipation. Genitourinary: Negative for dysuria. Musculoskeletal: Negative for back pain. Skin: Negative for rash. Neurological: Negative for headaches.  Positive for right facial droop, left upper extremity, and altered mental status.  Positive difficulty standing up and walking.  Positive baseline Parkinson's tremor.    ____________________________________________   PHYSICAL EXAM:  VITAL SIGNS: ED Triage Vitals  Enc Vitals Group     BP 05/24/17 1455 119/63     Pulse Rate 05/24/17 1455 72     Resp  --      Temp 05/24/17 1455 97.6 F (36.4 C)     Temp Source 05/24/17 1455 Oral     SpO2 05/24/17 1455 97 %     Weight 05/24/17 1456 135 lb (61.2 kg)     Height 05/24/17 1456 5' (1.524 m)     Head Circumference --      Peak Flow --      Pain Score --      Pain Loc --      Pain Edu? --      Excl. in [place]? --     Constitutional: The patient is alert and oriented x4.  She is answering questions appropriately.  She has baseline chronic illness with a baseline parkinsonian tremor.  Nontoxic. Eyes: Conjunctivae are normal.  EOMI. PERRLA.  No scleral icterus. Head: Atraumatic. Nose: No congestion/rhinnorhea. Mouth/Throat: Mucous membranes are moist.  Neck: No stridor.  Supple.  No JVD.  No meningismus. Cardiovascular: Normal rate, regular rhythm.  Holosystolic murmur without rubs or gallops.  Respiratory: Normal respiratory effort.  No accessory  muscle use or retractions. Lungs CTAB.  No wheezes, rales or ronchi. Gastrointestinal: Soft, nontender and nondistended.  No guarding or rebound.  No peritoneal signs. Musculoskeletal: No LE edema. No ttp in the calves or palpable cords.  Negative Homan's sign. Neurologic: Alert and oriented 3. Speech is clear.  Face and smile symmetric. Tongue is midline. Normal cheek puff. No pronator drift. 5 out of 5 grip, biceps, triceps, hip flexors, plantar flexion and dorsiflexion. Normal sensation to light touch in the bilateral upper and lower extremities, and face.  Heel to shin is difficult to perform due to the patient's parkinsonian tremor.  She has a normal heel to shin with the left heel and the right tibia, but is unable to perform the test with the left leg.    Skin:  Skin is warm, dry and intact. No rash noted. Psychiatric: Mood and affect are normal.   ____________________________________________   LABS (all labs ordered are listed, but only abnormal results are displayed)  Labs Reviewed  BASIC METABOLIC PANEL - Abnormal; Notable for the  following components:      Result Value   Glucose, Bld 108 (*)    BUN 53 (*)    Creatinine, Ser 2.88 (*)    GFR calc non Af Amer 15 (*)    GFR calc Af Amer 17 (*)    All other components within normal limits  CBC - Abnormal; Notable for the following components:   WBC 2.6 (*)    RBC 3.31 (*)    Hemoglobin 10.0 (*)    HCT 31.0 (*)    RDW 15.7 (*)    All other components within normal limits  URINALYSIS, COMPLETE (UACMP) WITH MICROSCOPIC - Abnormal; Notable for the following components:   Color, Urine YELLOW (*)    APPearance HAZY (*)    Hgb urine dipstick SMALL (*)    Ketones, ur 5 (*)    Protein, ur 100 (*)    Leukocytes, UA MODERATE (*)    Squamous Epithelial / LPF 6-30 (*)    All other components within normal limits  HEPATIC FUNCTION PANEL - Abnormal; Notable for the following components:   Total Protein 6.0 (*)    ALT <5 (*)    All other components within normal limits  URINE CULTURE  TROPONIN I  PROTIME-INR  APTT   ____________________________________________  EKG  ED ECG REPORT I, Eula Listen, the attending physician, personally viewed and interpreted this ECG.   Date: 05/24/2017  EKG Time: 1454  Rate: 71  Rhythm: normal sinus rhythm; RBBB  Axis: normal  Intervals:none  ST&T Change: No STEMI.  Nonspecific T wave inversion in V1.  ____________________________________________  RADIOLOGY  Ct Head Wo Contrast  Result Date: 05/24/2017 CLINICAL DATA:  Weakness EXAM: CT HEAD WITHOUT CONTRAST TECHNIQUE: Contiguous axial images were obtained from the base of the skull through the vertex without intravenous contrast. COMPARISON:  06/08/2016 FINDINGS: Brain: Generalized atrophy is identified. Multiple bilateral basal ganglia lacunar infarcts are noted stable from the previous exam. No findings to suggest acute hemorrhage, acute infarction or space-occupying mass lesion are noted. Vascular: No hyperdense vessel or unexpected calcification. Skull: Normal.  Negative for fracture or focal lesion. Sinuses/Orbits: No acute finding. Other: None. IMPRESSION: Chronic atrophic and ischemic changes without acute abnormality. Electronically Signed   By: Inez Catalina M.D.   On: 05/24/2017 15:50    ____________________________________________   PROCEDURES  Procedure(s) performed: None  Procedures  Critical Care performed: No ____________________________________________   INITIAL IMPRESSION / ASSESSMENT  AND PLAN / ED COURSE  Pertinent labs & imaging results that were available during my care of the patient were reviewed by me and considered in my medical decision making (see chart for details).  76 y.o. female with a history of prior CVA and multiple cardiovascular risk factors presenting with an episode of right facial droop, left arm weakness, and decreased responsiveness, as well as a similar episode that occurred yesterday.  The patient is not anticoagulated, and takes a low-dose aspirin daily, which she has not had today because she takes it at night.  Get a CT of the head to evaluate for stroke, and once no intracranial bleeding is confirmed, she will receive a full dose aspirin.  If her CT scan is negative, an MRI will be indicated but difficult due to the patient's inability to lie still due to her parkinsonism.  I have contacted the neurologist on call to come evaluate the patient as well.  Other possible causes include UTI, and she does have some white blood cells and leukocyte esterase but no bacteria seen on her UA.  I will treat her with Rocephin and send a urine culture, but it is unlikely that these findings are consistent with the cause of her episodes today and yesterday.  We will also evaluate for any electrolyte abnormalities.  I anticipate admission for further evaluation and treatment.  ----------------------------------------- 4:39 PM on 05/24/2017 -----------------------------------------  The patient CT scan does not show any  acute intracranial process.  I have spoken with Dr. Doy Mince, the neurologist on-call, who recommends admission for TIA workup.  The patient will be admitted to the hospitalist for further evaluation and treatment. ____________________________________________  FINAL CLINICAL IMPRESSION(S) / ED DIAGNOSES  Final diagnoses:  TIA (transient ischemic attack)  Acute cystitis without hematuria         NEW MEDICATIONS STARTED DURING THIS VISIT:  This SmartLink is deprecated. Use AVSMEDLIST instead to display the medication list for a patient.    Eula Listen, MD 05/24/17 1640

## 2017-05-24 NOTE — ED Triage Notes (Signed)
Pt ems from md office for possible stroke.pt to md's office for weakness. Pt A/o, able to move all ext. hx parkinsons.

## 2017-05-24 NOTE — H&P (Signed)
[facility] NAME: Brianna Coleman    MR#:  [id number]  DATE OF BIRTH:  April 26, 1941  DATE OF ADMISSION:  05/24/2017  PRIMARY CARE PHYSICIAN: Katheren Shams   REQUESTING/REFERRING PHYSICIAN: Eula Listen, MD  CHIEF COMPLAINT:   Chief Complaint  Patient presents with  . Weakness    HISTORY OF PRESENT ILLNESS: Brianna Coleman  is a 76 y.o. 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  Patient went for follow-up with her primary care providers office to have her PPD read that was placed Friday.  When she all of a sudden around 2 PM he developed these symptoms and lasted about 7 minutes.  Her son was with her who witnessed the symptoms.  He reports that she had similar symptoms yesterday.  But they were much brief.  Patient in the ER was noted to have a negative CT scan of the head we are admitting her for a TIA workup.  Her symptoms have now normalized.  She is back to baseline.  PAST MEDICAL HISTORY:   Past Medical History:  Diagnosis Date  . Anemia   . Aortic stenosis    mild AS by 03/20/13 echo [facility])  . Arthritis, degenerative   . Blood in stool   . 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 Chronic renal insufficiency    CKD stage IV, sees Dr Holley Raring in [place]  . Dyslipidemia   . Dysphagia   . Episode of syncope    Near-syncope  . Fall   . Gait disorder   . Gastroesophageal reflux disease   . Heart murmur   . History of blood transfusion   . Hypertension   . Memory disturbance   . Neurogenic bladder   . Parkinson disease ([place])   . 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 RLS (restless legs syndrome)   . Shortness of breath    with exertion  . Stroke [facility])    no residual  . 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 CAROTID ENDARTERECTOMY    . CATARACT EXTRACTION, BILATERAL    . COLONOSCOPY    . ENDARTERECTOMY Right 09/19/2013   Procedure: ENDARTERECTOMY CAROTID WITH PATCH ANGIOPLASTY;  Surgeon: Elam Dutch, MD;  Location: [place];  Service: Vascular;  Laterality: Right;  . EYE SURGERY Bilateral    cataract  . UPPER GI ENDOSCOPY    . vaginal cyst removal  2009    SOCIAL HISTORY:  Social History   Tobacco Use  . Smoking status: Never Smoker  . Smokeless tobacco: Never Used  Substance Use Topics  . Alcohol use: No    FAMILY HISTORY:  Family History  Problem Relation Age of Onset  . Cancer Mother   . Hypertension Mother   . Heart disease Mother   . Hyperlipidemia Mother   . Heart attack Father   . Hypertension Father   . Diabetes Brother   . Diabetes Brother   . Heart disease Brother   . Hyperlipidemia Brother   . Hypertension Brother   . Varicose Veins Brother   . Hypertension Sister     DRUG ALLERGIES: No Known Allergies  REVIEW OF SYSTEMS:   CONSTITUTIONAL: No fever,  fatigue or weakness.  EYES: No blurred or double vision.  EARS, NOSE, AND THROAT: No tinnitus or ear pain.  RESPIRATORY: No cough, shortness of breath, wheezing or hemoptysis.  CARDIOVASCULAR: No chest pain, orthopnea, edema.  GASTROINTESTINAL: No nausea, vomiting, diarrhea or abdominal pain.  GENITOURINARY: No dysuria, hematuria.  ENDOCRINE: No polyuria, nocturia,  HEMATOLOGY: No anemia, easy bruising or bleeding SKIN: No rash or lesion. MUSCULOSKELETAL: No joint pain or arthritis.   NEUROLOGIC: Brief episode of right facial droop, left arm weakness and decreased responsiveness now back to baseline PSYCHIATRY: No anxiety or depression.   MEDICATIONS AT HOME:  Prior to Admission medications   Medication Sig Start Date End Date Taking? Authorizing Provider  aspirin  EC 81 MG tablet Take 81 mg by mouth daily.   Yes [provider]  carbidopa-levodopa (SINEMET IR) 25-100 MG tablet Take 1 tablet by mouth 4 (four) times daily. 05/18/17  Yes Kathrynn Ducking, MD  cholecalciferol (VITAMIN D) 1000 UNITS tablet Take 1,000 Units by mouth daily.   Yes [provider]  conjugated estrogens (PREMARIN) vaginal cream Place 1 Applicatorful vaginally daily as needed.    Yes [provider]  donepezil (ARICEPT) 5 MG tablet Take 5 mg by mouth at bedtime.   Yes [provider]  ferrous gluconate (FERGON) 324 MG tablet Take 324 mg by mouth daily with breakfast.   Yes [provider]  NIFEdipine (PROCARDIA-XL/ADALAT-CC/NIFEDICAL-XL) 30 MG 24 hr tablet Take 30 mg by mouth daily.   Yes [provider]  pramipexole (MIRAPEX) 1 MG tablet Take 1 mg by mouth 3 (three) times daily.   Yes [provider]  RaNITidine HCl (RANITIDINE 75 PO) Take 1 tablet as needed by mouth.   Yes [provider]  HYDROcodone-acetaminophen (NORCO/VICODIN) 5-325 MG tablet Take 1 tablet every 6 (six) hours as needed by mouth. 04/26/17   Kathrynn Ducking, MD  ondansetron (ZOFRAN ODT) 4 MG disintegrating tablet Take 1 tablet (4 mg total) by mouth every 8 (eight) hours as needed for nausea or vomiting. 12/18/16   Rudene Re, MD      PHYSICAL EXAMINATION:   VITAL SIGNS: Blood pressure 119/63, pulse 72, temperature 97.6 F (36.4 C), temperature source Oral, height 5' (1.524 m), weight 135 lb (61.2 kg), SpO2 97 %.  GENERAL:  76 y.o.-year-old patient lying in the bed with no acute distress.  EYES: Pupils equal, round, reactive to light and accommodation. No scleral icterus. Extraocular muscles intact.  HEENT: Head atraumatic, normocephalic. Oropharynx and nasopharynx clear.  NECK:  Supple, no jugular venous distention. No thyroid enlargement, no tenderness.  LUNGS: Normal breath sounds bilaterally, no wheezing, rales,rhonchi or  crepitation. No use of accessory muscles of respiration.  CARDIOVASCULAR: S1, S2 normal. No murmurs, rubs, or gallops.  ABDOMEN: Soft, nontender, nondistended. Bowel sounds present. No organomegaly or mass.  EXTREMITIES: No pedal edema, cyanosis, or clubbing.  NEUROLOGIC: Cranial nerves II through XII are intact. Muscle strength 5/5 in all extremities. Sensation intact. Gait not checked.  PSYCHIATRIC: The patient is alert and oriented x 3.  SKIN: No obvious rash, lesion, or ulcer.   LABORATORY PANEL:   CBC Recent Labs  Lab 05/24/17 1459  WBC 2.6*  HGB 10.0*  HCT 31.0*  PLT 171  MCV 93.8  MCH 30.3  MCHC 32.3  RDW 15.7*   ------------------------------------------------------------------------------------------------------------------  Chemistries  Recent Labs  Lab 05/24/17 1459  NA 139  K 4.6  CL 108  CO2 22  GLUCOSE 108*  BUN 53*  CREATININE 2.88*  CALCIUM 9.2  AST 15  ALT <5*  ALKPHOS 53  BILITOT 0.7   ------------------------------------------------------------------------------------------------------------------ estimated creatinine clearance is 13.4 mL/min (A) (by C-G formula based on SCr of 2.88 mg/dL (H)). ------------------------------------------------------------------------------------------------------------------ No results for input(s): TSH, T4TOTAL, T3FREE, THYROIDAB in the last 72 hours.  Invalid input(s): FREET3   Coagulation profile Recent Labs  Lab 05/24/17 1627  INR 0.99   ------------------------------------------------------------------------------------------------------------------- No results for input(s): DDIMER in the last 72 hours. -------------------------------------------------------------------------------------------------------------------  Cardiac Enzymes Recent Labs  Lab 05/24/17 1459  TROPONINI <0.03    ------------------------------------------------------------------------------------------------------------------ Invalid input(s): POCBNP  ---------------------------------------------------------------------------------------------------------------  Urinalysis    Component Value Date/Time   COLORURINE YELLOW (A) 05/24/2017 1500   APPEARANCEUR HAZY (A) 05/24/2017 1500   APPEARANCEUR Clear 11/27/2013 1449   LABSPEC 1.016 05/24/2017 1500   LABSPEC 1.006 11/27/2013 1449   PHURINE 5.0 05/24/2017 1500   GLUCOSEU NEGATIVE 05/24/2017 1500   GLUCOSEU Negative 11/27/2013 1449   HGBUR SMALL (A) 05/24/2017 1500   BILIRUBINUR NEGATIVE 05/24/2017 1500   BILIRUBINUR Negative 11/27/2013 1449   KETONESUR 5 (A) 05/24/2017 1500   PROTEINUR 100 (A) 05/24/2017 1500   UROBILINOGEN 0.2 09/19/2013 0710   NITRITE NEGATIVE 05/24/2017 1500   LEUKOCYTESUR MODERATE (A) 05/24/2017 1500   LEUKOCYTESUR 1+ 11/27/2013 1449     RADIOLOGY: Ct Head Wo Contrast  Result Date: 05/24/2017 CLINICAL DATA:  Weakness EXAM: CT HEAD WITHOUT CONTRAST TECHNIQUE: Contiguous axial images were obtained from the base of the skull through the vertex without intravenous contrast. COMPARISON:  06/08/2016 FINDINGS: Brain: Generalized atrophy is identified. Multiple bilateral basal ganglia lacunar infarcts are noted stable from the previous exam. No findings to suggest acute hemorrhage, acute infarction or space-occupying mass lesion are noted. Vascular: No hyperdense vessel or unexpected calcification. Skull: Normal. Negative for fracture or focal lesion. Sinuses/Orbits: No acute finding. Other: None. IMPRESSION: Chronic atrophic and ischemic changes without acute abnormality. Electronically Signed   By: Inez Catalina M.D.   On: 05/24/2017 15:50    EKG: Orders placed or performed during the hospital encounter of 05/24/17  . ED EKG  . ED EKG    IMPRESSION AND PLAN: Patient is a 76 year old with TIA-like symptoms  1.  Acute  TIA We will obtain carotid Dopplers Echocardiogram of the heart Continue aspirin Lipid panel in the a.m. Neurology consult PT evaluation Has passed her switch E valve in the ER   2.  Acute cystitis follow urine cultures treat with oral Ceftin  3.  Chronic kidney disease stage IV outpatient nephrology follow-up  4.  Essential hypertension continue with nifedipine  5.  Parkinson's disease continue Mirapex and Sinemet  6.  Miscellaneous Lovenox for DVT prophylaxis     All the records are reviewed and case discussed with ED provider. Management plans discussed with the patient, family and they are in agreement.  CODE STATUS: Code Status History    Date Active Date Inactive Code Status Order ID Comments User Context   09/19/2013 17:21 09/20/2013 17:00 Full Code [id number]  Gabriel Earing, PA-C Inpatient    Advance Directive Documentation     Most Recent Value  Type of Advance Directive  Healthcare Power of Attorney, Living will  Pre-existing out of facility DNR order (yellow form or pink MOST form)  No data  "MOST" Form in Place?  No data       TOTAL TIME TAKING CARE OF THIS PATIENT: 55 minutes.    Dustin Flock M.D on 05/24/2017 at 5:10 PM  Between 7am to 6pm -  Pager - [phone]  After 6pm go to [email or web address] - password [email or web address] [facility]  [place] [facility]  Office  [phone]  CC: Primary care physician; Katheren Shams

## 2017-05-25 ENCOUNTER — Observation Stay: Payer: Medicare Other

## 2017-05-25 ENCOUNTER — Observation Stay
Admit: 2017-05-25 | Discharge: 2017-05-25 | Disposition: A | Discharge status: Home / Self Care | Payer: Medicare Other | Attending: Internal Medicine | Admitting: Internal Medicine

## 2017-05-25 DIAGNOSIS — G459 Transient cerebral ischemic attack, unspecified: Principal | ICD-10-CM

## 2017-05-25 LAB — HEMOGLOBIN A1C
HEMOGLOBIN A1C: 5.2 % (ref 4.8–5.6)
MEAN PLASMA GLUCOSE: 102.54 mg/dL

## 2017-05-25 LAB — ECHOCARDIOGRAM COMPLETE
HEIGHTINCHES: 60 in
WEIGHTICAEL: 2105.6 [oz_av]

## 2017-05-25 LAB — URINE CULTURE

## 2017-05-25 LAB — LIPID PANEL
CHOL/HDL RATIO: 4.3 ratio
Cholesterol: 213 mg/dL — ABNORMAL HIGH (ref 0–200)
HDL: 49 mg/dL (ref >40)
LDL CALC: 147 mg/dL — AB (ref 0–99)
Triglycerides: 86 mg/dL (ref <150)
VLDL: 17 mg/dL (ref 0–40)

## 2017-05-25 MED ORDER — CLOPIDOGREL BISULFATE 75 MG PO TABS: 75.0000 mg | ORAL_TABLET | Freq: Every day | ORAL | 1 refills | Status: AC

## 2017-05-25 MED ORDER — CEFUROXIME AXETIL 500 MG PO TABS
500.0000 mg | ORAL_TABLET | Freq: Every day | ORAL | Status: DC
  Administered 2017-05-25: 500 mg via ORAL
  Filled 2017-05-25: qty 1

## 2017-05-25 NOTE — Care Management Note (Signed)
Case Management Note  Patient Details  Name: LUCYNDA ROSANO MRN: [id number] Date of Birth: Apr 02, 1941  Subjective/Objective:                  Admitted to [facility] with the diagnosis of TIA under observation status. lives with family. Daughter is Dorma [facility] [phone]), Last seen Dr, Kary Kos 05/20/17. Prescriptions are filled at [facility] in [place]. Occupational and physical therapy per [facility] following broken hip last January. [place] post hip surgery in January. Rolling walker, cane, raised toilet seat with bars, and grab bars in the home. Self feed, needs some help with dressing and baths, Good appetite. Last fall was last January.  Action/Plan: Will continue to follow for discharge plans   Expected Discharge Date:                  Expected Discharge Plan:     In-House Referral:     Discharge planning Services     Post Acute Care Choice:    Choice offered to:     DME Arranged:    DME Agency:     HH Arranged:    HH Agency:     Status of Service:     If discussed at [employer] of Stay Meetings, dates discussed:    Additional Comments:  Shelbie Ammons, RN MSN CCM Care Management [phone] 05/25/2017, 9:30 AM

## 2017-05-25 NOTE — Evaluation (Signed)
Clinical/Bedside Swallow Evaluation Patient Details  Name: Brianna Coleman MRN: [id number] Date of Birth: [birth year]  Today's Date: 05/25/2017 Time: SLP Start Time (ACUTE ONLY): 0800 SLP Stop Time (ACUTE ONLY): 0900 SLP Time Calculation (min) (ACUTE ONLY): 60 min  Past Medical History:  Past Medical History:  Diagnosis Date  . Anemia   . Aortic stenosis    mild AS by 03/20/13 echo [facility])  . Arthritis, degenerative   . Blood in stool   . 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 Chronic renal insufficiency    CKD stage IV, sees Dr Holley Raring in [place]  . Dyslipidemia   . Dysphagia   . Episode of syncope    Near-syncope  . Fall   . Gait disorder   . Gastroesophageal reflux disease   . Heart murmur   . History of blood transfusion   . Hypertension   . Memory disturbance   . Neurogenic bladder   . Parkinson disease ([place])   . 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 RLS (restless legs syndrome)   . Shortness of breath    with exertion  . Stroke [facility])    no residual  . 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 CAROTID ENDARTERECTOMY    . CATARACT EXTRACTION, BILATERAL    . COLONOSCOPY    . ENDARTERECTOMY Right 09/19/2013   Procedure: ENDARTERECTOMY CAROTID WITH PATCH ANGIOPLASTY;  Surgeon: Elam Dutch, MD;  Location: [place];  Service: Vascular;  Laterality: Right;  . EYE SURGERY Bilateral    cataract  . UPPER GI ENDOSCOPY    . vaginal cyst removal  2009   HPI:  Pt is a 76 y.o. 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  Patient went for follow-up with her primary care providers office to have her  PPD read that was placed Friday.  When she all of a sudden around 2 PM he developed these symptoms and lasted about 7 minutes.  Her son was with her who witnessed the symptoms.  He reports that she had similar symptoms yesterday.  But they were much brief.  Admitted for a TIA workup.  She is back to baseline now.  MRI and CT revealed Negative for acute infarct. Multiple chronic infarcts in the basal ganglia; Chronic atrophic and ischemic changes.  Daughter endorsed Cognitive decline baseline at home - noted H&P listed "memory disturbance".    Assessment / Plan / Recommendation Clinical Impression  Pt appears to present w/ adequate oropharyngeal phase dysphagia w/ reduced risk for aspiration when following general aspiration precautions as she has been doing at home. Daughter stated they follow aspiration precautions post prior dysphagia tx in past secondary to pt's dx of Parkinson's Dis(no straws, no mixed consistencies). OM exam was wfl w/ no R facial droop noted. Pt consumed trials of thin liquids and purees/soft solids w/ no overt s/s of aspiration noted; no decline in respiratory status or vocal quality appreciated during/post trials. Oral phase c/b min increased mastication time/effort w/ the increased texture but fairly wfl, and pt cleared b/t trials. Pt helped to feed self w/ setup; pt does have Cognitive decline at baseline as endorsed by Daughter, and chart review. Discussed  general aspiration precautions and gave handout in light of pt's baseline comorbidities which can increase risk for dysphagia. NSG and SLP have Pills whole in Puree which pt tolerated well - recommended to continue this at home; no longer swallow w/ thin liquids. Daughter agreed. Pt appears close to/at her baseline re: swallowing. No further skilled ST Services indicated at this time. NSG to reconsult if any decline in status while admitted. Pt/daughter agreed. SLP Visit Diagnosis: Dysphagia, oropharyngeal phase (R13.12)     Aspiration Risk  (reduced following general aspiration precautions)    Diet Recommendation  dysphagia level 3(mech soft) w/ thin liquids - NO Straws. General aspiration precautions; reflux precautions. Reduce distractions during meals.  Medication Administration: Whole meds with puree    Other  Recommendations Recommended Consults: (Dietician f/u) Oral Care Recommendations: Oral care BID;Patient independent with oral care;Staff/trained caregiver to provide oral care Other Recommendations: (n/a)   Follow up Recommendations None      Frequency and Duration    (n/a)       Prognosis Prognosis for Safe Diet Advancement: Good Barriers to Reach Goals: Cognitive deficits(Parkinson's Dis.)      Swallow Study   General Date of Onset: 05/24/17 HPI: Pt is a 76 y.o. 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  Patient went for follow-up with her primary care providers office to have her PPD read that was placed Friday.  When she all of a sudden around 2 PM he developed these symptoms and lasted about 7 minutes.  Her son was with her who witnessed the symptoms.  He reports that she had similar symptoms yesterday.  But they were much brief.  Admitted for a TIA workup.  She is back to baseline now.  MRI and CT revealed Negative for acute infarct. Multiple chronic infarcts in the basal ganglia; Chronic atrophic and ischemic changes.  Daughter endorsed Cognitive decline baseline at home - noted H&P listed "memory disturbance".  Type of Study: Bedside Swallow Evaluation Previous Swallow Assessment: none reported recently but has had dysphagia tx d/t Parkinson's Dis. dx in past Diet Prior to this Study: Dysphagia 3 (soft);Thin liquids(per Daughter's  description at home;  NPO currently) Temperature Spikes Noted: No(wbc 2.6) Respiratory Status: Room air History of Recent Intubation: No Behavior/Cognition: Alert;Cooperative;Pleasant mood;Confused;Distractible;Requires cueing Oral Cavity Assessment: Dry Oral Care Completed by SLP: Recent completion by staff Oral Cavity - Dentition: Adequate natural dentition Vision: Functional for self-feeding Self-Feeding Abilities: Able to feed self;Needs assist;Needs set up Patient Positioning: Upright in bed Baseline Vocal Quality: Low vocal intensity Volitional Cough: Strong Volitional Swallow: Able to elicit    Oral/Motor/Sensory Function Overall Oral Motor/Sensory Function: Within functional limits(no R facial droop noted)   Ice Chips Ice chips: Within functional limits Presentation: Spoon(fed; 3 trials)   Thin Liquid Thin Liquid: Within functional limits Presentation: Cup;Self Fed(~4-5 ozs total) Other Comments: pt does NOT use straws at home     Nectar Thick Nectar Thick Liquid: Not tested   Honey Thick Honey Thick Liquid: Not tested   Puree Puree: Within functional limits Presentation: Spoon(fed; 10+ trials)   Solid   GO   Solid: Impaired Presentation: Spoon(fed; 4 trials) Oral Phase Impairments: Impaired mastication(min increased time needed) Pharyngeal Phase Impairments: (none)    Functional Assessment Tool Used: clinical judgement Functional Limitations: Swallowing Swallow Current Status (  [id number]): At least 1 percent but less than 20 percent impaired, limited or restricted Swallow Goal Status [phone]): At least 1 percent but less than 20 percent impaired, limited or restricted Swallow Discharge Status [phone]): At least 1 percent but less than 20 percent impaired, limited or restricted     Orinda Kenner, MS, CCC-SLP Niccolo Burggraf 05/25/2017,4:48 PM

## 2017-05-25 NOTE — Care Management Obs Status (Signed)
[place] NOTIFICATION   Patient Details  Name: Brianna Coleman MRN: [id number] Date of Birth: 1941-06-16   Medicare Observation Status Notification Given:  Yes    Shelbie Ammons, RN 05/25/2017, 9:29 AM

## 2017-05-25 NOTE — Progress Notes (Signed)
OT Cancellation Note  Patient Details Name: Brianna Coleman MRN: [id number] DOB: [birth year]   Cancelled Treatment:    Reason Eval/Treat Not Completed: Patient at procedure or test/ unavailable. Order received, chart reviewed. Pt out of room for testing. Will re-attempt OT evaluation at later date/time as pt is available and medically appropriate.  Jeni Salles, MPH, MS, OTR/L ascom [phone] 05/25/17, 10:25 AM

## 2017-05-25 NOTE — Evaluation (Signed)
Occupational Therapy Evaluation Patient Details Name: Brianna Coleman MRN: [id number] DOB: Apr 13, 1941 Today's Date: 05/25/2017    History of Present Illness Pt admitted for TIA with complaints of right side facial droop and L side weakness. History includes CVA, HTN, Parkinsons disease.  MRI negative at this time.   Clinical Impression   Pt seen for OT evaluation this date. At baseline, pt furniture cruises in home. Has AD, however does not use. No falls in past 6 months. 1 fall in last year ending in fractured hip. Pt requires assist from family for bathing, dressing, and toileting sometimes due to increased difficulty with sequencing. Pt presents at near baseline functional status for ADL tasks, requiring min to mod assist and verbal cues for sequencing for toileting, dressing, and bathing tasks, where she typically only needs supervision/min guard to min assist for ADL. Extensive education provided to son (after pt left the room for additional testing and formal OT evaluation was completed) on falls prevention, AE/DME, community resources, and compensatory strategies for cognitive deficits in sequencing and safety awareness for ADL tasks. Son very thankful for information and verbalized understanding. Pt would benefit from additional skilled OT services in the home health setting to address functional impairments and deficits including education/training in functional mobility, self care tasks, AE/DME, caregiver education/training in compensatory strategies to support pt in performing/participating in ADL and meaningful activities, and to assess home safety/set up, as pt/son/dtr intend on moving soon into a 1 story home and eager to address safety concerns and set up to best suit the pt's needs.     Follow Up Recommendations  Home health OT;Supervision/Assistance - 24 hour    Equipment Recommendations  Other (comment)(TBD at next venue of care - HH shower head, grab bars for shower, etc.)     Recommendations for Other Services       Precautions / Restrictions Precautions Precautions: Fall Restrictions Weight Bearing Restrictions: No      Mobility Bed Mobility Overal bed mobility: Needs Assistance Bed Mobility: Sit to Supine       Sit to supine: Supervision   General bed mobility comments: not performed as pt received in recliner  Transfers Overall transfer level: Needs assistance Equipment used: Rolling walker (2 wheeled) Transfers: Sit to/from Stand Sit to Stand: Min guard         General transfer comment: VC for hand placement     Balance Overall balance assessment: Needs assistance;History of Falls Sitting-balance support: Bilateral upper extremity supported Sitting balance-Leahy Scale: Good     Standing balance support: Bilateral upper extremity supported Standing balance-Leahy Scale: Fair                             ADL either performed or assessed with clinical judgement   ADL Overall ADL's : Needs assistance/impaired                                       General ADL Comments: near baseline functional independence requiring min to mod assist and verbal cues for sequencing for toileting, dressing, and bathing tasks, where she typically only needs supervision/min guard to min assist for ADL.     Vision Patient Visual Report: No change from baseline Vision Assessment?: Yes Eye Alignment: Within Functional Limits Ocular Range of Motion: Within Functional Limits Alignment/Gaze Preference: Within Defined Limits Tracking/Visual Pursuits: Able to track stimulus in  all quads without difficulty Visual Fields: No apparent deficits Depth Perception: Overshoots     Perception     Praxis      Pertinent Vitals/Pain Pain Assessment: No/denies pain     Hand Dominance Right   Extremity/Trunk Assessment Upper Extremity Assessment Upper Extremity Assessment: Generalized weakness(grossly 3+/5 bilaterally, impaired  FMC/GMC, sensation intact)   Lower Extremity Assessment Lower Extremity Assessment: Defer to PT evaluation;Generalized weakness(grossly 3+/5 bilaterally, impaired coordination)       Communication Communication Communication: Receptive difficulties;Expressive difficulties(mild dementia-like receptive/expressive difficulties)   Cognition Arousal/Alertness: Awake/alert Behavior During Therapy: WFL for tasks assessed/performed Overall Cognitive Status: History of cognitive impairments - at baseline                                 General Comments: son notes gradual cognitive decline over past several months with impaired safety awareness, difficulty with sequencing, difficulty with expressive/receptive language at times   General Comments       Exercises Exercises: Other exercises Other Exercises Other Exercises: Pt educated in hand/foot positioning during functional mobility training to support broader base of support in order to improve standing balance. Pt able to demonstrate with cues Other Exercises: Extensive education provided to son (after pt left the room for additional testing) on falls prevention, AE/DME, community resources, and compensatory strategies for cognitive deficits in sequencing and safety awareness for ADL tasks. Son very thankful for information and verbalized understanding.    Shoulder Instructions      Home Living Family/patient expects to be discharged to:: Private residence Living Arrangements: Children(lives with sister) Available Help at Discharge: Family;Available 24 hours/day(sister and son) Type of Home: House Home Access: Stairs to enter     Home Layout: Two level Alternate Level Stairs-Number of Steps: lives in main floor, kitchen on 2nd floor   Bathroom Shower/Tub: Tub/shower unit   Bathroom Toilet: Handicapped height     Home Equipment: Environmental consultant - 2 wheels;Walker - 4 wheels;Toilet riser;Shower seat   Additional Comments: Per  son/daughter, they are in process of moving to 1 story home to improve ease of access with walk in shower and son eager to install/implement falls prevention strategies/AE/DME      Prior Functioning/Environment Level of Independence: Needs assistance        Comments: furniture cruises in home. Has AD, however does not use. No falls in past 6 months. 1 fall in last year ending in fractured hip. Assist from family for bathing, dressing, toileting sometimes due to increased difficulty with sequencing        OT Problem List: Decreased strength;Impaired vision/perception;Decreased knowledge of use of DME or AE;Decreased coordination;Decreased activity tolerance;Decreased cognition;Impaired UE functional use;Decreased safety awareness;Impaired balance (sitting and/or standing)      OT Treatment/Interventions: Self-care/ADL training;Therapeutic exercise;Energy conservation;Therapeutic activities;Cognitive remediation/compensation;Visual/perceptual remediation/compensation;DME and/or AE instruction;Patient/family education;Balance training;Neuromuscular education    OT Goals(Current goals can be found in the care plan section) Acute Rehab OT Goals Patient Stated Goal: to go home OT Goal Formulation: With patient/family  OT Frequency: Min 2X/week   Barriers to D/C:            Co-evaluation              AM-PAC PT "6 Clicks" Daily Activity     Outcome Measure Help from another person eating meals?: A Little Help from another person taking care of personal grooming?: A Little Help from another person toileting, which includes using toliet, bedpan, or  urinal?: A Little Help from another person bathing (including washing, rinsing, drying)?: A Little Help from another person to put on and taking off regular upper body clothing?: A Little Help from another person to put on and taking off regular lower body clothing?: A Little 6 Click Score: 18   End of Session Equipment Utilized During  Treatment: Rolling walker  Activity Tolerance: Patient tolerated treatment well Patient left: in bed;Other (comment)(staff in to take pt out of room for testing)  OT Visit Diagnosis: Other abnormalities of gait and mobility (R26.89);Muscle weakness (generalized) (M62.81);Other symptoms and signs involving cognitive function;Other symptoms and signs involving the nervous system (R29.898);History of falling (Z91.81)                Time: 8127-5170 OT Time Calculation (min): 24 min Charges:  OT General Charges $OT Visit: 1 Visit OT Evaluation $OT Eval Low Complexity: 1 Low $OT Eval Moderate Complexity: 1 Mod OT Treatments $Self Care/Home Management : 8-22 mins G-Codes: OT G-codes **NOT FOR INPATIENT CLASS** Functional Assessment Tool Used: AM-PAC 6 Clicks Daily Activity;Clinical judgement Functional Limitation: Self care Self Care Current Status (Y1749): At least 20 percent but less than 40 percent impaired, limited or restricted Self Care Goal Status (S4967): At least 20 percent but less than 40 percent impaired, limited or restricted Self Care Discharge Status [phone]): At least 20 percent but less than 40 percent impaired, limited or restricted   Jeni Salles, MPH, MS, OTR/L ascom [phone] 05/25/17, 4:34 PM

## 2017-05-25 NOTE — Evaluation (Signed)
Physical Therapy Evaluation Patient Details Name: Brianna Coleman MRN: [id number] DOB: 04-09-1941 Today's Date: 05/25/2017   History of Present Illness  Pt admitted for TIA with complaints of right side facial droop and L side weakness. History includes CVA, HTN, Parkinsons disease.  MRI negative at this time  Clinical Impression  Pt is a pleasant 76 year old female who was admitted for TIA. All imaging negative at this time. Pt performs transfers and ambulation with cga and RW. Ambulation preformed without AD with decreased balance noted, needs to use RW at all times secondary to falls risk. Pt demonstrates deficits with strength/mobility/balance. Slight decreased strength noted in L side vs R, however very slight. Has difficulty with finger opposition and coordination, however per family, baseline level for patient due to Parkinsons. Close to baseline level. Would benefit from skilled PT to address above deficits and promote optimal return to PLOF. Recommend transition to [facility] upon discharge from acute hospitalization.       Follow Up Recommendations Home health PT    Equipment Recommendations  None recommended by PT    Recommendations for Other Services       Precautions / Restrictions Precautions Precautions: Fall      Mobility  Bed Mobility               General bed mobility comments: not performed as pt received in recliner  Transfers Overall transfer level: Needs assistance Equipment used: Rolling walker (2 wheeled) Transfers: Sit to/from Stand Sit to Stand: Min guard         General transfer comment: safe technique with RW. Slight forward flexion noted in standing. No LOB or knee buckling noted.  Ambulation/Gait Ambulation/Gait assistance: Min guard Ambulation Distance (Feet): 40 Feet Assistive device: Rolling walker (2 wheeled) Gait Pattern/deviations: Step-through pattern     General Gait Details: ambulated in room. Pt request to discontinue further  ambulation due to fatigue. Safe technique with RW. Further ambulation performed without AD.  Stairs            Wheelchair Mobility    Modified Rankin (Stroke Patients Only)       Balance Overall balance assessment: Needs assistance;History of Falls Sitting-balance support: Bilateral upper extremity supported Sitting balance-Leahy Scale: Normal     Standing balance support: Bilateral upper extremity supported Standing balance-Leahy Scale: Fair                               Pertinent Vitals/Pain Pain Assessment: No/denies pain    Home Living Family/patient expects to be discharged to:: Private residence Living Arrangements: Children Available Help at Discharge: Family;Available 24 hours/day Type of Home: House Home Access: Stairs to enter     Home Layout: Two level(kitchen on 2nd story) Home Equipment: Walker - 2 wheels;Walker - 4 wheels Additional Comments: daughter said they are in the process of moving to a one-story home to improve ease of access    Prior Function Level of Independence: Needs assistance         Comments: furniture cruises in home. Has AD, however does not use. No falls in past 6 months. 1 fall in last year ending in fractured hip     Hand Dominance        Extremity/Trunk Assessment   Upper Extremity Assessment Upper Extremity Assessment: Generalized weakness(B UE grossly 3+/5; difficulty with coordination)    Lower Extremity Assessment Lower Extremity Assessment: Generalized weakness(R>L; grossly 3+/5; difficulty with coordination)  Communication   Communication: No difficulties  Cognition Arousal/Alertness: Awake/alert Behavior During Therapy: WFL for tasks assessed/performed Overall Cognitive Status: Within Functional Limits for tasks assessed                                        General Comments      Exercises Other Exercises Other Exercises: Further ambulation performed x 10'  without RW. Min assist given secondary to increased unsteadiness noted. Pt reaches out for furniture in room for support. Cues given   Assessment/Plan    PT Assessment Patient needs continued PT services  PT Problem List Decreased strength;Decreased activity tolerance;Decreased balance;Decreased mobility;Decreased safety awareness       PT Treatment Interventions Gait training;Therapeutic exercise;Balance training;DME instruction    PT Goals (Current goals can be found in the Care Plan section)  Acute Rehab PT Goals Patient Stated Goal: to go home PT Goal Formulation: With patient Time For Goal Achievement: 06/08/17 Potential to Achieve Goals: Good    Frequency Min 2X/week   Barriers to discharge        Co-evaluation               AM-PAC PT "6 Clicks" Daily Activity  Outcome Measure Difficulty turning over in bed (including adjusting bedclothes, sheets and blankets)?: None Difficulty moving from lying on back to sitting on the side of the bed? : None Difficulty sitting down on and standing up from a chair with arms (e.g., wheelchair, bedside commode, etc,.)?: Unable Help needed moving to and from a bed to chair (including a wheelchair)?: A Little Help needed walking in hospital room?: A Little Help needed climbing 3-5 steps with a railing? : A Little 6 Click Score: 18    End of Session Equipment Utilized During Treatment: Gait belt Activity Tolerance: Patient tolerated treatment well Patient left: in chair;with chair alarm set Nurse Communication: Mobility status PT Visit Diagnosis: Unsteadiness on feet (R26.81);Muscle weakness (generalized) (M62.81);Difficulty in walking, not elsewhere classified (R26.2)    Time: 9628-3662 PT Time Calculation (min) (ACUTE ONLY): 18 min   Charges:   PT Evaluation $PT Eval Low Complexity: 1 Low PT Treatments $Gait Training: 8-22 mins   PT G Codes:   PT G-Codes **NOT FOR INPATIENT CLASS** Functional Assessment Tool Used:  AM-PAC 6 Clicks Basic Mobility Functional Limitation: Mobility: Walking and moving around Mobility: Walking and Moving Around Current Status (H4765): At least 40 percent but less than 60 percent impaired, limited or restricted Mobility: Walking and Moving Around Goal Status [phone]): At least 20 percent but less than 40 percent impaired, limited or restricted    Greggory Stallion, PT, DPT [phone]   Reagen Haberman 05/25/2017, 2:48 PM

## 2017-05-25 NOTE — Progress Notes (Signed)
Pharmacist-Provider Communication:  Order for ceftin 500 mg PO BID was changed to ceftin 500 mg PO daily based on renal function. Per protocol.  Lenis Noon, PharmD 05/25/17 7:48 AM

## 2017-05-25 NOTE — Plan of Care (Signed)
Pt with Parkinson's admitted for stroke like symptoms of L sided facial droop and more pronounced slurred speech.  CT/MRI negative.  Also had ECHO and U/S of carotids.  Refused EEG.  Cholesterol slightly elevated and will start on Plavix.  PT recommends Home Health PT.  Hassan Rowan will arrange this tomorrow.  Symptoms have resolved.  She will f/u with her neurologist.  Speech made some recommendations and provided family with written instructions regarding diet and aspiration precautions. Pt will transport home with son after reviewing d/c instructions, f/u appts and new scripts.  Nurse Tech removed IV.

## 2017-05-25 NOTE — Progress Notes (Signed)
*  PRELIMINARY RESULTS* Echocardiogram 2D Echocardiogram has been performed.  [name] 05/25/2017, 11:16 AM

## 2017-05-25 NOTE — Progress Notes (Signed)
SLP Cancellation Note  Patient Details Name: TIMBERLYNN KIZZIAH MRN: [id number] DOB: 08-Jul-1940   Cancelled treatment:       Reason Eval/Treat Not Completed: SLP screened, no needs identified, will sign off(chart reviewed; consulted NSG then daughter and pt). Discussed pt's baseline status as well as her current presentation. Daughter stated pt appears close to/at her baseline, and she endorsed Cognitive decline at baseline. Pt's chart indicated "memory disturbance" in H&P - suspect impacted by h/o CVAs and Parkinson's Dis. Daughter endorsed that pt does not remember dates and orientation to year/date is poor; assistance is needed w/ ADLs at home which family assists with.  ST services will sign off at this time. Daughter will f/u w/ pt's PCP if any need for f/u assessment in the home if indicated. NSG updated.    Orinda Kenner, MS, CCC-SLP Latoshia Monrroy 05/25/2017, 4:50 PM

## 2017-05-25 NOTE — Consult Note (Signed)
Referring Physician: Verdell Carmine    Chief Complaint: Episodes of unresponsiveness  HPI: Brianna Coleman is an 76 y.o. female with a history of PD and dementia who presents with episodes of unresponsiveness.  Patient reports that she recalls the events.  She has had two in the past two days.  On yesterday while in the waiting room she had the acute onset of right facial droop with left arm weakness, and was only intermittently verbally responsive.  This lasted approximately 7 minutes, and by the time pf presentation all of her symptoms had completely resolved.  The patient had a similar more brief episode of decreased responsiveness the day prior.  Patient with a history of CVA.  Was brought in for evaluation.   Date last known well: Date: 05/24/2017 Time last known well: Time: 14:00 tPA Given: No: Resolution of symptoms  Past Medical History:  Diagnosis Date  . Anemia   . Aortic stenosis    mild AS by 03/20/13 echo [facility])  . Arthritis, degenerative   . Blood in stool   . 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 Chronic renal insufficiency    CKD stage IV, sees Dr Holley Raring in [place]  . Dyslipidemia   . Dysphagia   . Episode of syncope    Near-syncope  . Fall   . Gait disorder   . Gastroesophageal reflux disease   . Heart murmur   . History of blood transfusion   . Hypertension   . Memory disturbance   . Neurogenic bladder   . Parkinson disease ([place])   . 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 RLS (restless legs syndrome)   . Shortness of breath    with exertion  . Stroke [facility])    no residual  . 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 CAROTID ENDARTERECTOMY    . CATARACT EXTRACTION, BILATERAL    . COLONOSCOPY    . ENDARTERECTOMY Right 09/19/2013   Procedure: ENDARTERECTOMY CAROTID WITH PATCH ANGIOPLASTY;  Surgeon: Elam Dutch, MD;  Location: [place];  Service:  Vascular;  Laterality: Right;  . EYE SURGERY Bilateral    cataract  . UPPER GI ENDOSCOPY    . vaginal cyst removal  2009    Family History  Problem Relation Age of Onset  . Cancer Mother   . Hypertension Mother   . Heart disease Mother   . Hyperlipidemia Mother   . Heart attack Father   . Hypertension Father   . Diabetes Brother   . Diabetes Brother   . Heart disease Brother   . Hyperlipidemia Brother   . Hypertension Brother   . Varicose Veins Brother   . Hypertension Sister    Social History:  reports that  has never smoked. she has never used smokeless tobacco. She reports that she does not drink alcohol or use drugs.  Allergies: No Known Allergies  Medications:  I have reviewed the patient's current medications. Prior to Admission:  Prior to Admission medications   Medication Sig Start Date End Date Taking? Authorizing Provider  aspirin EC 81 MG tablet Take 81 mg by mouth daily.   Yes [provider]  carbidopa-levodopa (SINEMET IR) 25-100 MG tablet Take 1 tablet by mouth 4 (four) times daily. 05/18/17  Yes Kathrynn Ducking, MD  cholecalciferol (VITAMIN D) 1000 UNITS tablet Take 1,000 Units by mouth daily.   Yes [provider]  conjugated estrogens (PREMARIN) vaginal cream Place 1 Applicatorful vaginally daily as needed.    Yes [provider]  donepezil (ARICEPT) 5 MG tablet Take 5 mg by mouth at bedtime.   Yes [provider]  ferrous gluconate (FERGON) 324 MG tablet Take 324 mg by mouth daily with breakfast.   Yes [provider]  NIFEdipine (PROCARDIA-XL/ADALAT-CC/NIFEDICAL-XL) 30 MG 24 hr tablet Take 30 mg by mouth daily.   Yes [provider]  pramipexole (MIRAPEX) 1 MG tablet Take 1 mg by mouth 3 (three) times daily.   Yes [provider]  RaNITidine HCl (RANITIDINE 75 PO) Take 1 tablet as needed by mouth.   Yes [provider]         HYDROcodone-acetaminophen (NORCO/VICODIN) 5-325 MG  tablet Take 1 tablet every 6 (six) hours as needed by mouth. 04/26/17   Kathrynn Ducking, MD  ondansetron (ZOFRAN ODT) 4 MG disintegrating tablet Take 1 tablet (4 mg total) by mouth every 8 (eight) hours as needed for nausea or vomiting. 12/18/16   Rudene Re, MD       ROS: History obtained from the patient  General ROS: negative for - chills, fatigue, fever, night sweats, weight gain or weight loss Psychological ROS: memory difficulties, mood swings Ophthalmic ROS: negative for - blurry vision, double vision, eye pain or loss of vision ENT ROS: negative for - epistaxis, nasal discharge, oral lesions, sore throat, tinnitus or vertigo Allergy and Immunology ROS: negative for - hives or itchy/watery eyes Hematological and Lymphatic ROS: negative for - bleeding problems, bruising or swollen lymph nodes Endocrine ROS: negative for - galactorrhea, hair pattern changes, polydipsia/polyuria or temperature intolerance Respiratory ROS: negative for - cough, hemoptysis, shortness of breath or wheezing Cardiovascular ROS: negative for - chest pain, dyspnea on exertion, edema or irregular heartbeat Gastrointestinal ROS: negative for - abdominal pain, diarrhea, hematemesis, nausea/vomiting or stool incontinence Genito-Urinary ROS: negative for - dysuria, hematuria, incontinence or urinary frequency/urgency Musculoskeletal ROS: negative for - joint swelling or muscular weakness Neurological ROS: as noted in HPI Dermatological ROS: negative for rash and skin lesion changes  Physical Examination: Blood pressure (!) 178/79, pulse 65, temperature (!) 97.4 F (36.3 C), temperature source Oral, resp. rate 20, height 5' (1.524 m), weight 59.7 kg (131 lb 9.6 oz), SpO2 100 %.  HEENT-  Normocephalic, no lesions, without obvious abnormality.  Normal external eye and conjunctiva.  Normal TM's bilaterally.  Normal auditory canals and external ears. Normal external nose, mucus membranes and septum.  Normal  pharynx. Cardiovascular- S1, S2 normal, pulses palpable throughout   Lungs- chest clear, no wheezing, rales, normal symmetric air entry Abdomen- soft, non-tender; bowel sounds normal; no masses,  no organomegaly Extremities- LE edema Lymph-no adenopathy palpable Musculoskeletal-no joint tenderness, deformity or swelling Skin-warm and dry, no hyperpigmentation, vitiligo, or suspicious lesions  Neurological Examination   Mental Status: Alert, oriented to name and place, thought content appropriate.  Speech fluent with only some mild word finding deficits.  Requires extensive cuing to follow 3 step commands without difficulty. Cranial Nerves: II: Discs flat bilaterally; Visual fields grossly normal, pupils equal, round, reactive to light and accommodation III,IV, VI: ptosis not present, extra-ocular motions intact bilaterally V,VII: mild left facial droop, facial light touch sensation normal bilaterally VIII: hearing normal bilaterally IX,X: gag reflex present XI: bilateral shoulder shrug XII: midline tongue extension Motor: Right : Upper extremity   5/5    Left:     Upper extremity   5/5  Lower extremity  5/5     Lower extremity   5/5 Tone and bulk:normal tone throughout; no atrophy noted Sensory: Pinprick and light touch intact throughout, bilaterally Deep Tendon Reflexes: 2+ and symmetric with absent AJ's bilaterally.  Frontal release signs noted.   Plantars: Right: downgoing   Left: upgoing Cerebellar: Normal finger-to-nos and normal heel-to-shin testing bilaterally Gait: not tested due to safety concerns   Laboratory Studies:  Basic Metabolic Panel: Recent Labs  Lab 05/24/17 1459  NA 139  K 4.6  CL 108  CO2 22  GLUCOSE 108*  BUN 53*  CREATININE 2.88*  CALCIUM 9.2    Liver Function Tests: Recent Labs  Lab 05/24/17 1459  AST 15  ALT <5*  ALKPHOS 53  BILITOT 0.7  PROT 6.0*  ALBUMIN 3.5   No results for input(s): LIPASE, AMYLASE in the last 168 hours. No  results for input(s): AMMONIA in the last 168 hours.  CBC: Recent Labs  Lab 05/24/17 1459  WBC 2.6*  HGB 10.0*  HCT 31.0*  MCV 93.8  PLT 171    Cardiac Enzymes: Recent Labs  Lab 05/24/17 1459  TROPONINI <0.03    BNP: Invalid input(s): POCBNP  CBG: No results for input(s): GLUCAP in the last 168 hours.  Microbiology: Results for orders placed or performed during the hospital encounter of 09/18/13  Surgical pcr screen     Status: None   Collection Time: 09/18/13 12:59 PM  Result Value Ref Range Status   MRSA, PCR NEGATIVE NEGATIVE Final   Staphylococcus aureus NEGATIVE NEGATIVE Final    Comment:        The Xpert SA Assay (FDA approved for NASAL specimens in patients over 56 years of age), is one component of a comprehensive surveillance program.  Test performance has been validated by [employer] for patients greater than or equal to 25 year old. It is not intended to diagnose infection nor to guide or monitor treatment.    Coagulation Studies: Recent Labs    05/24/17 1627  LABPROT 13.0  INR 0.99    Urinalysis:  Recent Labs  Lab 05/24/17 1500  COLORURINE YELLOW*  LABSPEC 1.016  PHURINE 5.0  GLUCOSEU NEGATIVE  HGBUR SMALL*  BILIRUBINUR NEGATIVE  KETONESUR 5*  PROTEINUR 100*  NITRITE NEGATIVE  LEUKOCYTESUR MODERATE*    Lipid Panel:    Component Value Date/Time   CHOL 213 (H) 05/25/2017 0502   TRIG 86 05/25/2017 0502   HDL 49 05/25/2017 0502   CHOLHDL 4.3 05/25/2017 0502   VLDL 17 05/25/2017 0502   LDLCALC 147 (H) 05/25/2017 0502    HgbA1C:  Lab Results  Component Value Date   HGBA1C 5.2 05/25/2017    Urine Drug Screen:  No results found for: LABOPIA, COCAINSCRNUR, LABBENZ, AMPHETMU, THCU, LABBARB  Alcohol Level: No results for input(s): ETH in the last 168 hours.   Imaging: Ct Head Wo Contrast  Result Date: 05/24/2017 CLINICAL DATA:  Weakness EXAM: CT HEAD WITHOUT CONTRAST TECHNIQUE: Contiguous axial images were obtained from  the base of the skull through the vertex without intravenous contrast. COMPARISON:  06/08/2016 FINDINGS: Brain: Generalized atrophy is identified. Multiple bilateral basal ganglia lacunar infarcts are noted stable from the previous exam. No findings to suggest acute hemorrhage, acute infarction or space-occupying mass lesion are noted. Vascular: No hyperdense vessel or unexpected calcification. Skull: Normal. Negative for fracture or focal lesion. Sinuses/Orbits: No acute finding. Other: None. IMPRESSION: Chronic atrophic and ischemic changes without acute abnormality. Electronically Signed   By: Linus Mako.D.  On: 05/24/2017 15:50   Mr Brain Wo Contrast  Result Date: 05/25/2017 CLINICAL DATA:  Stroke.  Right facial droop and left arm weakness. EXAM: MRI HEAD WITHOUT CONTRAST TECHNIQUE: Multiplanar, multiecho pulse sequences of the brain and surrounding structures were obtained without intravenous contrast. COMPARISON:  CT head 05/24/2017 FINDINGS: Brain: Negative for acute infarct Multiple chronic infarcts in the basal ganglia bilaterally. Small chronic infarct left parietal lobe. Negative for hemorrhage or mass. Ventricle size normal. No shift of the midline structures. Vascular: Normal arterial flow voids. Skull and upper cervical spine: Negative Sinuses/Orbits: Mucosal edema paranasal sinuses. Bilateral cataract removal. Other: Image quality degraded by motion IMPRESSION: Negative for acute infarct. Multiple chronic infarcts in the basal ganglia bilaterally. Small cortical infarct left parietal lobe. Electronically Signed   By: Franchot Gallo M.D.   On: 05/25/2017 11:01   US Carotid Bilateral (at [facility] Only)  Result Date: 05/25/2017 CLINICAL DATA:  TIA symptoms, hypertension, syncope, hyperlipidemia EXAM: BILATERAL CAROTID DUPLEX ULTRASOUND TECHNIQUE: Pearline Cables scale imaging, color Doppler and duplex ultrasound were performed of bilateral carotid and vertebral arteries in the neck. COMPARISON:   01/01/2010 FINDINGS: Criteria: Quantification of carotid stenosis is based on velocity parameters that correlate the residual internal carotid diameter with NASCET-based stenosis levels, using the diameter of the distal internal carotid lumen as the denominator for stenosis measurement. The following velocity measurements were obtained: RIGHT ICA:  66/13 cm/sec CCA:  841/3 cm/sec SYSTOLIC ICA/CCA RATIO:  1.2 DIASTOLIC ICA/CCA RATIO:  1.7 ECA:  80 cm/sec LEFT ICA:  108/28 cm/sec CCA:  24/40 cm/sec SYSTOLIC ICA/CCA RATIO:  2.0 DIASTOLIC ICA/CCA RATIO:  1.02 ECA:  105 cm/sec RIGHT CAROTID ARTERY: Moderate heterogeneous carotid atherosclerosis and intimal thickening. No significant luminal narrowing by grayscale imaging. No hemodynamically significant ICA stenosis, velocity elevation, or turbulent flow. Degree of narrowing less than 50% by ultrasound criteria RIGHT VERTEBRAL ARTERY:  Antegrade LEFT CAROTID ARTERY: Similar moderate heterogeneous partially calcified left carotid bifurcation atherosclerosis. Despite this, there is no hemodynamically significant left ICA stenosis, velocity elevation, or significant turbulent flow. Degree of narrowing also estimated less than 50% by ultrasound criteria. LEFT VERTEBRAL ARTERY:  Limited visualization but appears antegrade IMPRESSION: Moderate bilateral carotid atherosclerosis. No hemodynamically significant ICA stenosis by ultrasound criteria. Degree of narrowing less than 50% bilaterally. Patent antegrade vertebral flow bilaterally. Limited visualization of the left vertebral artery. Electronically Signed   By: Jerilynn Mages.  Shick M.D.   On: 05/25/2017 12:40    Assessment: 76 y.o. female presenting with two episodes of unresponsiveness.  Unclear etiology.  TIA on the differential as well as seizure. MRI of the brain reviewed and shows chronic ischemic changes but no acute changes.  Carotid dopplers show no evidence of hemodynamically significant stenosis.  Echocardiogram pending.   A1c 5.2, LDL 147.  Stroke Risk Factors - hyperlipidemia and hypertension  Plan: 1. PT consult, OT consult, Speech consult 2. Echocardiogram pending 3. Prophylactic therapy-ASA 81mg  and Plavix 75mg  daily 4. Telemetry monitoring 5. Frequent neuro checks 6. EEG 7.  If EEG and Echo unremarkable would have patient continue on ASA and Plavix and follow up with Dr. Jannifer Franklin as an outpatient.    Case discussed with Dr. Rolanda Lundborg, MD Neurology [phone] 05/25/2017, 2:13 PM  Addendum: Patient refused EEG.  Will follow up with Dr. Jannifer Franklin who will determine continued need on an outpatient basis.    Alexis Goodell, MD Neurology [phone]

## 2017-05-26 ENCOUNTER — Telehealth: Payer: Self-pay | Admitting: Neurology

## 2017-05-26 DIAGNOSIS — R531 Weakness: Secondary | ICD-10-CM

## 2017-05-26 NOTE — Telephone Encounter (Signed)
Called and spoke with daughter. Scheduled office visit for 06/07/17 at 12pm, check in 1130am. She verbalized understanding.   Advised I will have someone call her back to scheduled the EEG that Dr. Jannifer Franklin ordered. She verbalized understanding.

## 2017-05-26 NOTE — Discharge Summary (Signed)
[facility] [place] NAME: Brianna Coleman    MR#:  [id number]  DATE OF BIRTH:  October 20, 1940  DATE OF ADMISSION:  05/24/2017 ADMITTING PHYSICIAN: Dustin Flock, MD  DATE OF DISCHARGE: 05/25/2017  6:05 PM  PRIMARY CARE PHYSICIAN: [facility]    ADMISSION DIAGNOSIS:  TIA (transient ischemic attack) [G45.9] Acute cystitis without hematuria [N30.00]  DISCHARGE DIAGNOSIS:  Active Problems:   TIA (transient ischemic attack)   SECONDARY DIAGNOSIS:   Past Medical History:  Diagnosis Date  . Anemia   . Aortic stenosis    mild AS by 03/20/13 echo [facility])  . Arthritis, degenerative   . Blood in stool   . 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 Chronic renal insufficiency    CKD stage IV, sees Dr Holley Raring in [place]  . Dyslipidemia   . Dysphagia   . Episode of syncope    Near-syncope  . Fall   . Gait disorder   . Gastroesophageal reflux disease   . Heart murmur   . History of blood transfusion   . Hypertension   . Memory disturbance   . Neurogenic bladder   . Parkinson disease ([place])   . 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 RLS (restless legs syndrome)   . Shortness of breath    with exertion  . Stroke [facility])    no residual  . 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  1. TIA/CVA-this was a working diagnoses given patient's transient left-sided facial droop and weakness. She underwent an extensive workup including CT head, MRI of the brain which was negative for acute CVA. -She was seen by neurology who recommended getting an  EEG but the patient refused. Patient's carotid duplex also showed no evidence of hemodynamically significant carotid stenosis. Echocardiogram showed no evidence of thrombus. -Patient is being discharged home with home health services as her workup has been negative. She will continue dual antiplatelet therapy with aspirin, Plavix and continue her statin.  2. History of Parkinson's disease-patient will continue her Sinemet.  3. Dementia-patient will continue Aricept.  4. Essential hypertension-patient will continue her nifedipine.  5. Restless Leg syndrome - pt. Will cont. Her Requip.   6. GERD - pt. Will cont. Ranitidine.   7. Chronic kidney disease stage IV-patient's creatinine is close to baseline and can be further followed as an outpatient.  DISCHARGE CONDITIONS:   Stable.   CONSULTS OBTAINED:  Treatment Team:  Alexis Goodell, MD  DRUG ALLERGIES:  No Known Allergies  DISCHARGE MEDICATIONS:   Allergies as of 05/25/2017   No Known Allergies     Medication List    TAKE these medications   aspirin EC 81 MG tablet Take 81 mg by mouth daily.   carbidopa-levodopa 25-100 MG tablet Commonly known as:  SINEMET IR Take 1 tablet by mouth 4 (four) times daily.   cholecalciferol 1000 units tablet Commonly known as:  VITAMIN D Take 1,000 Units by mouth daily.   clopidogrel 75 MG tablet Commonly known as:  PLAVIX Take 1 tablet (75 mg total) by mouth daily.   donepezil 5 MG tablet Commonly known as:  ARICEPT Take 5  mg by mouth at bedtime.   ferrous gluconate 324 MG tablet Commonly known as:  FERGON Take 324 mg by mouth daily with breakfast.   HYDROcodone-acetaminophen 5-325 MG tablet Commonly known as:  NORCO/VICODIN Take 1 tablet every 6 (six) hours as needed by mouth.   NIFEdipine 30 MG 24 hr tablet Commonly known as:  PROCARDIA-XL/ADALAT-CC/NIFEDICAL-XL Take 30 mg by mouth daily.   ondansetron 4 MG disintegrating tablet Commonly known as:  ZOFRAN ODT Take 1  tablet (4 mg total) by mouth every 8 (eight) hours as needed for nausea or vomiting.   pramipexole 1 MG tablet Commonly known as:  MIRAPEX Take 1 mg by mouth 3 (three) times daily.   PREMARIN vaginal cream Generic drug:  conjugated estrogens Place 1 Applicatorful vaginally daily as needed.   RANITIDINE 75 PO Take 1 tablet as needed by mouth.         DISCHARGE INSTRUCTIONS:   DIET:  Cardiac diet  DISCHARGE CONDITION:  Stable  ACTIVITY:  Activity as tolerated  OXYGEN:  Home Oxygen: No.   Oxygen Delivery: room air  DISCHARGE LOCATION:  Home with Home Health PT, RN.    If you experience worsening of your admission symptoms, develop shortness of breath, life threatening emergency, suicidal or homicidal thoughts you must seek medical attention immediately by calling 911 or calling your MD immediately  if symptoms less severe.  You Must read complete instructions/literature along with all the possible adverse reactions/side effects for all the Medicines you take and that have been prescribed to you. Take any new Medicines after you have completely understood and accpet all the possible adverse reactions/side effects.   Please note  You were cared for by a hospitalist during your hospital stay. If you have any questions about your discharge medications or the care you received while you were in the hospital after you are discharged, you can call the unit and asked to speak with the hospitalist on call if the hospitalist that took care of you is not available. Once you are discharged, your primary care physician will handle any further medical issues. Please note that NO REFILLS for any discharge medications will be authorized once you are discharged, as it is imperative that you return to your primary care physician (or establish a relationship with a primary care physician if you do not have one) for your aftercare needs so that they can reassess your need for medications and  monitor your lab values.     Today   No acute complaints or events overnight.  Family at bedside.  VITAL SIGNS:  Blood pressure (!) 179/65, pulse 71, temperature 97.8 F (36.6 C), resp. rate 20, height 5' (1.524 m), weight 59.7 kg (131 lb 9.6 oz), SpO2 99 %.  I/O:  No intake or output data in the 24 hours ending 05/26/17 1543  PHYSICAL EXAMINATION:  GENERAL:  76 y.o.-year-old patient lying in the bed with no acute distress.  EYES: Pupils equal, round, reactive to light and accommodation. No scleral icterus. Extraocular muscles intact.  HEENT: Head atraumatic, normocephalic. Oropharynx and nasopharynx clear.  NECK:  Supple, no jugular venous distention. No thyroid enlargement, no tenderness.  LUNGS: Normal breath sounds bilaterally, no wheezing, rales,rhonchi. No use of accessory muscles of respiration.  CARDIOVASCULAR: S1, S2 normal. No murmurs, rubs, or gallops.  ABDOMEN: Soft, non-tender, non-distended. Bowel sounds present. No organomegaly or mass.  EXTREMITIES: No pedal edema, cyanosis, or clubbing.  NEUROLOGIC: Cranial nerves II through XII are intact. No focal motor or  sensory defecits b/l. Globally weak PSYCHIATRIC: The patient is alert and oriented x 1.  SKIN: No obvious rash, lesion, or ulcer.   DATA REVIEW:   CBC Recent Labs  Lab 05/24/17 1459  WBC 2.6*  HGB 10.0*  HCT 31.0*  PLT 171    Chemistries  Recent Labs  Lab 05/24/17 1459  NA 139  K 4.6  CL 108  CO2 22  GLUCOSE 108*  BUN 53*  CREATININE 2.88*  CALCIUM 9.2  AST 15  ALT <5*  ALKPHOS 53  BILITOT 0.7    Cardiac Enzymes Recent Labs  Lab 05/24/17 1459  TROPONINI <0.03    Microbiology Results  Results for orders placed or performed during the hospital encounter of 05/24/17  Urine culture     Status: Abnormal   Collection Time: 05/24/17  3:00 PM  Result Value Ref Range Status   Specimen Description URINE, RANDOM  Final   Special Requests NONE  Final   Culture MULTIPLE SPECIES PRESENT,  SUGGEST RECOLLECTION (A)  Final   Report Status 05/25/2017 FINAL  Final    RADIOLOGY:  Ct Head Wo Contrast  Result Date: 05/24/2017 CLINICAL DATA:  Weakness EXAM: CT HEAD WITHOUT CONTRAST TECHNIQUE: Contiguous axial images were obtained from the base of the skull through the vertex without intravenous contrast. COMPARISON:  06/08/2016 FINDINGS: Brain: Generalized atrophy is identified. Multiple bilateral basal ganglia lacunar infarcts are noted stable from the previous exam. No findings to suggest acute hemorrhage, acute infarction or space-occupying mass lesion are noted. Vascular: No hyperdense vessel or unexpected calcification. Skull: Normal. Negative for fracture or focal lesion. Sinuses/Orbits: No acute finding. Other: None. IMPRESSION: Chronic atrophic and ischemic changes without acute abnormality. Electronically Signed   By: Inez Catalina M.D.   On: 05/24/2017 15:50   Mr Brain Wo Contrast  Result Date: 05/25/2017 CLINICAL DATA:  Stroke.  Right facial droop and left arm weakness. EXAM: MRI HEAD WITHOUT CONTRAST TECHNIQUE: Multiplanar, multiecho pulse sequences of the brain and surrounding structures were obtained without intravenous contrast. COMPARISON:  CT head 05/24/2017 FINDINGS: Brain: Negative for acute infarct Multiple chronic infarcts in the basal ganglia bilaterally. Small chronic infarct left parietal lobe. Negative for hemorrhage or mass. Ventricle size normal. No shift of the midline structures. Vascular: Normal arterial flow voids. Skull and upper cervical spine: Negative Sinuses/Orbits: Mucosal edema paranasal sinuses. Bilateral cataract removal. Other: Image quality degraded by motion IMPRESSION: Negative for acute infarct. Multiple chronic infarcts in the basal ganglia bilaterally. Small cortical infarct left parietal lobe. Electronically Signed   By: Franchot Gallo M.D.   On: 05/25/2017 11:01   US Carotid Bilateral (at [facility])  Result Date: 05/25/2017 CLINICAL DATA:   TIA symptoms, hypertension, syncope, hyperlipidemia EXAM: BILATERAL CAROTID DUPLEX ULTRASOUND TECHNIQUE: Pearline Cables scale imaging, color Doppler and duplex ultrasound were performed of bilateral carotid and vertebral arteries in the neck. COMPARISON:  01/01/2010 FINDINGS: Criteria: Quantification of carotid stenosis is based on velocity parameters that correlate the residual internal carotid diameter with NASCET-based stenosis levels, using the diameter of the distal internal carotid lumen as the denominator for stenosis measurement. The following velocity measurements were obtained: RIGHT ICA:  66/13 cm/sec CCA:  371/0 cm/sec SYSTOLIC ICA/CCA RATIO:  1.2 DIASTOLIC ICA/CCA RATIO:  1.7 ECA:  80 cm/sec LEFT ICA:  108/28 cm/sec CCA:  62/69 cm/sec SYSTOLIC ICA/CCA RATIO:  2.0 DIASTOLIC ICA/CCA RATIO:  4.85 ECA:  105 cm/sec RIGHT CAROTID ARTERY: Moderate heterogeneous carotid atherosclerosis and intimal thickening. No significant luminal narrowing by grayscale  imaging. No hemodynamically significant ICA stenosis, velocity elevation, or turbulent flow. Degree of narrowing less than 50% by ultrasound criteria RIGHT VERTEBRAL ARTERY:  Antegrade LEFT CAROTID ARTERY: Similar moderate heterogeneous partially calcified left carotid bifurcation atherosclerosis. Despite this, there is no hemodynamically significant left ICA stenosis, velocity elevation, or significant turbulent flow. Degree of narrowing also estimated less than 50% by ultrasound criteria. LEFT VERTEBRAL ARTERY:  Limited visualization but appears antegrade IMPRESSION: Moderate bilateral carotid atherosclerosis. No hemodynamically significant ICA stenosis by ultrasound criteria. Degree of narrowing less than 50% bilaterally. Patent antegrade vertebral flow bilaterally. Limited visualization of the left vertebral artery. Electronically Signed   By: Jerilynn Mages.  Shick M.D.   On: 05/25/2017 12:40      Management plans discussed with the patient, family and they are in  agreement.  CODE STATUS:  Code Status History    Date Active Date Inactive Code Status Order ID Comments User Context   05/24/2017 17:30 05/25/2017 21:10 Full Code [id number]  Dustin Flock, MD ED   09/19/2013 17:21 09/20/2013 17:00 Full Code [id number]  Gabriel Earing, PA-C Inpatient    Advance Directive Documentation     Most Recent Value  Type of Advance Directive  Healthcare Power of [place], Living will  Pre-existing out of facility DNR order (yellow form or pink MOST form)  No data  "MOST" Form in Place?  No data      TOTAL TIME TAKING CARE OF THIS PATIENT: 40 minutes.    Henreitta Leber M.D on 05/26/2017 at 3:43 PM  Between 7am to 6pm - Pager - [phone]  After 6pm go to [email or web address] - [facility]  Office  [phone]  CC: Primary care physician; Katheren Shams

## 2017-05-26 NOTE — Telephone Encounter (Signed)
Patient's daughter is calling. Patient was discharged from the hospital yesterday after having a TIA. She refused to have an EEG in the hospital and her daughter wants to know if an order can be put in for her to have that test in our office. Please call and advise.

## 2017-05-26 NOTE — Telephone Encounter (Signed)
I called and talked with the daughter.  The patient was just in the hospital for transient episodes of right-sided weakness.  The MRI was unremarkable.  The had ordered an EEG study in the hospital but the patient refused.  I would order the EEG is in our office.  We will get a revisit set up for the patient.

## 2017-05-26 NOTE — Care Management (Signed)
Telephone call to son, Suzy Bouchard. Discussed home health agencies. Chose [facility]. Referral for home health services faxed to [facility]. Shelbie Ammons RN MSN CCM Care Management [phone]

## 2017-05-26 NOTE — Addendum Note (Signed)
Addended by: Kathrynn Ducking on: 05/26/2017 04:19 PM   Modules accepted: Orders

## 2017-06-03 ENCOUNTER — Other Ambulatory Visit: Payer: Medicare Other

## 2017-06-03 ENCOUNTER — Telehealth: Payer: Self-pay | Admitting: Neurology

## 2017-06-03 NOTE — Telephone Encounter (Signed)
Dr. Willis- FYI 

## 2017-06-03 NOTE — Telephone Encounter (Signed)
Events noted

## 2017-06-03 NOTE — Telephone Encounter (Signed)
Pt son(on DPR) called to inform that pt in on her way to [facility] due to TIA  and son stated they will try to have the EEG done there.  No call back requested

## 2017-06-07 ENCOUNTER — Ambulatory Visit: Payer: Self-pay | Admitting: Neurology

## 2017-06-10 ENCOUNTER — Other Ambulatory Visit: Payer: Self-pay | Admitting: Neurology

## 2017-06-10 MED ORDER — DONEPEZIL HCL 5 MG PO TABS: 5.0000 mg | ORAL_TABLET | Freq: Every day | ORAL | 0 refills | Status: DC

## 2017-06-17 ENCOUNTER — Encounter: Payer: Self-pay | Admitting: Neurology

## 2017-06-17 ENCOUNTER — Ambulatory Visit: Payer: Medicare Other | Admitting: Neurology

## 2017-06-17 DIAGNOSIS — I951 Orthostatic hypotension: Secondary | ICD-10-CM | POA: Insufficient documentation

## 2017-06-17 HISTORY — DX: Orthostatic hypotension: I95.1

## 2017-06-17 MED ORDER — CARBIDOPA-LEVODOPA ER 25-100 MG PO TBCR: 1.0000 | EXTENDED_RELEASE_TABLET | Freq: Four times a day (QID) | ORAL | 5 refills | Status: DC

## 2017-06-17 NOTE — Patient Instructions (Signed)
   We will convert to Sinemet CR and use the compression stockings.  Go to 50 mg of Vimpat for 1 week, then stop. Stay on [place] for now.

## 2017-06-17 NOTE — Progress Notes (Signed)
Reason for visit: Parkinson's disease, dementia  Brianna Coleman is a 76 y.o. female  History of present illness:  Brianna Coleman is a 76 year old right-handed white female with a history of Parkinson's disease associated with a dementia.  The patient has been on Sinemet 25/100 mg tablets taking 1 tablet 4 times daily.  Beginning in December 2018, the patient has had 4 separate hospitalizations for episodes of altered mental status.  The first hospitalization occurred on 24 May 2017, the patient was admitted to [facility] with left-sided weakness.  The patient has had 3 other admissions to the [facility], one on the 13th, 14th, and 11 June 2017.  The episodes are associated with unresponsiveness, the patient has what looks like dyskinesias, and she developed some slight weakness of the left arm.  The patient may have left facial droop.  The patient claims that she can hear what people are saying but she cannot respond to them.  Prior to the hospitalization on 14 December, the patient fell at home, when she went to the hospital the MRI revealed evidence of subarachnoid blood in the left frontal and right parietal area.  There is some question of an acute stroke in the right basal ganglia area.  The patient had a 2D echocardiogram and an EEG study.  The EEG revealed diffuse slowing.  The patient had presumed seizures and she was placed on Keppra and eventually placed on Vimpat as well.  The patient has been noted to have drops in blood pressure that may occur after eating.  The patient generally will have events while she is sitting, not while lying down. The patient comes to the office today on an urgent basis for an evaluation of the above events.  Past Medical History:  Diagnosis Date  . Anemia   . Aortic stenosis    mild AS by 03/20/13 echo [facility])  . Arthritis, degenerative   . Blood in stool   . 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 Chronic  renal insufficiency    CKD stage IV, sees Dr Holley Raring in [place]  . Dyslipidemia   . Dysphagia   . Episode of syncope    Near-syncope  . Fall   . Gait disorder   . Gastroesophageal reflux disease   . Heart murmur   . History of blood transfusion   . Hypertension   . Memory disturbance   . Neurogenic bladder   . Orthostatic hypotension 06/17/2017  . Parkinson disease ([place])   . 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 RLS (restless legs syndrome)   . Shortness of breath    with exertion  . Stroke [facility])    no residual  . 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 CAROTID ENDARTERECTOMY    . CATARACT EXTRACTION, BILATERAL    . COLONOSCOPY    . ENDARTERECTOMY Right 09/19/2013   Procedure: ENDARTERECTOMY CAROTID WITH PATCH ANGIOPLASTY;  Surgeon: Elam Dutch, MD;  Location: [place];  Service: Vascular;  Laterality: Right;  . EYE SURGERY Bilateral    cataract  . UPPER GI ENDOSCOPY    . vaginal cyst removal  2009    Family History  Problem Relation Age of Onset  . Cancer Mother   . Hypertension Mother   . Heart disease Mother   . Hyperlipidemia Mother   . Heart attack Father   . Hypertension Father   .  Diabetes Brother   . Diabetes Brother   . Heart disease Brother   . Hyperlipidemia Brother   . Hypertension Brother   . Varicose Veins Brother   . Hypertension Sister     Social history:  reports that  has never smoked. she has never used smokeless tobacco. She reports that she does not drink alcohol or use drugs.  Medications:  Prior to Admission medications   Medication Sig Start Date End Date Taking? Authorizing Provider  aspirin EC 81 MG tablet Take 81 mg by mouth daily.   Yes [provider]  carbidopa-levodopa (SINEMET IR) 25-100 MG tablet Take 1 tablet by mouth 4 (four) times daily. 05/18/17  Yes Kathrynn Ducking, MD  cholecalciferol (VITAMIN D) 1000 UNITS tablet Take  1,000 Units by mouth daily.   Yes [provider]  clopidogrel (PLAVIX) 75 MG tablet Take 1 tablet (75 mg total) by mouth daily. 05/25/17  Yes Henreitta Leber, MD  conjugated estrogens (PREMARIN) vaginal cream Place 1 Applicatorful vaginally daily as needed.    Yes [provider]  donepezil (ARICEPT) 5 MG tablet Take 1 tablet (5 mg total) by mouth at bedtime. 06/10/17  Yes Kathrynn Ducking, MD  ferrous gluconate (FERGON) 324 MG tablet Take 324 mg by mouth daily with breakfast.   Yes [provider]  HYDROcodone-acetaminophen (NORCO/VICODIN) 5-325 MG tablet Take 1 tablet every 6 (six) hours as needed by mouth. 04/26/17  Yes Kathrynn Ducking, MD  NIFEdipine (PROCARDIA-XL/ADALAT-CC/NIFEDICAL-XL) 30 MG 24 hr tablet Take 30 mg by mouth daily.   Yes [provider]  ondansetron (ZOFRAN ODT) 4 MG disintegrating tablet Take 1 tablet (4 mg total) by mouth every 8 (eight) hours as needed for nausea or vomiting. 12/18/16  Yes Alfred Levins, [place], MD  pramipexole (MIRAPEX) 1 MG tablet Take 1 mg by mouth 3 (three) times daily.   Yes [provider]  RaNITidine HCl (RANITIDINE 75 PO) Take 1 tablet as needed by mouth.   Yes [provider]     No Known Allergies  ROS:  Out of a complete 14 system review of symptoms, the patient complains only of the following symptoms, and all other reviewed systems are negative.  Fatigue Heart murmur Shortness of breath Memory loss, confusion, headache, weakness, slurred speech, difficulty swallowing, dizziness, seizure, passing out, tremor Sleepiness  Blood pressure (!) 65/42, pulse 78, height 5' (1.524 m), weight 130 lb (59 kg).   Repeat blood pressure sitting, right arm is 88/50.  Blood pressure, right arm, standing is 80/50.  Physical Exam  General: The patient is alert and cooperative at the time of the examination.  Eyes: Pupils are equal, round, and reactive to light. Discs are flat bilaterally.  Neck:  The neck is supple, no carotid bruits are noted.  Respiratory: The respiratory examination is clear.  Cardiovascular: The cardiovascular examination reveals a regular rate and rhythm, a grade II/VI systolic murmur at the aortic area is noted.  Skin: Extremities are without significant edema.  Neurologic Exam  Mental status: The patient is alert and oriented x 3 at the time of the examination. The patient has apparent normal recent and remote memory, with an apparently normal attention span and concentration ability.  Cranial nerves: Facial symmetry is present. There is good sensation of the face to pinprick and soft touch bilaterally. The strength of the facial muscles and the muscles to head turning and shoulder shrug are normal bilaterally. Speech is hypophonic, not a phasic. Extraocular movements are full,  with exception of some restriction of superior gaze. Visual fields are full. The tongue is midline, and the patient has symmetric elevation of the soft palate. No obvious hearing deficits are noted.  Motor: The motor testing reveals 5 over 5 strength of all 4 extremities. Good symmetric motor tone is noted throughout.  Sensory: Sensory testing is intact to pinprick, soft touch and vibration sensation on all 4 extremities. No evidence of extinction is noted.  Coordination: Cerebellar testing reveals good finger-nose-finger and heel-to-shin bilaterally.  Dyskinesias are seen involving the head and neck.  Gait and station: The patient is able to stand with minimal assistance.  The patient can walk with assistance, gait is slow, she has some difficulty with turns.  There is a slight tendency to lean backwards.  Romberg is negative.  Reflexes: Deep tendon reflexes are symmetric and normal bilaterally.   MRI brain 05/25/17:  IMPRESSION: Negative for acute infarct. Multiple chronic infarcts in the basal ganglia bilaterally. Small cortical infarct left parietal lobe.  * MRI scan images  were reviewed online. I agree with the written report.   2D echo 05/25/17:  Study Conclusions  - Left ventricle: The cavity size was normal. There was moderate   concentric hypertrophy. Systolic function was normal. The   estimated ejection fraction was in the range of 60% to 65%. - Aortic valve: There was mild to moderate stenosis. Valve area   (VTI): 1.84 cm^2. Valve area (Vmax): 1.55 cm^2. Valve area   (Vmean): 1.57 cm^2. - Mitral valve: There was mild regurgitation. Valve area by   continuity equation (using LVOT flow): 3.09 cm^2. - Left atrium: The atrium was mildly dilated. - Right atrium: The atrium was mildly dilated.   EEG 06/08/17:  Impression: This EEG was obtained while awake and asleep and is  abnormal due to diffuse background slowing.  Clinical Correlation: Diffuse slowing present in the recording is  consistent with a generalized brain dysfunction. There were no  electrographic seizures or epileptiform changes identified.    Assessment/Plan:  1.  Parkinson's disease  2.  Dementia  3.  Orthostatic hypotension  The patient appears to be developing episodes of orthostasis following meals and dosing with Sinemet.  The Sinemet will be converted to a Sinemet CR tablet taken to 25/100 mg tablets 4 times daily.  The patient will be given a prescription for compression stockings.  She will force fluids, take in more salt, drink Gatorade in the morning.  The Procardia will be stopped in the morning, she will get a 10 mg dose in the evening.  The patient will follow-up in 2 months.  The Vimpat will be tapered, she will go to 50 mg daily for a week and then stop, Keppra will be continued for now but likely will be stopped in the future.  An episode of altered mental status was recorded by the daughter, I reviewed the event, the patient does not appear to be having a seizure, she does have dyskinesias during the event but she is not responding well, likely secondary to  orthostasis.  Jill Alexanders MD 06/17/2017 2:26 PM  [facility] [address] [place], [place] [address]  Phone [phone] Fax [phone]

## 2017-06-23 ENCOUNTER — Telehealth: Payer: Self-pay | Admitting: Neurology

## 2017-06-23 NOTE — Telephone Encounter (Signed)
Please see other note documenting telephone call to her son.

## 2017-06-23 NOTE — Telephone Encounter (Signed)
Pt son(on DPR) has called to discuss his concerns re: pt's NIFEdipine (PROCARDIA) 10 MG capsule having an adjustment to it and how pt is reacting.  Pt son states pt blood presser has been all over the place and has complained of a head ache at 3:00pm it was 140/80,1st reading this morning @ 7:30 was 196/97 while laying down, at 8:15 it was 184/103 while laying down, at 10:10 in sitting position it was 152/75, then at 1200 noon  while still sitting position it was 103/57.  Pt will see GP @ 9:30, please call son

## 2017-06-23 NOTE — Telephone Encounter (Signed)
I spoke to her son [name].  Mrs. Suder saw you last week and her nifedipine was reduced from 10 mg twice a day to 10 mg once a day. However, she is now having very elevated blood pressure readings when she is laying down. I advised him to place her back on 10 mg twice a day.    Consider pyridostigmine for the orthostatic hypotension.   She has an appointment to see her primary care doctor tomorrow

## 2017-06-30 ENCOUNTER — Telehealth: Payer: Self-pay | Admitting: Diagnostic Neuroimaging

## 2017-06-30 NOTE — Telephone Encounter (Signed)
Pts son [name] is calling wanting to know if pt should keep taking Keppra 500MG  twice day and Atorvastatin 80MG  once a day please call at  [phone] to discuss more.

## 2017-07-01 NOTE — Telephone Encounter (Signed)
Dr Willis- please advise 

## 2017-07-01 NOTE — Telephone Encounter (Signed)
I called the son.  The patient is off of Vimpat currently.  She remains on Keppra 500 mg twice daily, we will reduce the dose to 250 mg twice daily for 2 weeks and then stop.  The patient is having headaches at night while lying down, her blood pressures may go to the 350 systolic range.  The will put blocks under the head of the bed to elevate the head 4-6 inches to see if this helps.  The patient is getting nifedipine 10 mg twice daily.

## 2017-07-04 NOTE — Progress Notes (Addendum)
Hematology/Oncology Consult note [facility] Telephone:([phone] [phone]   Patient Care Team: Katheren Shams as PCP - General Birder Robson, MD as Referring Physician (Ophthalmology)  REFERRING PROVIDER: Dr.Lateef CHIEF COMPLAINTS/PURPOSE OF CONSULTATION:  Evaluation of anemia.   HISTORY OF PRESENTING ILLNESS:  Brianna Coleman is a  77 y.o.  female with PMH listed below who was referred to me for evaluation of anemia. Patient is accompanied by his son who provides most of the history. Patient was recently hospitalized to ICU at [facility] due to subarachnoid hemorrhage after a fall, seizures symptoms. She had a chronic history of anemia and chronic kidney disease and was noticed that her anemia has gotten worse. She was referred to see me for additional workup. Patient reports feeling tired and fatigued. Denies any shortness of breath, chest pain, abdominal pain. She denies any bleeding events or black stools. Patient's son also noticed that her right lower extremity has swelled up. Patient was initially discharged home and advised to use a compression stocking. Patient's son cut off the foot portion and only put in a sleeve on her mother's legs. Since then he has noticed right lower extremity swelling.    Review of Systems  Constitutional: Positive for malaise/fatigue. Negative for chills and fever.  HENT: Negative for hearing loss.   Eyes: Negative for blurred vision.  Respiratory: Negative for cough.   Cardiovascular: Negative for chest pain.  Gastrointestinal: Negative for nausea and vomiting.  Genitourinary: Negative for dysuria.  Musculoskeletal: Negative for myalgias.       Right lower extremity swelling.  Skin: Negative for rash.  Neurological: Positive for weakness. Negative for dizziness.       Seizure, loss of balance.  Endo/Heme/Allergies: Does not bruise/bleed easily.  Psychiatric/Behavioral: Negative for depression.     MEDICAL HISTORY:  Past Medical History:  Diagnosis Date  . Anemia   . Aortic stenosis    mild AS by 03/20/13 echo [facility])  . Arthritis, degenerative   . Blood in stool   . Cancer ([place])    skin cancer  . 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 Chronic renal insufficiency    CKD stage IV, sees Dr Holley Raring in [place]  . Dyslipidemia   . Dysphagia   . Episode of syncope    Near-syncope  . Fall   . Gait disorder   . Gastroesophageal reflux disease   . Heart murmur   . History of blood transfusion   . Hypertension   . Memory disturbance   . Neurogenic bladder   . Orthostatic hypotension 06/17/2017  . Parkinson disease ([place])   . 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 RLS (restless legs syndrome)   . Shortness of breath    with exertion  . Stroke [facility])    no residual  . 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 CAROTID ENDARTERECTOMY    . CATARACT EXTRACTION, BILATERAL    . COLONOSCOPY    . ENDARTERECTOMY Right 09/19/2013   Procedure: ENDARTERECTOMY CAROTID WITH PATCH ANGIOPLASTY;  Surgeon: Elam Dutch, MD;  Location: [place];  Service: Vascular;  Laterality: Right;  . EYE SURGERY Bilateral    cataract  . UPPER GI ENDOSCOPY    . vaginal cyst removal  2009    SOCIAL HISTORY: Social History   Socioeconomic History  . Marital status: Widowed    Spouse name: Not on file  .  Number of children: 2  . Years of education: hs  . Highest education level: Not on file  Social Needs  . Financial resource strain: Not on file  . Food insecurity - worry: Not on file  . Food insecurity - inability: Not on file  . Transportation needs - medical: Not on file  . Transportation needs - non-medical: Not on file  Occupational History  . Occupation: Retired    Fish farm manager: RETIRED  Tobacco Use  . Smoking status: Never Smoker  . Smokeless tobacco: Never  Used  Substance and Sexual Activity  . Alcohol use: No  . Drug use: No  . Sexual activity: Not on file  Other Topics Concern  . Not on file  Social History Narrative   Patient drinks caffeine occasionally.   Patient is right handed.    FAMILY HISTORY: Family History  Problem Relation Age of Onset  . Cancer Mother   . Hypertension Mother   . Heart disease Mother   . Hyperlipidemia Mother   . Heart attack Father   . Hypertension Father   . Diabetes Brother   . Diabetes Brother   . Heart disease Brother   . Hyperlipidemia Brother   . Hypertension Brother   . Varicose Veins Brother   . Hypertension Sister     ALLERGIES:  has No Known Allergies.  MEDICATIONS:  Current Outpatient Medications  Medication Sig Dispense Refill  . aspirin EC 81 MG tablet Take 81 mg by mouth daily.    Marland Kitchen atorvastatin (LIPITOR) 40 MG tablet Take 80 mg by mouth daily.   2  . Carbidopa-Levodopa ER (SINEMET CR) 25-100 MG tablet controlled release Take 1 tablet by mouth 4 (four) times daily. 120 tablet 5  . cholecalciferol (VITAMIN D) 1000 UNITS tablet Take 1,000 Units by mouth daily.    . clopidogrel (PLAVIX) 75 MG tablet Take 1 tablet (75 mg total) by mouth daily. 30 tablet 1  . conjugated estrogens (PREMARIN) vaginal cream Place 1 Applicatorful vaginally daily as needed.     . donepezil (ARICEPT) 5 MG tablet Take 1 tablet (5 mg total) by mouth at bedtime. 90 tablet 0  . ferrous gluconate (FERGON) 324 MG tablet Take 324 mg by mouth daily with breakfast.    . HYDROcodone-acetaminophen (NORCO/VICODIN) 5-325 MG tablet Take 1 tablet every 6 (six) hours as needed by mouth. 90 tablet 0  . levETIRAcetam (KEPPRA) 250 MG tablet Take 250 mg by mouth 2 (two) times daily.    Marland Kitchen NIFEdipine (PROCARDIA) 10 MG capsule Take 10 mg by mouth 2 (two) times daily.     . pramipexole (MIRAPEX) 1 MG tablet Take 1 mg by mouth 3 (three) times daily.    . ranitidine (ZANTAC) 150 MG tablet Take 150 mg by mouth daily.  11   No  current facility-administered medications for this visit.      PHYSICAL EXAMINATION: ECOG PERFORMANCE STATUS: 1 - Symptomatic but completely ambulatory Vitals:   07/05/17 1419  BP: (!) 149/71  Pulse: 76  Temp: (!) 96.9 F (36.1 C)   Filed Weights   07/05/17 1419  Weight: 130 lb 8 oz (59.2 kg)    Physical Exam  Constitutional: No distress.  HENT:  Head: Normocephalic and atraumatic.  Eyes: EOM are normal. Pupils are equal, round, and reactive to light. No scleral icterus.  Neck: Normal range of motion. Neck supple.  Cardiovascular: Normal rate and regular rhythm.  Murmur heard. Pulmonary/Chest: Effort normal and breath sounds normal. No respiratory distress. She has  no rales.  Abdominal: Soft. Bowel sounds are normal. There is no tenderness.  Musculoskeletal: Normal range of motion.  Right lower extremity swelling with calf tenderness.  Lymphadenopathy:    She has no cervical adenopathy.     LABORATORY DATA:  I have reviewed the data as listed Lab Results  Component Value Date   WBC 2.6 (L) 05/24/2017   HGB 10.0 (L) 05/24/2017   HCT 31.0 (L) 05/24/2017   MCV 93.8 05/24/2017   PLT 171 05/24/2017   Recent Labs    12/18/16 0803 05/24/17 1459  NA 144 139  K 4.4 4.6  CL 110 108  CO2 22 22  GLUCOSE 176* 108*  BUN 53* 53*  CREATININE 2.45* 2.88*  CALCIUM 9.6 9.2  GFRNONAA 18* 15*  GFRAA 21* 17*  PROT 6.9 6.0*  ALBUMIN 3.9 3.5  AST 26 15  ALT 8* <5*  ALKPHOS 83 53  BILITOT 0.7 0.7  BILIDIR  --  0.1  IBILI  --  0.6       ASSESSMENT & PLAN:  1. 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 Suspected deep vein thrombosis   3. Anemia in stage 4 chronic kidney disease ([place])   4. Heart murmur    #Anemia: multifactorial with possible causes, most likely secondary to chronic kidney disease. Other etiologies including chronic blood loss, hyper/hypothyroidism, nutritional deficiency, infection/chronic inflammation, hemolysis, underlying bone marrow  disorders. Will check CBC w differential, CMP, vitamin B12, Folate, iron/TIBC, ferritin, UA, fecal occult, blood smear,  monoclonal gammopathy evaluation.    # Right lower extremity edema with calf tenderness. Sent to stat ultrasound to rule out DVT.  All questions were answered. The patient knows to call the clinic with any problems questions or concerns.  Return of visit: in 10 days to discuss about lab results.  Thank you for this kind referral and the opportunity to participate in the care of this patient. A copy of today's note is routed to referring provider    Earlie Server, MD, PhD Hematology Oncology [facility] at [facility] Pager- [phone] 07/04/2017

## 2017-07-05 ENCOUNTER — Ambulatory Visit
Admission: RE | Admit: 2017-07-05 | Discharge: 2017-07-05 | Disposition: A | Discharge status: Home / Self Care | Payer: Medicare Other | Source: Ambulatory Visit | Attending: Oncology | Admitting: Oncology

## 2017-07-05 ENCOUNTER — Other Ambulatory Visit: Payer: Self-pay

## 2017-07-05 ENCOUNTER — Other Ambulatory Visit: Payer: Self-pay | Admitting: Oncology

## 2017-07-05 ENCOUNTER — Inpatient Hospital Stay: Payer: Medicare Other | Attending: Oncology | Admitting: Oncology

## 2017-07-05 ENCOUNTER — Telehealth: Payer: Self-pay | Admitting: Oncology

## 2017-07-05 ENCOUNTER — Encounter: Payer: Self-pay | Admitting: Oncology

## 2017-07-05 ENCOUNTER — Inpatient Hospital Stay: Payer: Medicare Other

## 2017-07-05 VITALS — BP 149/71 | HR 76 | Temp 96.9°F | Wt 130.5 lb

## 2017-07-05 DIAGNOSIS — R600 Localized edema: Secondary | ICD-10-CM | POA: Diagnosis not present

## 2017-07-05 DIAGNOSIS — R011 Cardiac murmur, unspecified: Secondary | ICD-10-CM

## 2017-07-05 DIAGNOSIS — D72810 Lymphocytopenia: Secondary | ICD-10-CM | POA: Insufficient documentation

## 2017-07-05 DIAGNOSIS — R0989 Other specified symptoms and signs involving the circulatory and respiratory systems: Secondary | ICD-10-CM

## 2017-07-05 DIAGNOSIS — R5383 Other fatigue: Secondary | ICD-10-CM | POA: Diagnosis not present

## 2017-07-05 DIAGNOSIS — D649 Anemia, unspecified: Secondary | ICD-10-CM

## 2017-07-05 DIAGNOSIS — N184 Chronic kidney disease, stage 4 (severe): Secondary | ICD-10-CM

## 2017-07-05 DIAGNOSIS — D631 Anemia in chronic kidney disease: Secondary | ICD-10-CM

## 2017-07-05 LAB — CBC WITH DIFFERENTIAL/PLATELET
Basophils Absolute: 0 10*3/uL (ref 0–0.1)
Basophils Relative: 1 %
EOS ABS: 0 10*3/uL (ref 0–0.7)
EOS PCT: 1 %
HCT: 32.4 % — ABNORMAL LOW (ref 35.0–47.0)
HEMOGLOBIN: 10.5 g/dL — AB (ref 12.0–16.0)
LYMPHS ABS: 0.9 10*3/uL — AB (ref 1.0–3.6)
Lymphocytes Relative: 26 %
MCH: 30.4 pg (ref 26.0–34.0)
MCHC: 32.4 g/dL (ref 32.0–36.0)
MCV: 93.7 fL (ref 80.0–100.0)
Monocytes Absolute: 0.3 10*3/uL (ref 0.2–0.9)
Monocytes Relative: 9 %
NEUTROS PCT: 63 %
Neutro Abs: 2.4 10*3/uL (ref 1.4–6.5)
Platelets: 195 10*3/uL (ref 150–440)
RBC: 3.45 MIL/uL — AB (ref 3.80–5.20)
RDW: 14.9 % — ABNORMAL HIGH (ref 11.5–14.5)
WBC: 3.7 10*3/uL (ref 3.6–11.0)

## 2017-07-05 LAB — URINALYSIS, COMPLETE (UACMP) WITH MICROSCOPIC
Bacteria, UA: NONE SEEN
Bilirubin Urine: NEGATIVE
GLUCOSE, UA: NEGATIVE mg/dL
HGB URINE DIPSTICK: NEGATIVE
Ketones, ur: NEGATIVE mg/dL
Nitrite: NEGATIVE
PH: 6 (ref 5.0–8.0)
PROTEIN: NEGATIVE mg/dL
Specific Gravity, Urine: 1.004 — ABNORMAL LOW (ref 1.005–1.030)

## 2017-07-05 LAB — FERRITIN: Ferritin: 74 ng/mL (ref 11–307)

## 2017-07-05 LAB — VITAMIN B12: VITAMIN B 12: 327 pg/mL (ref 180–914)

## 2017-07-05 LAB — FOLATE: Folate: 12.1 ng/mL (ref >5.9)

## 2017-07-05 LAB — IRON AND TIBC
IRON: 46 ug/dL (ref 28–170)
Saturation Ratios: 18 % (ref 10.4–31.8)
TIBC: 259 ug/dL (ref 250–450)
UIBC: 213 ug/dL

## 2017-07-05 NOTE — Progress Notes (Signed)
Patient here today as a new patient with anemia  

## 2017-07-06 LAB — KAPPA/LAMBDA LIGHT CHAINS
Kappa free light chain: 79 mg/L — ABNORMAL HIGH (ref 3.3–19.4)
Kappa, lambda light chain ratio: 3.5 — ABNORMAL HIGH (ref 0.26–1.65)
Lambda free light chains: 22.6 mg/L (ref 5.7–26.3)

## 2017-07-07 LAB — PROTEIN ELECTROPHORESIS, SERUM
A/G Ratio: 1.2 (ref 0.7–1.7)
Albumin ELP: 3.5 g/dL (ref 2.9–4.4)
Alpha-1-Globulin: 0.3 g/dL (ref 0.0–0.4)
Alpha-2-Globulin: 0.7 g/dL (ref 0.4–1.0)
BETA GLOBULIN: 0.9 g/dL (ref 0.7–1.3)
GAMMA GLOBULIN: 1.1 g/dL (ref 0.4–1.8)
Globulin, Total: 3 g/dL (ref 2.2–3.9)
TOTAL PROTEIN ELP: 6.5 g/dL (ref 6.0–8.5)

## 2017-07-08 DIAGNOSIS — N184 Chronic kidney disease, stage 4 (severe): Secondary | ICD-10-CM | POA: Diagnosis not present

## 2017-07-10 DIAGNOSIS — N184 Chronic kidney disease, stage 4 (severe): Secondary | ICD-10-CM | POA: Diagnosis not present

## 2017-07-11 DIAGNOSIS — N184 Chronic kidney disease, stage 4 (severe): Secondary | ICD-10-CM | POA: Diagnosis not present

## 2017-07-12 ENCOUNTER — Other Ambulatory Visit: Payer: Self-pay | Admitting: *Deleted

## 2017-07-12 DIAGNOSIS — D631 Anemia in chronic kidney disease: Secondary | ICD-10-CM

## 2017-07-12 DIAGNOSIS — N184 Chronic kidney disease, stage 4 (severe): Principal | ICD-10-CM

## 2017-07-12 LAB — OCCULT BLOOD X 1 CARD TO LAB, STOOL
Fecal Occult Bld: NEGATIVE
Fecal Occult Bld: NEGATIVE
Fecal Occult Bld: NEGATIVE

## 2017-07-14 NOTE — Progress Notes (Addendum)
Hematology/Oncology follow-up note [facility] [phone] [phone]   Patient Care Team: Katheren Shams as PCP - General Birder Robson, MD as Referring Physician (Ophthalmology)  REFERRING PROVIDER: Dr.Lateef CHIEF COMPLAINTS/PURPOSE OF CONSULTATION:  Evaluation of anemia.   HISTORY OF PRESENTING ILLNESS:  Brianna Coleman is a  77 y.o.  female with PMH listed below who was referred to me for evaluation of anemia. Patient is accompanied by his son who provides most of the history. Patient was recently hospitalized to ICU at [facility] due to subarachnoid hemorrhage after a fall, seizures symptoms. She had a chronic history of anemia and chronic kidney disease and was noticed that her anemia has gotten worse. She was referred to see me for additional workup. Patient reports feeling tired and fatigued. Denies any shortness of breath, chest pain, abdominal pain. She denies any bleeding events or black stools. Patient's son also noticed that her right lower extremity has swelled up. Patient was initially discharged home and advised to use a compression stocking. Patient's son cut off the foot portion and only put in a sleeve on her mother's legs. Since then he has noticed right lower extremity swelling.   INTERVAL HISTORY Brianna Coleman is a 77 y.o. female who has above history reviewed by me today presents for follow up visit for management of anemia. Patient was accompanied by her son who reports that recently patient appears to have a stomach flu, she nauseated and vomited and also had a diarrhea.  All her symptoms has improved by now.  Today patient feels at baseline, without new concerns.  She continued to feel fatigued.   Review of Systems  Constitutional: Positive for malaise/fatigue. Negative for chills and fever.  HENT: Negative for hearing loss.   Eyes: Negative for blurred vision and photophobia.  Respiratory: Negative for cough.     Cardiovascular: Negative for chest pain.  Gastrointestinal: Positive for diarrhea, nausea and vomiting.  Genitourinary: Negative for dysuria.  Musculoskeletal: Negative for myalgias and neck pain.       Right lower extremity swelling.  Skin: Negative for rash.  Neurological: Positive for weakness. Negative for dizziness and tremors.       No additional seizure episodes during the interval.  Endo/Heme/Allergies: Does not bruise/bleed easily.  Psychiatric/Behavioral: Negative for depression and substance abuse.    MEDICAL HISTORY:  Past Medical History:  Diagnosis Date  . Anemia   . Aortic stenosis    mild AS by 03/20/13 echo [facility])  . Arthritis, degenerative   . Blood in stool   . Cancer ([place])    skin cancer  . 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 Chronic renal insufficiency    CKD stage IV, sees Dr Holley Raring in [place]  . Dyslipidemia   . Dysphagia   . Episode of syncope    Near-syncope  . Fall   . Gait disorder   . Gastroesophageal reflux disease   . Heart murmur   . History of blood transfusion   . Hypertension   . Memory disturbance   . Neurogenic bladder   . Orthostatic hypotension 06/17/2017  . Parkinson disease ([place])   . 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 RLS (restless legs syndrome)   . Shortness of breath    with exertion  . Stroke [facility])    no residual  . 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  CAROTID ENDARTERECTOMY    . CATARACT EXTRACTION, BILATERAL    . COLONOSCOPY    . ENDARTERECTOMY Right 09/19/2013   Procedure: ENDARTERECTOMY CAROTID WITH PATCH ANGIOPLASTY;  Surgeon: Elam Dutch, MD;  Location: [place];  Service: Vascular;  Laterality: Right;  . EYE SURGERY Bilateral    cataract  . UPPER GI ENDOSCOPY    . vaginal cyst removal  2009    SOCIAL HISTORY: Social History   Socioeconomic History  . Marital  status: Widowed    Spouse name: Not on file  . Number of children: 2  . Years of education: hs  . Highest education level: Not on file  Social Needs  . Financial resource strain: Not on file  . Food insecurity - worry: Not on file  . Food insecurity - inability: Not on file  . Transportation needs - medical: Not on file  . Transportation needs - non-medical: Not on file  Occupational History  . Occupation: Retired    Fish farm manager: RETIRED  Tobacco Use  . Smoking status: Never Smoker  . Smokeless tobacco: Never Used  Substance and Sexual Activity  . Alcohol use: No  . Drug use: No  . Sexual activity: Not on file  Other Topics Concern  . Not on file  Social History Narrative   Patient drinks caffeine occasionally.   Patient is right handed.    FAMILY HISTORY: Family History  Problem Relation Age of Onset  . Hypertension Mother   . Heart disease Mother   . Hyperlipidemia Mother   . Breast cancer Mother   . Heart attack Father   . Hypertension Father   . Colon cancer Father   . Lung cancer Father   . Diabetes Brother   . Diabetes Brother   . Heart disease Brother   . Hyperlipidemia Brother   . Hypertension Brother   . Varicose Veins Brother   . Deep vein thrombosis Brother   . Hypertension Sister   . Stomach cancer Maternal Grandmother     ALLERGIES:  has No Known Allergies.  MEDICATIONS:  Current Outpatient Medications  Medication Sig Dispense Refill  . aspirin EC 81 MG tablet Take 81 mg by mouth daily.    Marland Kitchen atorvastatin (LIPITOR) 40 MG tablet Take 80 mg by mouth daily.   2  . Carbidopa-Levodopa ER (SINEMET CR) 25-100 MG tablet controlled release Take 1 tablet by mouth 4 (four) times daily. 120 tablet 5  . cholecalciferol (VITAMIN D) 1000 UNITS tablet Take 1,000 Units by mouth daily.    . clopidogrel (PLAVIX) 75 MG tablet Take 1 tablet (75 mg total) by mouth daily. 30 tablet 1  . conjugated estrogens (PREMARIN) vaginal cream Place 1 Applicatorful vaginally daily  as needed.     . donepezil (ARICEPT) 5 MG tablet Take 1 tablet (5 mg total) by mouth at bedtime. 90 tablet 0  . ferrous gluconate (FERGON) 324 MG tablet Take 1 tablet (324 mg total) by mouth 2 (two) times daily with a meal. 60 tablet 3  . HYDROcodone-acetaminophen (NORCO/VICODIN) 5-325 MG tablet Take 1 tablet every 6 (six) hours as needed by mouth. 90 tablet 0  . NIFEdipine (PROCARDIA) 10 MG capsule Take 10 mg by mouth 2 (two) times daily.     . pramipexole (MIRAPEX) 1 MG tablet Take 1 mg by mouth 3 (three) times daily.    . ranitidine (ZANTAC) 150 MG tablet Take 150 mg by mouth daily.  11  . docusate sodium (COLACE) 100 MG capsule Take 1 capsule (  100 mg total) by mouth daily. Do not take if you have loose stools 30 capsule 3  . folic acid (FOLVITE) 409 MCG tablet Take 1 tablet (400 mcg total) by mouth daily. 30 tablet 3   No current facility-administered medications for this visit.      PHYSICAL EXAMINATION: ECOG PERFORMANCE STATUS: 2 - Symptomatic, <50% confined to bed Vitals:   07/15/17 1421  BP: 120/70  Pulse: 69  Temp: 98 F (36.7 C)   Filed Weights   07/15/17 1421  Weight: 128 lb 4 oz (58.2 kg)    Physical Exam  Constitutional: She is oriented to person, place, and time. No distress.  HENT:  Head: Normocephalic and atraumatic.  Mouth/Throat: Oropharynx is clear and moist. No oropharyngeal exudate.  Eyes: EOM are normal. Pupils are equal, round, and reactive to light. No scleral icterus.  Neck: Normal range of motion. Neck supple. No tracheal deviation present.  Cardiovascular: Normal rate and regular rhythm.  Murmur heard. Pulmonary/Chest: Effort normal and breath sounds normal. No respiratory distress. She has no rales.  Abdominal: Soft. Bowel sounds are normal. There is no tenderness. There is no rebound.  Musculoskeletal: Normal range of motion.  Right lower extremity swelling with calf tenderness.  Lymphadenopathy:    She has no cervical adenopathy.  Neurological:  She is alert and oriented to person, place, and time. No cranial nerve deficit.  Skin: Skin is dry. No erythema.     LABORATORY DATA:  I have reviewed the data as listed Lab Results  Component Value Date   WBC 6.0 07/15/2017   HGB 8.9 (L) 07/15/2017   HCT 27.2 (L) 07/15/2017   MCV 93.5 07/15/2017   PLT 165 07/15/2017   Recent Labs    12/18/16 0803 05/24/17 1459  NA 144 139  K 4.4 4.6  CL 110 108  CO2 22 22  GLUCOSE 176* 108*  BUN 53* 53*  CREATININE 2.45* 2.88*  CALCIUM 9.6 9.2  GFRNONAA 18* 15*  GFRAA 21* 17*  PROT 6.9 6.0*  ALBUMIN 3.9 3.5  AST 26 15  ALT 8* <5*  ALKPHOS 83 53  BILITOT 0.7 0.7  BILIDIR  --  0.1  IBILI  --  0.6   Negative stool occult, normal folate and B12 levels.  Slightly elevated free light chain ratio at 3 likely secondary to chronic kidney disease.  No monoclonal band on protein electro pheresis. Ferritin 74.    ASSESSMENT & PLAN:  1. Anemia due to stage 4 chronic kidney disease ([place])   2. Heart murmur   3. Lymphopenia    #Anemia: CBC showed hemoglobin 10.5, lymphocytopenia.  We will add flow cytometry.  I discussed with patient that her hemoglobin is at target between 10-11.  And I will hold Procrit for now. Advised patient to increase ferrous sulfate 324mg  twice daily, add folic acid 0.4 mg daily. #Lymphocytopenia, will check hepatitis and flow cytometry.  I ordered CBC with differential, iron panel to be done in 3 months when patient returns.  Accidentally  these labs were released by lab today and done with her today's lab.  Surprisingly patient appears to have a decline of hemoglobin to 8.9 compared to 10 days ago. This can be secondary to acute bone marrow suppression due to recent virus infection Advised patient to repeat CBC testing 2 weeks. If hemoglobin persistently below, will start patient on IV iron infusion and once ferritin is above 300, will start with Procrit injection.  All questions were answered. The patient knows  to  call the clinic with any problems questions or concerns.  Return of visit: Labs in 2 weeks.  Tentatively follow-up in 3 months with repeat labs. Thank you for this kind referral and the opportunity to participate in the care of this patient. A copy of today's note is routed to referring provider    Earlie Server, MD, PhD Hematology Oncology [facility] at [facility] Pager- [phone] 07/15/2017

## 2017-07-15 ENCOUNTER — Encounter: Payer: Self-pay | Admitting: Oncology

## 2017-07-15 ENCOUNTER — Inpatient Hospital Stay (HOSPITAL_BASED_OUTPATIENT_CLINIC_OR_DEPARTMENT_OTHER): Payer: Medicare Other | Admitting: Oncology

## 2017-07-15 ENCOUNTER — Other Ambulatory Visit: Payer: Self-pay

## 2017-07-15 ENCOUNTER — Inpatient Hospital Stay: Payer: Medicare Other

## 2017-07-15 VITALS — BP 120/70 | HR 69 | Temp 98.0°F | Wt 128.2 lb

## 2017-07-15 DIAGNOSIS — D631 Anemia in chronic kidney disease: Secondary | ICD-10-CM

## 2017-07-15 DIAGNOSIS — R011 Cardiac murmur, unspecified: Secondary | ICD-10-CM

## 2017-07-15 DIAGNOSIS — R600 Localized edema: Secondary | ICD-10-CM

## 2017-07-15 DIAGNOSIS — R5383 Other fatigue: Secondary | ICD-10-CM

## 2017-07-15 DIAGNOSIS — N184 Chronic kidney disease, stage 4 (severe): Secondary | ICD-10-CM

## 2017-07-15 DIAGNOSIS — D72810 Lymphocytopenia: Secondary | ICD-10-CM

## 2017-07-15 LAB — CBC WITH DIFFERENTIAL/PLATELET
BASOS PCT: 1 %
Basophils Absolute: 0 10*3/uL (ref 0–0.1)
Eosinophils Absolute: 0 10*3/uL (ref 0–0.7)
Eosinophils Relative: 1 %
HCT: 27.2 % — ABNORMAL LOW (ref 35.0–47.0)
HEMOGLOBIN: 8.9 g/dL — AB (ref 12.0–16.0)
LYMPHS ABS: 0.9 10*3/uL — AB (ref 1.0–3.6)
Lymphocytes Relative: 15 %
MCH: 30.7 pg (ref 26.0–34.0)
MCHC: 32.8 g/dL (ref 32.0–36.0)
MCV: 93.5 fL (ref 80.0–100.0)
MONOS PCT: 6 %
Monocytes Absolute: 0.4 10*3/uL (ref 0.2–0.9)
NEUTROS ABS: 4.6 10*3/uL (ref 1.4–6.5)
NEUTROS PCT: 77 %
Platelets: 165 10*3/uL (ref 150–440)
RBC: 2.91 MIL/uL — ABNORMAL LOW (ref 3.80–5.20)
RDW: 15.2 % — ABNORMAL HIGH (ref 11.5–14.5)
WBC: 6 10*3/uL (ref 3.6–11.0)

## 2017-07-15 LAB — IRON AND TIBC
IRON: 40 ug/dL (ref 28–170)
Saturation Ratios: 19 % (ref 10.4–31.8)
TIBC: 215 ug/dL — AB (ref 250–450)
UIBC: 175 ug/dL

## 2017-07-15 LAB — FERRITIN: FERRITIN: 75 ng/mL (ref 11–307)

## 2017-07-15 MED ORDER — DOCUSATE SODIUM 100 MG PO CAPS: 100.0000 mg | ORAL_CAPSULE | Freq: Every day | ORAL | 3 refills | Status: DC

## 2017-07-15 MED ORDER — FOLIC ACID 400 MCG PO TABS: 400.0000 ug | ORAL_TABLET | Freq: Every day | ORAL | 3 refills | Status: DC

## 2017-07-15 MED ORDER — FERROUS GLUCONATE 324 (38 FE) MG PO TABS: 324.0000 mg | ORAL_TABLET | Freq: Two times a day (BID) | ORAL | 3 refills | Status: DC

## 2017-07-15 NOTE — Progress Notes (Signed)
Patient here today for follow up.   

## 2017-07-16 LAB — HEPATITIS PANEL, ACUTE
HCV Ab: <0.1 s/co ratio (ref 0.0–0.9)
Hep A IgM: NEGATIVE
Hep B C IgM: NEGATIVE
Hepatitis B Surface Ag: NEGATIVE

## 2017-07-19 LAB — COMP PANEL: LEUKEMIA/LYMPHOMA: IMMUNOPHENOTYPIC PROFILE: 0

## 2017-07-28 ENCOUNTER — Inpatient Hospital Stay: Payer: Medicare Other | Attending: Oncology

## 2017-07-28 DIAGNOSIS — N184 Chronic kidney disease, stage 4 (severe): Secondary | ICD-10-CM | POA: Insufficient documentation

## 2017-07-28 DIAGNOSIS — D72820 Lymphocytosis (symptomatic): Secondary | ICD-10-CM | POA: Diagnosis not present

## 2017-07-28 DIAGNOSIS — D72810 Lymphocytopenia: Secondary | ICD-10-CM | POA: Diagnosis not present

## 2017-07-28 DIAGNOSIS — D631 Anemia in chronic kidney disease: Secondary | ICD-10-CM | POA: Insufficient documentation

## 2017-07-28 DIAGNOSIS — R011 Cardiac murmur, unspecified: Secondary | ICD-10-CM | POA: Diagnosis not present

## 2017-07-28 LAB — CBC WITH DIFFERENTIAL/PLATELET
BASOS ABS: 0 10*3/uL (ref 0–0.1)
Basophils Relative: 1 %
EOS PCT: 1 %
Eosinophils Absolute: 0 10*3/uL (ref 0–0.7)
HCT: 28.5 % — ABNORMAL LOW (ref 35.0–47.0)
Hemoglobin: 9.4 g/dL — ABNORMAL LOW (ref 12.0–16.0)
Lymphocytes Relative: 17 %
Lymphs Abs: 0.7 10*3/uL — ABNORMAL LOW (ref 1.0–3.6)
MCH: 30.7 pg (ref 26.0–34.0)
MCHC: 33 g/dL (ref 32.0–36.0)
MCV: 93.2 fL (ref 80.0–100.0)
MONO ABS: 0.3 10*3/uL (ref 0.2–0.9)
Monocytes Relative: 8 %
Neutro Abs: 3.2 10*3/uL (ref 1.4–6.5)
Neutrophils Relative %: 73 %
PLATELETS: 177 10*3/uL (ref 150–440)
RBC: 3.06 MIL/uL — AB (ref 3.80–5.20)
RDW: 14.8 % — AB (ref 11.5–14.5)
WBC: 4.3 10*3/uL (ref 3.6–11.0)

## 2017-07-30 ENCOUNTER — Telehealth: Payer: Self-pay | Admitting: *Deleted

## 2017-07-30 ENCOUNTER — Other Ambulatory Visit: Payer: Self-pay | Admitting: Oncology

## 2017-07-30 DIAGNOSIS — D649 Anemia, unspecified: Secondary | ICD-10-CM | POA: Insufficient documentation

## 2017-07-30 DIAGNOSIS — D509 Iron deficiency anemia, unspecified: Secondary | ICD-10-CM

## 2017-07-30 NOTE — Telephone Encounter (Signed)
Called son to notify him of appt. Change to 2/25 at 10:15 for lab, MD possible venofer.

## 2017-07-30 NOTE — Progress Notes (Signed)
bc

## 2017-08-01 ENCOUNTER — Other Ambulatory Visit: Payer: Self-pay | Admitting: Neurology

## 2017-08-08 ENCOUNTER — Other Ambulatory Visit: Payer: Self-pay | Admitting: Neurology

## 2017-08-09 ENCOUNTER — Other Ambulatory Visit: Payer: Self-pay | Admitting: Neurology

## 2017-08-10 ENCOUNTER — Telehealth: Payer: Self-pay | Admitting: Neurology

## 2017-08-10 ENCOUNTER — Other Ambulatory Visit: Payer: Self-pay | Admitting: Neurology

## 2017-08-10 MED ORDER — DONEPEZIL HCL 5 MG PO TABS: 5.0000 mg | ORAL_TABLET | Freq: Every day | ORAL | 0 refills | Status: DC

## 2017-08-10 NOTE — Telephone Encounter (Signed)
Patient's son calling. He states donepezil (ARICEPT) 5 MG tablet was not filled on 06-10-17 and patient only has 3 pills left. He says Rx was last filled on 03-14-17. Please call and discuss. Patient uses [facility] in [place].

## 2017-08-10 NOTE — Addendum Note (Signed)
Addended by: Hope Pigeon on: 08/10/2017 03:50 PM   Modules accepted: Orders

## 2017-08-10 NOTE — Telephone Encounter (Signed)
Called son back. Advised we re-sent refills to pharmacy for them. He verbalized understanding and appreciation for call.

## 2017-08-15 NOTE — Progress Notes (Signed)
Hematology/Oncology follow-up note [facility] Telephone:([phone] [phone]   Patient Care Team: Katheren Shams as PCP - General Birder Robson, MD as Referring Physician (Ophthalmology)  REFERRING PROVIDER: Dr.Lateef REASON FOR VISIT Follow up for treatment of anemia.   HISTORY OF PRESENTING ILLNESS:  [facility] is a  77 y.o.  female with PMH listed below who was referred to me for evaluation of anemia. Patient is accompanied by his son who provides most of the history. Patient was recently hospitalized to ICU at [facility] due to subarachnoid hemorrhage after a fall, seizures symptoms. She had a chronic history of anemia and chronic kidney disease and was noticed that her anemia has gotten worse. She was referred to see me for additional workup. Patient reports feeling tired and fatigued. Denies any shortness of breath, chest pain, abdominal pain. She denies any bleeding events or black stools. Patient's son also noticed that her right lower extremity has swelled up. Patient was initially discharged home and advised to use a compression stocking. Patient's son cut off the foot portion and only put in a sleeve on her mother's legs. Since then he has noticed right lower extremity swelling.   INTERVAL HISTORY [facility] is a 77 y.o. female who has above history reviewed by me today presents for follow up visit for management of anemia. Patient was accompanied by her daughter. Patient has had worsened anemia lately and likely due to acute stomach flu infection. She is here for a repeat lab test and reassessment to see if hemoglobin recovers. She feels better today, chronic fatigue level is at baseline. Denies chest pain, nausea vomiting, diarrhea.   Review of Systems  Constitutional: Positive for malaise/fatigue. Negative for chills and fever.  HENT: Negative for hearing loss.   Eyes: Negative for blurred vision and photophobia.    Respiratory: Negative for cough, hemoptysis and sputum production.   Cardiovascular: Negative for chest pain and orthopnea.  Gastrointestinal: Negative for diarrhea, nausea and vomiting.  Genitourinary: Negative for dysuria and frequency.  Musculoskeletal: Negative for myalgias and neck pain.  Skin: Negative for rash.  Neurological: Negative for dizziness, tremors and weakness.  Endo/Heme/Allergies: Negative for environmental allergies. Does not bruise/bleed easily.  Psychiatric/Behavioral: Negative for depression and substance abuse.    MEDICAL HISTORY:  Past Medical History:  Diagnosis Date  . Anemia   . Aortic stenosis    mild AS by 03/20/13 echo [facility])  . Arthritis, degenerative   . Blood in stool   . Cancer ([place])    skin cancer  . 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 Chronic renal insufficiency    CKD stage IV, sees Dr Holley Raring in [place]  . Dyslipidemia   . Dysphagia   . Episode of syncope    Near-syncope  . Fall   . Gait disorder   . Gastroesophageal reflux disease   . Heart murmur   . History of blood transfusion   . Hypertension   . Memory disturbance   . Neurogenic bladder   . Orthostatic hypotension 06/17/2017  . Parkinson disease ([place])   . 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 RLS (restless legs syndrome)   . Shortness of breath    with exertion  . Stroke [facility])    no residual  . Stroke due to embolism of right carotid artery ([place]) 09/06/2015    SURGICAL HISTORY: Past Surgical History:  Procedure Laterality Date  . CAROTID ENDARTERECTOMY    . CATARACT EXTRACTION, BILATERAL    .  COLONOSCOPY    . ENDARTERECTOMY Right 09/19/2013   Procedure: ENDARTERECTOMY CAROTID WITH PATCH ANGIOPLASTY;  Surgeon: Elam Dutch, MD;  Location: [facility];  Service: Vascular;  Laterality: Right;  . EYE SURGERY Bilateral    cataract  . UPPER GI ENDOSCOPY    . vaginal cyst removal  2009    SOCIAL HISTORY: Social History    Socioeconomic History  . Marital status: Widowed    Spouse name: Not on file  . Number of children: 2  . Years of education: hs  . Highest education level: Not on file  Social Needs  . Financial resource strain: Not on file  . Food insecurity - worry: Not on file  . Food insecurity - inability: Not on file  . Transportation needs - medical: Not on file  . Transportation needs - non-medical: Not on file  Occupational History  . Occupation: Retired    Fish farm manager: RETIRED  Tobacco Use  . Smoking status: Never Smoker  . Smokeless tobacco: Never Used  Substance and Sexual Activity  . Alcohol use: No  . Drug use: No  . Sexual activity: Not on file  Other Topics Concern  . Not on file  Social History Narrative   Patient drinks caffeine occasionally.   Patient is right handed.    FAMILY HISTORY: Family History  Problem Relation Age of Onset  . Hypertension Mother   . Heart disease Mother   . Hyperlipidemia Mother   . Breast cancer Mother   . Heart attack Father   . Hypertension Father   . Colon cancer Father   . Lung cancer Father   . Diabetes Brother   . Diabetes Brother   . Heart disease Brother   . Hyperlipidemia Brother   . Hypertension Brother   . Varicose Veins Brother   . Deep vein thrombosis Brother   . Hypertension Sister   . Stomach cancer Maternal Grandmother     ALLERGIES:  has No Known Allergies.  MEDICATIONS:  Current Outpatient Medications  Medication Sig Dispense Refill  . aspirin EC 81 MG tablet Take 81 mg by mouth daily.    Marland Kitchen atorvastatin (LIPITOR) 40 MG tablet Take 80 mg by mouth daily.   2  . Carbidopa-Levodopa ER (SINEMET CR) 25-100 MG tablet controlled release Take 1 tablet by mouth 4 (four) times daily. 120 tablet 5  . cholecalciferol (VITAMIN D) 1000 UNITS tablet Take 1,000 Units by mouth daily.    . clopidogrel (PLAVIX) 75 MG tablet Take 1 tablet (75 mg total) by mouth daily. 30 tablet 1  . conjugated estrogens (PREMARIN) vaginal cream  Place 1 Applicatorful vaginally daily as needed.     . docusate sodium (COLACE) 100 MG capsule Take 1 capsule (100 mg total) by mouth daily. Do not take if you have loose stools 30 capsule 3  . donepezil (ARICEPT) 5 MG tablet Take 1 tablet (5 mg total) by mouth at bedtime. 90 tablet 0  . ferrous gluconate (FERGON) 324 MG tablet Take 1 tablet (324 mg total) by mouth 2 (two) times daily with a meal. 60 tablet 3  . folic acid (FOLVITE) 831 MCG tablet Take 1 tablet (400 mcg total) by mouth daily. 30 tablet 3  . HYDROcodone-acetaminophen (NORCO/VICODIN) 5-325 MG tablet Take 1 tablet every 6 (six) hours as needed by mouth. 90 tablet 0  . NIFEdipine (PROCARDIA) 10 MG capsule Take 10 mg by mouth 2 (two) times daily.     . pramipexole (MIRAPEX) 1 MG tablet Take 1  mg by mouth 3 (three) times daily.    . pramipexole (MIRAPEX) 1 MG tablet TAKE 1 TABLET BY MOUTH 3 TIMES A DAY 270 tablet 3  . ranitidine (ZANTAC) 150 MG tablet Take 150 mg by mouth daily.  11   No current facility-administered medications for this visit.      PHYSICAL EXAMINATION: ECOG PERFORMANCE STATUS: 2 - Symptomatic, <50% confined to bed Vitals:   08/16/17 1036  BP: 125/78  Pulse: 80  Resp: 20  Temp: 98.5 F (36.9 C)  SpO2: 98%   Filed Weights   08/16/17 1036  Weight: 129 lb 6.4 oz (58.7 kg)    Physical Exam  Constitutional: She is oriented to person, place, and time. No distress.  HENT:  Head: Normocephalic and atraumatic.  Mouth/Throat: Oropharynx is clear and moist. No oropharyngeal exudate.  Eyes: EOM are normal. Pupils are equal, round, and reactive to light. No scleral icterus.  Neck: Normal range of motion. Neck supple. No tracheal deviation present.  Cardiovascular: Normal rate and regular rhythm.  Murmur heard. Loud systolic murmur  Pulmonary/Chest: Effort normal and breath sounds normal. No respiratory distress. She has no rales.  Abdominal: Soft. Bowel sounds are normal. There is no tenderness. There is no  rebound.  Musculoskeletal: Normal range of motion.  Lymphadenopathy:    She has no cervical adenopathy.  Neurological: She is alert and oriented to person, place, and time. No cranial nerve deficit.  Skin: Skin is dry. No erythema.     LABORATORY DATA:  I have reviewed the data as listed Lab Results  Component Value Date   WBC 4.6 08/16/2017   HGB 10.0 (L) 08/16/2017   HCT 30.4 (L) 08/16/2017   MCV 93.3 08/16/2017   PLT 172 08/16/2017   Recent Labs    12/18/16 0803 05/24/17 1459  NA 144 139  K 4.4 4.6  CL 110 108  CO2 22 22  GLUCOSE 176* 108*  BUN 53* 53*  CREATININE 2.45* 2.88*  CALCIUM 9.6 9.2  GFRNONAA 18* 15*  GFRAA 21* 17*  PROT 6.9 6.0*  ALBUMIN 3.9 3.5  AST 26 15  ALT 8* <5*  ALKPHOS 83 53  BILITOT 0.7 0.7  BILIDIR  --  0.1  IBILI  --  0.6   Negative stool occult, normal folate and B12 levels.  Slightly elevated free light chain ratio at 3 likely secondary to chronic kidney disease.  No monoclonal band on protein electro pheresis. Ferritin 74.    ASSESSMENT & PLAN:  1. Anemia due to stage 4 chronic kidney disease ([place])   2. Heart murmur   3. Monoclonal B-cell lymphocytosis    #Anemia: CBC showed hemoglobin 10, recovered, likely previous worsening of anemia is due to acute infection. Currently at baseline, hold procrit.  # Iron panel reviewed, consistent with anemia of chronic disease.  Advised patient to increase ferrous sulfate 324mg  twice daily, continue  folic acid 0.4 mg daily.  #Lymphocytopenia, flowcytometry showed <1%,CD5- CD10- clonal B cell population, likely monoclonal B cell lymphocytosis, continue to monitor.   All questions were answered. The patient knows to call the clinic with any problems questions or concerns.  Return of visit: 3 months with repeat labs. Thank you for this kind referral and the opportunity to participate in the care of this patient. A copy of today's note is routed to referring provider    Earlie Server, MD,  PhD Hematology Oncology [facility] Pager- [phone] 08/15/2017

## 2017-08-16 ENCOUNTER — Inpatient Hospital Stay: Payer: Medicare Other

## 2017-08-16 ENCOUNTER — Encounter: Payer: Self-pay | Admitting: Oncology

## 2017-08-16 ENCOUNTER — Inpatient Hospital Stay (HOSPITAL_BASED_OUTPATIENT_CLINIC_OR_DEPARTMENT_OTHER): Payer: Medicare Other | Admitting: Oncology

## 2017-08-16 VITALS — BP 125/78 | HR 80 | Temp 98.5°F | Resp 20 | Ht 61.25 in | Wt 129.4 lb

## 2017-08-16 DIAGNOSIS — D72820 Lymphocytosis (symptomatic): Secondary | ICD-10-CM | POA: Diagnosis not present

## 2017-08-16 DIAGNOSIS — D72810 Lymphocytopenia: Secondary | ICD-10-CM | POA: Diagnosis not present

## 2017-08-16 DIAGNOSIS — N184 Chronic kidney disease, stage 4 (severe): Secondary | ICD-10-CM | POA: Diagnosis not present

## 2017-08-16 DIAGNOSIS — R011 Cardiac murmur, unspecified: Secondary | ICD-10-CM

## 2017-08-16 DIAGNOSIS — D631 Anemia in chronic kidney disease: Secondary | ICD-10-CM

## 2017-08-16 DIAGNOSIS — D509 Iron deficiency anemia, unspecified: Secondary | ICD-10-CM

## 2017-08-16 LAB — CBC WITH DIFFERENTIAL/PLATELET
Basophils Absolute: 0 10*3/uL (ref 0–0.1)
Basophils Relative: 1 %
EOS PCT: 1 %
Eosinophils Absolute: 0 10*3/uL (ref 0–0.7)
HEMATOCRIT: 30.4 % — AB (ref 35.0–47.0)
Hemoglobin: 10 g/dL — ABNORMAL LOW (ref 12.0–16.0)
LYMPHS ABS: 0.7 10*3/uL — AB (ref 1.0–3.6)
Lymphocytes Relative: 15 %
MCH: 30.7 pg (ref 26.0–34.0)
MCHC: 32.9 g/dL (ref 32.0–36.0)
MCV: 93.3 fL (ref 80.0–100.0)
MONO ABS: 0.4 10*3/uL (ref 0.2–0.9)
MONOS PCT: 8 %
NEUTROS ABS: 3.4 10*3/uL (ref 1.4–6.5)
Neutrophils Relative %: 75 %
PLATELETS: 172 10*3/uL (ref 150–440)
RBC: 3.25 MIL/uL — ABNORMAL LOW (ref 3.80–5.20)
RDW: 14.8 % — AB (ref 11.5–14.5)
WBC: 4.6 10*3/uL (ref 3.6–11.0)

## 2017-08-16 NOTE — Progress Notes (Signed)
No new changes noted today 

## 2017-08-18 ENCOUNTER — Ambulatory Visit: Payer: Medicare Other | Admitting: Neurology

## 2017-08-18 ENCOUNTER — Encounter: Payer: Self-pay | Admitting: Neurology

## 2017-08-18 VITALS — BP 138/72 | HR 81 | Ht 61.25 in | Wt 130.5 lb

## 2017-08-18 DIAGNOSIS — G20 Parkinson's disease: Secondary | ICD-10-CM | POA: Diagnosis not present

## 2017-08-18 DIAGNOSIS — I951 Orthostatic hypotension: Secondary | ICD-10-CM | POA: Diagnosis not present

## 2017-08-18 LAB — COMP PANEL: LEUKEMIA/LYMPHOMA: Immunophenotypic Profile: 0

## 2017-08-18 MED ORDER — QUETIAPINE FUMARATE 25 MG PO TABS: 12.5000 mg | ORAL_TABLET | Freq: Every day | ORAL | 3 refills | Status: DC

## 2017-08-18 NOTE — Patient Instructions (Signed)
   We will start Seroquel 25 mg, take 1/2 tablet at night.

## 2017-08-18 NOTE — Progress Notes (Signed)
Reason for visit: Parkinson's disease, memory disorder  B[facility]anna Coleman is an 77 y.o. female  History of present illness:  B[facility]anna Coleman is a 77 year old right-handed white female with a history of Parkinson's disease.  The patient has had some issues with orthostatic hypotension.  She was placed on Keppra and Vimpat for presumed seizures, she has come off of these medications and she has done well.  She is not having significant issues with orthostasis at this time, she has not had any blackouts.  The patient has had some problems with hallucinations however that are upsetting to her.  She becomes frightened seeing a man around the house, she has also felt that the house was burned down and she got upset with this.  The hallucinations tend to occur in the afternoon or evening.  The patient has not had any recent falls, she will have occasional choking events.  She does not sleep well every night.  The patient returns to this office for an evaluation.  Past Medical History:  Diagnosis Date  . Anemia   . Aortic stenosis    mild AS by 03/20/13 echo [facility])  . Arthritis, degenerative   . Blood in stool   . Cancer ([place])    skin cancer  . 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 Chronic renal insufficiency    CKD stage IV, sees Dr Holley Raring in [place]  . Dyslipidemia   . Dysphagia   . Episode of syncope    Near-syncope  . Fall   . Gait disorder   . Gastroesophageal reflux disease   . Heart murmur   . History of blood transfusion   . Hypertension   . Memory disturbance   . Neurogenic bladder   . Orthostatic hypotension 06/17/2017  . Parkinson disease ([place] [facility])   . 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 RLS (restless legs syndrome)   . Shortness of breath    with exertion  . Stroke [facility])    no residual  . 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 CAROTID ENDARTERECTOMY    .  CATARACT EXTRACTION, BILATERAL    . COLONOSCOPY    . ENDARTERECTOMY Right 09/19/2013   Procedure: ENDARTERECTOMY CAROTID WITH PATCH ANGIOPLASTY;  Surgeon: Elam Dutch, MD;  Location: [facility];  Service: Vascular;  Laterality: Right;  . EYE SURGERY Bilateral    cataract  . UPPER GI ENDOSCOPY    . vaginal cyst removal  2009    Family History  Problem Relation Age of Onset  . Hypertension Mother   . Heart disease Mother   . Hyperlipidemia Mother   . Breast cancer Mother   . Heart attack Father   . Hypertension Father   . Colon cancer Father   . Lung cancer Father   . Diabetes Brother   . Diabetes Brother   . Heart disease Brother   . Hyperlipidemia Brother   . Hypertension Brother   . Varicose Veins Brother   . Deep vein thrombosis Brother   . Hypertension Sister   . Stomach cancer Maternal Grandmother     Social history:  reports that  has never smoked. she has never used smokeless tobacco. She reports that she does not drink alcohol or use drugs.   No Known Allergies  Medications:  Prior to Admission medications   Medication Sig Start Date End Date Taking? Authorizing Provider  aspirin  EC 81 MG tablet Take 81 mg by mouth daily.   Yes [provider]  atorvastatin (LIPITOR) 40 MG tablet Take 80 mg by mouth daily.  07/01/17  Yes [provider]  Carbidopa-Levodopa ER (SINEMET CR) 25-100 MG tablet controlled release Take 1 tablet by mouth 4 (four) times daily. 06/17/17  Yes Kathrynn Ducking, MD  cholecalciferol (VITAMIN D) 1000 UNITS tablet Take 1,000 Units by mouth daily.   Yes [provider]  clopidogrel (PLAVIX) 75 MG tablet Take 1 tablet (75 mg total) by mouth daily. 05/25/17  Yes Henreitta Leber, MD  conjugated estrogens (PREMARIN) vaginal cream Place 1 Applicatorful vaginally daily as needed.    Yes [provider]  docusate sodium (COLACE) 100 MG capsule Take 1 capsule (100 mg total) by mouth daily. Do not take if you have loose  stools 07/15/17  Yes Earlie Server, MD  donepezil (ARICEPT) 5 MG tablet Take 1 tablet (5 mg total) by mouth at bedtime. 08/10/17  Yes Kathrynn Ducking, MD  ferrous gluconate (FERGON) 324 MG tablet Take 1 tablet (324 mg total) by mouth 2 (two) times daily with a meal. 07/15/17  Yes Earlie Server, MD  folic acid (FOLVITE) 397 MCG tablet Take 1 tablet (400 mcg total) by mouth daily. 07/15/17  Yes Earlie Server, MD  HYDROcodone-acetaminophen (NORCO/VICODIN) 5-325 MG tablet Take 1 tablet every 6 (six) hours as needed by mouth. 04/26/17  Yes Kathrynn Ducking, MD  NIFEdipine (PROCARDIA) 10 MG capsule Take 10 mg by mouth 2 (two) times daily.    Yes [provider]  pramipexole (MIRAPEX) 1 MG tablet Take 1 mg by mouth 3 (three) times daily.   Yes [provider]  pramipexole (MIRAPEX) 1 MG tablet TAKE 1 TABLET BY MOUTH 3 TIMES A DAY 08/02/17  Yes Kathrynn Ducking, MD  ranitidine (ZANTAC) 150 MG tablet Take 150 mg by mouth daily. 06/29/17  Yes [provider]    ROS:  Out of a complete 14 system review of symptoms, the patient complains only of the following symptoms, and all other reviewed systems are negative.  Fatigue Runny nose, difficulty swallowing, drooling Eye discharge, eye itching, eye redness, blurred vision Wheezing, shortness of breath, choking Leg swelling Cold intolerance Abdominal pain, nausea, vomiting Insomnia, frequent waking, snoring Incontinence of the bladder, urinary urgency Muscle cramps, walking difficulty Bruising easily Memory loss, dizziness, headache, weakness Confusion, decreased concentration, anxiety, hallucinations  Blood pressure 138/72, pulse 81, height 5' 1.25" (1.556 m), weight 130 lb 8 oz (59.2 kg).   Blood pressure, right arm, sitting is 148/78.  Blood pressure, right arm, standing is 120/60.  Physical Exam  General: The patient is alert and cooperative at the time of the examination.  Skin: No significant peripheral edema is  noted.   Neurologic Exam  Mental status: The patient is alert and oriented x 3 at the time of the examination. The patient has apparent normal recent and remote memory, with an apparently normal attention span and concentration ability.   Cranial nerves: Facial symmetry is present. Speech is normal, no aphasia or dysarthria is noted. Extraocular movements are full. Visual fields are full.  Masking of the space is seen.  Motor: The patient has good strength in all 4 extremities.  Sensory examination: Soft touch sensation is symmetric on the face, arms, and legs.  Coordination: The patient has good finger-nose-finger bilaterally.  The patient has apraxia with heel-to-shin on both sides.  Gait and station: The patient is able to  walk independently.  Gait is somewhat unsteady.  Tandem gait was not attempted.  Romberg is negative.  Reflexes: Deep tendon reflexes are symmetric.   Assessment/Plan:  1.  Parkinson's disease  2.  Memory disorder  3.  Hallucinations  4.  Gait disorder  5.  Orthostatic hypotension  The patient is doing better with her orthostatic hypotension.  She is having some problems with hallucinations however that are upsetting to her.  I would initiate low-dose Seroquel taking 12.5 mg in the evening, the family is to contact me for dose adjustments.  We will continue the current dose of Mirapex and Sinemet.  She is to remain on Aricept.  She will follow-up in 5 months.  Jill Alexanders MD 08/18/2017 2:09 PM  [facility] [address] [place], [place] [address]  Phone [phone] Fax [phone]

## 2017-08-19 ENCOUNTER — Ambulatory Visit: Payer: Medicare Other | Admitting: Neurology

## 2017-08-26 ENCOUNTER — Telehealth: Payer: Self-pay | Admitting: Neurology

## 2017-08-26 NOTE — Telephone Encounter (Signed)
I called the son.  The patient has just started the Seroquel in low-dose.  She is having episodes of crying, the rest the time she seems to be happy and doing well.  I would not stop the Seroquel quite yet, if the mood changes are more pervasive, we may need to stop the Seroquel, the Seroquel can worsen depression.  We may have to go to Nuplazid in the future.

## 2017-08-26 NOTE — Telephone Encounter (Signed)
Pt son(on DPR) pt is on medication QUEtiapine (SEROQUEL) 25 MG tablet , pt taking 12.5mg  a day, son feels is causing sever depression.  He is asking for a call to discuss

## 2017-09-07 ENCOUNTER — Other Ambulatory Visit: Payer: Self-pay | Admitting: Neurology

## 2017-09-27 ENCOUNTER — Ambulatory Visit: Payer: Medicare Other | Admitting: Neurology

## 2017-09-27 ENCOUNTER — Encounter: Payer: Self-pay | Admitting: Neurology

## 2017-09-27 VITALS — BP 107/54 | HR 80 | Wt 124.6 lb

## 2017-09-27 DIAGNOSIS — G20 Parkinson's disease: Secondary | ICD-10-CM | POA: Diagnosis not present

## 2017-09-27 DIAGNOSIS — R269 Unspecified abnormalities of gait and mobility: Secondary | ICD-10-CM

## 2017-09-27 DIAGNOSIS — I951 Orthostatic hypotension: Secondary | ICD-10-CM | POA: Diagnosis not present

## 2017-09-27 NOTE — Progress Notes (Signed)
Reason for visit: Parkinson's disease  Brianna Coleman is an 77 y.o. female  History of present illness:  Brianna Coleman is a 77 year old right-handed white female with a history of Parkinson's disease associated with dementia.  The patient has a gait disorder, orthostatic hypotension, and she has problems with hallucinations.  The patient was started on Seroquel in low-dose taking one half of a 25 mg tablet in the evenings which has helped her episodes of hallucinations.  The patient still sees people about the house but they are not threatening to her.  The patient did have an episode of syncope that occurred after an episode of nausea and vomiting, likely associated with dehydration.  The patient does have orthostatic hypotension and she does report dizziness frequently when she stands up.  The patient uses a walker inside the house.  She does have episodes of freezing on occasion.  The patient is tolerating her medications fairly well.  She is getting out and doing something on a daily basis which helps her underlying depression.  The patient is trying to stay as active as possible.  She returns for an evaluation.  Past Medical History:  Diagnosis Date  . Anemia   . Aortic stenosis    mild AS by 03/20/13 echo [facility])  . Arthritis, degenerative   . Blood in stool   . Cancer ([place])    skin cancer  . 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 Chronic renal insufficiency    CKD stage IV, sees Dr Holley Raring in [place]  . Dyslipidemia   . Dysphagia   . Episode of syncope    Near-syncope  . Fall   . Gait disorder   . Gastroesophageal reflux disease   . Heart murmur   . History of blood transfusion   . Hypertension   . Memory disturbance   . Neurogenic bladder   . Orthostatic hypotension 06/17/2017  . Parkinson disease ([place])   . 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 RLS (restless legs syndrome)   . Shortness of breath    with exertion  . Stroke [facility])    no residual  . 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 CAROTID ENDARTERECTOMY    . CATARACT EXTRACTION, BILATERAL    . COLONOSCOPY    . ENDARTERECTOMY Right 09/19/2013   Procedure: ENDARTERECTOMY CAROTID WITH PATCH ANGIOPLASTY;  Surgeon: Elam Dutch, MD;  Location: [place];  Service: Vascular;  Laterality: Right;  . EYE SURGERY Bilateral    cataract  . UPPER GI ENDOSCOPY    . vaginal cyst removal  2009    Family History  Problem Relation Age of Onset  . Hypertension Mother   . Heart disease Mother   . Hyperlipidemia Mother   . Breast cancer Mother   . Heart attack Father   . Hypertension Father   . Colon cancer Father   . Lung cancer Father   . Diabetes Brother   . Diabetes Brother   . Heart disease Brother   . Hyperlipidemia Brother   . Hypertension Brother   . Varicose Veins Brother   . Deep vein thrombosis Brother   . Hypertension Sister   . Stomach cancer Maternal Grandmother     Social history:  reports that she has never smoked. She has never used smokeless tobacco. She reports that she does not drink alcohol or use drugs.   No  Known Allergies  Medications:  Prior to Admission medications   Medication Sig Start Date End Date Taking? Authorizing Provider  aspirin EC 81 MG tablet Take 81 mg by mouth daily.   Yes [provider]  atorvastatin (LIPITOR) 40 MG tablet Take 80 mg by mouth daily.  07/01/17  Yes [provider]  Carbidopa-Levodopa ER (SINEMET CR) 25-100 MG tablet controlled release Take 1 tablet by mouth 4 (four) times daily. 06/17/17  Yes Kathrynn Ducking, MD  cholecalciferol (VITAMIN D) 1000 UNITS tablet Take 1,000 Units by mouth daily.   Yes [provider]  clopidogrel (PLAVIX) 75 MG tablet Take 1 tablet (75 mg total) by mouth daily. 05/25/17  Yes Henreitta Leber, MD  conjugated estrogens (PREMARIN) vaginal cream Place 1 Applicatorful  vaginally daily as needed.    Yes [provider]  docusate sodium (COLACE) 100 MG capsule Take 1 capsule (100 mg total) by mouth daily. Do not take if you have loose stools 07/15/17  Yes Earlie Server, MD  donepezil (ARICEPT) 5 MG tablet Take 1 tablet (5 mg total) by mouth at bedtime. 08/10/17  Yes Kathrynn Ducking, MD  ferrous gluconate (FERGON) 324 MG tablet Take 1 tablet (324 mg total) by mouth 2 (two) times daily with a meal. 07/15/17  Yes Earlie Server, MD  folic acid (FOLVITE) 854 MCG tablet Take 1 tablet (400 mcg total) by mouth daily. 07/15/17  Yes Earlie Server, MD  HYDROcodone-acetaminophen (NORCO/VICODIN) 5-325 MG tablet Take 1 tablet every 6 (six) hours as needed by mouth. 04/26/17  Yes Kathrynn Ducking, MD  NIFEdipine (PROCARDIA-XL/ADALAT CC) 30 MG 24 hr tablet Take 30 mg by mouth daily. 09/07/17  Yes [provider]  pramipexole (MIRAPEX) 1 MG tablet Take 1 mg by mouth 3 (three) times daily.   Yes [provider]  pramipexole (MIRAPEX) 1 MG tablet TAKE 1 TABLET BY MOUTH 3 TIMES A DAY 08/02/17  Yes Kathrynn Ducking, MD  QUEtiapine (SEROQUEL) 25 MG tablet Take 0.5 tablets (12.5 mg total) by mouth at bedtime. 08/18/17  Yes Kathrynn Ducking, MD  ranitidine (ZANTAC) 150 MG tablet Take 150 mg by mouth daily. 06/29/17  Yes [provider]    ROS:  Out of a complete 14 system review of symptoms, the patient complains only of the following symptoms, and all other reviewed systems are negative.  Fatigue Runny nose, difficulty swallowing, drooling Eye discharge, eye itching Wheezing, shortness of breath Leg swelling, heart murmur Cold intolerance, flushing Nausea, vomiting, incontinence of the bowels Snoring Incontinence of the bladder Back pain, walking difficulty Anemia Memory loss, dizziness, numbness, speech problems, weakness, passing out Confusion, hallucinations  Blood pressure (!) 107/54, pulse 80, weight 124 lb 9.6 oz (56.5 kg).  Physical Exam  General:  The patient is alert and cooperative at the time of the examination.  Skin: No significant peripheral edema is noted.   Neurologic Exam  Mental status: The patient is alert and oriented x 3 at the time of the examination. The Mini-Mental status examination done today shows a total score of 13/30.   Cranial nerves: Facial symmetry is present. Speech is normal, no aphasia or dysarthria is noted. Extraocular movements are full. Visual fields are full.  Masking of the face is seen.  Motor: The patient has good strength in all 4 extremities.  Sensory examination: Soft touch sensation is symmetric on the face, arms, and legs.  Coordination: The patient has good finger-nose-finger and heel-to-shin bilaterally.  Apraxia with use of  the extremities is noted.  Gait and station: The patient is able to stand with some assistance.  The patient is able to ambulate independently, but her balance is unsteady.  She has a stooped posture, shuffling steps are noted.  She has difficulty with turns.  Decreased arm swing is seen.  Reflexes: Deep tendon reflexes are symmetric.   Assessment/Plan:  1.  Parkinson's disease  2.  Dementia  3.  Gait disorder  4.  Orthostatic hypotension  5.  Hallucinations  The patient will continue her current medications including low-dose Seroquel, Sinemet, Mirapex, and Aricept.  The patient is trying to remain active, I have encouraged her to do this.  The patient will follow-up in 5 months.  They will call for any concerns.  Jill Alexanders MD 09/27/2017 12:46 PM  [facility] [address] [place], [place] [address]  Phone [phone] Fax [phone]

## 2017-10-11 ENCOUNTER — Other Ambulatory Visit: Payer: Medicare Other

## 2017-10-14 ENCOUNTER — Ambulatory Visit: Payer: Medicare Other | Admitting: Oncology

## 2017-10-25 ENCOUNTER — Telehealth: Payer: Self-pay | Admitting: Neurology

## 2017-10-25 MED ORDER — DONEPEZIL HCL 5 MG PO TABS: 5.0000 mg | ORAL_TABLET | Freq: Every day | ORAL | 1 refills | Status: AC

## 2017-10-25 NOTE — Telephone Encounter (Signed)
Rx refill sent to pharmacy. 

## 2017-10-25 NOTE — Telephone Encounter (Signed)
Pt's son request refill for donepezil (ARICEPT) 5 MG tablet sent to [facility]

## 2017-10-26 ENCOUNTER — Other Ambulatory Visit: Payer: Self-pay

## 2017-10-26 ENCOUNTER — Emergency Department
Admission: EM | Admit: 2017-10-26 | Discharge: 2017-10-26 | Disposition: A | Discharge status: Home / Self Care | Payer: Medicare Other | Attending: Emergency Medicine | Admitting: Emergency Medicine

## 2017-10-26 ENCOUNTER — Emergency Department: Payer: Medicare Other

## 2017-10-26 DIAGNOSIS — N184 Chronic kidney disease, stage 4 (severe): Secondary | ICD-10-CM | POA: Diagnosis not present

## 2017-10-26 DIAGNOSIS — M79674 Pain in right toe(s): Secondary | ICD-10-CM | POA: Diagnosis present

## 2017-10-26 DIAGNOSIS — G20 Parkinson's disease: Secondary | ICD-10-CM | POA: Diagnosis not present

## 2017-10-26 DIAGNOSIS — I509 Heart failure, unspecified: Secondary | ICD-10-CM | POA: Insufficient documentation

## 2017-10-26 DIAGNOSIS — Z79899 Other long term (current) drug therapy: Secondary | ICD-10-CM | POA: Diagnosis not present

## 2017-10-26 DIAGNOSIS — I130 Hypertensive heart and chronic kidney disease with heart failure and stage 1 through stage 4 chronic kidney disease, or unspecified chronic kidney disease: Secondary | ICD-10-CM | POA: Diagnosis not present

## 2017-10-26 DIAGNOSIS — Z7982 Long term (current) use of aspirin: Secondary | ICD-10-CM | POA: Diagnosis not present

## 2017-10-26 LAB — COMPREHENSIVE METABOLIC PANEL
ALT: <5 U/L — ABNORMAL LOW (ref 14–54)
AST: 19 U/L (ref 15–41)
Albumin: 3.8 g/dL (ref 3.5–5.0)
Alkaline Phosphatase: 64 U/L (ref 38–126)
Anion gap: 5 (ref 5–15)
BUN: 38 mg/dL — ABNORMAL HIGH (ref 6–20)
CHLORIDE: 109 mmol/L (ref 101–111)
CO2: 27 mmol/L (ref 22–32)
CREATININE: 2.96 mg/dL — AB (ref 0.44–1.00)
Calcium: 9.2 mg/dL (ref 8.9–10.3)
GFR calc Af Amer: 17 mL/min — ABNORMAL LOW (ref >60)
GFR, EST NON AFRICAN AMERICAN: 14 mL/min — AB (ref >60)
Glucose, Bld: 131 mg/dL — ABNORMAL HIGH (ref 65–99)
POTASSIUM: 4.3 mmol/L (ref 3.5–5.1)
SODIUM: 141 mmol/L (ref 135–145)
Total Bilirubin: 0.7 mg/dL (ref 0.3–1.2)
Total Protein: 6.7 g/dL (ref 6.5–8.1)

## 2017-10-26 LAB — CBC
HCT: 30.9 % — ABNORMAL LOW (ref 35.0–47.0)
Hemoglobin: 10.1 g/dL — ABNORMAL LOW (ref 12.0–16.0)
MCH: 30.8 pg (ref 26.0–34.0)
MCHC: 32.8 g/dL (ref 32.0–36.0)
MCV: 94 fL (ref 80.0–100.0)
PLATELETS: 157 10*3/uL (ref 150–440)
RBC: 3.29 MIL/uL — AB (ref 3.80–5.20)
RDW: 15.7 % — AB (ref 11.5–14.5)
WBC: 5.2 10*3/uL (ref 3.6–11.0)

## 2017-10-26 LAB — BRAIN NATRIURETIC PEPTIDE: B NATRIURETIC PEPTIDE 5: 174 pg/mL — AB (ref 0.0–100.0)

## 2017-10-26 NOTE — ED Triage Notes (Signed)
Pt arrives with son Brianna Coleman. Pt with lives son, state R foot pain and swelling x few weeks but worse today. Denies falls. Wants to be checked for blood clot. States R leg was red today. Had negative [place] on R leg few weeks ago from [facility]. Seeing Dr. Tasia Catchings for low RBC. Pt states walking on foot is painful but not bearing weight on it is less painful. Denies hx of DM. Does have CHF. Doesn't take fluid pill. Slight pitting edema noted to R leg. Subsides quickly.   States R big toe pain, no bruising noted. Son states bruising. Pedal pulses present. Thinks hit foot on table yesterda.    Brianna Coleman states pt doesn't have legal guardian. Brianna Coleman states he doesn't have POA but maybe sister who also lives with pt has POA. But denies legal guardian.

## 2017-10-26 NOTE — ED Notes (Signed)
ED Provider at bedside. 

## 2017-10-26 NOTE — Discharge Instructions (Addendum)
Please seek medical attention for any high fevers, chest pain, shortness of breath, change in behavior, persistent vomiting, bloody stool or any other new or concerning symptoms.  

## 2017-10-26 NOTE — ED Provider Notes (Signed)
[facility] Emergency Department Provider Note   ____________________________________________   I have reviewed the triage vital signs and the nursing notes.   HISTORY  Chief Complaint Foot Pain and Leg Swelling   History limited by: Not Limited, history aided by son   HPI Brianna Coleman is a 77 y.o. female who presents to the emergency department today patient with right great toe pain.  Patient has history of swelling in that right leg.  Patient first started complaining of right toe pain today.  Patient's son states that he thinks she might of twisted the toe or head against a ping-pong table.  However they do have some concerns for blood clots given swelling.  Patient denies similar pain in the past.  Denies any other pain in any of her joints.  No recent illnesses.   Per medical record review patient has a history of CKD.  Past Medical History:  Diagnosis Date  . Anemia   . Aortic stenosis    mild AS by 03/20/13 echo [facility])  . Arthritis, degenerative   . Blood in stool   . Cancer ([place])    skin cancer  . 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 Chronic renal insufficiency    CKD stage IV, sees Dr Holley Raring in [place]  . Dyslipidemia   . Dysphagia   . Episode of syncope    Near-syncope  . Fall   . Gait disorder   . Gastroesophageal reflux disease   . Heart murmur   . History of blood transfusion   . Hypertension   . Memory disturbance   . Neurogenic bladder   . Orthostatic hypotension 06/17/2017  . Parkinson disease ([place])   . 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 RLS (restless legs syndrome)   . Shortness of breath    with exertion  . Stroke [facility])    no residual  . 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 Anemia 07/30/2017  . Orthostatic hypotension 06/17/2017  . TIA (transient ischemic attack) 05/24/2017  .  Hallucinations 04/26/2017  . Apraxia 12/21/2016  . Cervicogenic headache 02/05/2016  . 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 Cerebrovascular accident (CVA) ([place]) 08/12/2015  . Transient alteration of awareness 07/31/2015  . 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 Absolute anemia 01/26/2014  . Aortic valve defect 01/26/2014  . Congestive heart failure ([place]) 01/26/2014  . Benign essential HTN 01/26/2014  . HLD (hyperlipidemia) 01/26/2014  . Pure hypercholesterolemia 01/26/2014  . Avitaminosis D 01/26/2014  . Carotid artery stenosis 09/19/2013  . Carotid artery obstruction 09/19/2013  . Occlusion and stenosis of carotid artery without mention of cerebral infarction 09/14/2013  . Paralysis agitans ([place]) 10/04/2012  . Abnormality of gait 10/04/2012  . Parkinson's disease ([place]) 10/04/2012  . Memory loss 06/21/2012  . Dysphagia, unspecified(787.20) 06/21/2012  . Unspecified essential hypertension 06/21/2012  . Other and unspecified hyperlipidemia 06/21/2012  . Unspecified cataract 06/21/2012  . Cataract 06/21/2012  . Can't get food down 06/21/2012  . Essential (primary) hypertension 06/21/2012    Past Surgical History:  Procedure Laterality Date  . CAROTID ENDARTERECTOMY    . CATARACT EXTRACTION, BILATERAL    . COLONOSCOPY    . ENDARTERECTOMY Right 09/19/2013   Procedure: ENDARTERECTOMY CAROTID WITH PATCH ANGIOPLASTY;  Surgeon: Elam Dutch, MD;  Location: [place];  Service: Vascular;  Laterality: Right;  .  EYE SURGERY Bilateral    cataract  . UPPER GI ENDOSCOPY    . vaginal cyst removal  2009    Prior to Admission medications   Medication Sig Start Date End Date Taking? Authorizing Provider  aspirin EC 81 MG tablet Take 81 mg by mouth daily.    [provider]  atorvastatin (LIPITOR) 40 MG tablet Take 80 mg by mouth daily.  07/01/17   [provider]  Carbidopa-Levodopa ER (SINEMET CR) 25-100 MG tablet controlled release Take 1  tablet by mouth 4 (four) times daily. 06/17/17   Kathrynn Ducking, MD  cholecalciferol (VITAMIN D) 1000 UNITS tablet Take 1,000 Units by mouth daily.    [provider]  clopidogrel (PLAVIX) 75 MG tablet Take 1 tablet (75 mg total) by mouth daily. 05/25/17   Henreitta Leber, MD  conjugated estrogens (PREMARIN) vaginal cream Place 1 Applicatorful vaginally daily as needed.     [provider]  docusate sodium (COLACE) 100 MG capsule Take 1 capsule (100 mg total) by mouth daily. Do not take if you have loose stools 07/15/17   Earlie Server, MD  donepezil (ARICEPT) 5 MG tablet Take 1 tablet (5 mg total) by mouth at bedtime. 10/25/17   Kathrynn Ducking, MD  ferrous gluconate (FERGON) 324 MG tablet Take 1 tablet (324 mg total) by mouth 2 (two) times daily with a meal. 07/15/17   Earlie Server, MD  folic acid (FOLVITE) 161 MCG tablet Take 1 tablet (400 mcg total) by mouth daily. 07/15/17   Earlie Server, MD  HYDROcodone-acetaminophen (NORCO/VICODIN) 5-325 MG tablet Take 1 tablet every 6 (six) hours as needed by mouth. 04/26/17   Kathrynn Ducking, MD  NIFEdipine (PROCARDIA-XL/ADALAT CC) 30 MG 24 hr tablet Take 30 mg by mouth daily. 09/07/17   [provider]  pramipexole (MIRAPEX) 1 MG tablet Take 1 mg by mouth 3 (three) times daily.    [provider]  pramipexole (MIRAPEX) 1 MG tablet TAKE 1 TABLET BY MOUTH 3 TIMES A DAY 08/02/17   Kathrynn Ducking, MD  QUEtiapine (SEROQUEL) 25 MG tablet Take 0.5 tablets (12.5 mg total) by mouth at bedtime. 08/18/17   Kathrynn Ducking, MD  ranitidine (ZANTAC) 150 MG tablet Take 150 mg by mouth daily. 06/29/17   [provider]    Allergies Patient has no known allergies.  Family History  Problem Relation Age of Onset  . Hypertension Mother   . Heart disease Mother   . Hyperlipidemia Mother   . Breast cancer Mother   . Heart attack Father   . Hypertension Father   . Colon cancer Father   . Lung cancer Father   . Diabetes Brother   .  Diabetes Brother   . Heart disease Brother   . Hyperlipidemia Brother   . Hypertension Brother   . Varicose Veins Brother   . Deep vein thrombosis Brother   . Hypertension Sister   . Stomach cancer Maternal Grandmother     Social History Social History   Tobacco Use  . Smoking status: Never Smoker  . Smokeless tobacco: Never Used  Substance Use Topics  . Alcohol use: No  . Drug use: No    Review of Systems Constitutional: No fever/chills Eyes: No visual changes. ENT: No sore throat. Cardiovascular: Denies chest pain. Respiratory: Denies shortness of breath. Gastrointestinal: No abdominal pain.  No nausea, no vomiting.  No diarrhea.   Genitourinary: Negative for dysuria. Musculoskeletal: Right toe pain. Skin: Negative for rash.  Neurological: Negative for headaches, focal weakness or numbness.  ____________________________________________   PHYSICAL EXAM:  VITAL SIGNS: ED Triage Vitals  Enc Vitals Group     BP 10/26/17 1406 (!) 126/53     Pulse Rate 10/26/17 1406 75     Resp 10/26/17 1406 16     Temp 10/26/17 1406 98 F (36.7 C)     Temp Source 10/26/17 1406 Oral     SpO2 10/26/17 1406 99 %     Weight 10/26/17 1407 125 lb (56.7 kg)     Height 10/26/17 1407 4\' 11"  (1.499 m)     Head Circumference --      Peak Flow --      Pain Score 10/26/17 1406 5   Constitutional: Alert and oriented. Well appearing and in no distress. Eyes: Conjunctivae are normal.  ENT   Head: Normocephalic and atraumatic.   Nose: No congestion/rhinnorhea.   Mouth/Throat: Mucous membranes are moist.   Neck: No stridor. Hematological/Lymphatic/Immunilogical: No cervical lymphadenopathy. Cardiovascular: Normal rate, regular rhythm.  No murmurs, rubs, or gallops. Respiratory: Normal respiratory effort without tachypnea nor retractions. Breath sounds are clear and equal bilaterally. No wheezes/rales/rhonchi. Gastrointestinal: Soft and non tender. No rebound. No guarding.   Genitourinary: Deferred Musculoskeletal: Normal range of motion in all extremities. Right great toe without erythema or swelling. No warmth. Neurologic:  Normal speech and language. No gross focal neurologic deficits are appreciated.  Skin:  Skin is warm, dry and intact. No rash noted. Psychiatric: Mood and affect are normal. Speech and behavior are normal. Patient exhibits appropriate insight and judgment.  ____________________________________________    LABS (pertinent positives/negatives)  CBC wbc 5.2, hgb 10.1, plt 157 CMP k 4.3, glu 131, cr 2.96 BNP 174  ____________________________________________   EKG  None  ____________________________________________    RADIOLOGY  Right foot x-ray Normal  RLE [place] No dvt  ____________________________________________   PROCEDURES  Procedures  ____________________________________________   INITIAL IMPRESSION / ASSESSMENT AND PLAN / ED COURSE  Pertinent labs & imaging results that were available during my care of the patient were reviewed by me and considered in my medical decision making (see chart for details).  Patient presented to the emergency department today because of concerns for right great toe pain.  X-ray was negative.  Additionally ultrasound was performed that did not show any blood clots.  I doubt gout or septic joint at this time.  This point do think patient possibly could have sprained it. Discussed findings with patient and family.  ____________________________________________   FINAL CLINICAL IMPRESSION(S) / ED DIAGNOSES  Final diagnoses:  Great toe pain, right     Note: This dictation was prepared with Dragon dictation. Any transcriptional errors that result from this process are unintentional      Nance Pear, MD 10/26/17 [phone]

## 2017-10-27 ENCOUNTER — Other Ambulatory Visit: Payer: Self-pay | Admitting: Neurology

## 2017-10-29 ENCOUNTER — Other Ambulatory Visit: Payer: Self-pay | Admitting: Oncology

## 2017-11-03 ENCOUNTER — Other Ambulatory Visit: Payer: Self-pay | Admitting: Oncology

## 2017-11-03 ENCOUNTER — Other Ambulatory Visit: Payer: Self-pay | Admitting: *Deleted

## 2017-11-03 NOTE — Telephone Encounter (Signed)
Has been done already.

## 2017-11-05 ENCOUNTER — Other Ambulatory Visit: Payer: Self-pay

## 2017-11-05 ENCOUNTER — Inpatient Hospital Stay: Payer: Medicare Other | Attending: Oncology

## 2017-11-05 DIAGNOSIS — I129 Hypertensive chronic kidney disease with stage 1 through stage 4 chronic kidney disease, or unspecified chronic kidney disease: Secondary | ICD-10-CM | POA: Diagnosis not present

## 2017-11-05 DIAGNOSIS — D631 Anemia in chronic kidney disease: Secondary | ICD-10-CM | POA: Diagnosis present

## 2017-11-05 DIAGNOSIS — N184 Chronic kidney disease, stage 4 (severe): Principal | ICD-10-CM

## 2017-11-05 LAB — IRON AND TIBC
IRON: 43 ug/dL (ref 28–170)
Saturation Ratios: 20 % (ref 10.4–31.8)
TIBC: 216 ug/dL — ABNORMAL LOW (ref 250–450)
UIBC: 173 ug/dL

## 2017-11-05 LAB — CBC WITH DIFFERENTIAL/PLATELET
Basophils Absolute: 0 10*3/uL (ref 0–0.1)
Basophils Relative: 1 %
EOS PCT: 2 %
Eosinophils Absolute: 0.1 10*3/uL (ref 0–0.7)
HCT: 29.7 % — ABNORMAL LOW (ref 35.0–47.0)
Hemoglobin: 9.9 g/dL — ABNORMAL LOW (ref 12.0–16.0)
LYMPHS ABS: 1.1 10*3/uL (ref 1.0–3.6)
LYMPHS PCT: 24 %
MCH: 30.8 pg (ref 26.0–34.0)
MCHC: 33.5 g/dL (ref 32.0–36.0)
MCV: 92 fL (ref 80.0–100.0)
MONOS PCT: 8 %
Monocytes Absolute: 0.4 10*3/uL (ref 0.2–0.9)
Neutro Abs: 2.9 10*3/uL (ref 1.4–6.5)
Neutrophils Relative %: 65 %
PLATELETS: 163 10*3/uL (ref 150–440)
RBC: 3.22 MIL/uL — ABNORMAL LOW (ref 3.80–5.20)
RDW: 15.1 % — ABNORMAL HIGH (ref 11.5–14.5)
WBC: 4.4 10*3/uL (ref 3.6–11.0)

## 2017-11-05 LAB — FERRITIN: Ferritin: 73 ng/mL (ref 11–307)

## 2017-11-08 ENCOUNTER — Encounter: Payer: Self-pay | Admitting: Oncology

## 2017-11-08 ENCOUNTER — Other Ambulatory Visit: Payer: Self-pay | Admitting: Neurology

## 2017-11-08 ENCOUNTER — Inpatient Hospital Stay: Payer: Medicare Other

## 2017-11-08 ENCOUNTER — Inpatient Hospital Stay (HOSPITAL_BASED_OUTPATIENT_CLINIC_OR_DEPARTMENT_OTHER): Payer: Medicare Other | Admitting: Oncology

## 2017-11-08 VITALS — BP 98/56 | HR 87 | Temp 97.3°F | Resp 18 | Wt 125.1 lb

## 2017-11-08 VITALS — BP 114/70 | HR 66 | Resp 20

## 2017-11-08 DIAGNOSIS — R011 Cardiac murmur, unspecified: Secondary | ICD-10-CM | POA: Diagnosis not present

## 2017-11-08 DIAGNOSIS — N184 Chronic kidney disease, stage 4 (severe): Secondary | ICD-10-CM | POA: Diagnosis not present

## 2017-11-08 DIAGNOSIS — D649 Anemia, unspecified: Secondary | ICD-10-CM

## 2017-11-08 DIAGNOSIS — R5381 Other malaise: Secondary | ICD-10-CM

## 2017-11-08 DIAGNOSIS — D72820 Lymphocytosis (symptomatic): Secondary | ICD-10-CM

## 2017-11-08 DIAGNOSIS — D631 Anemia in chronic kidney disease: Secondary | ICD-10-CM | POA: Insufficient documentation

## 2017-11-08 DIAGNOSIS — R5383 Other fatigue: Secondary | ICD-10-CM

## 2017-11-08 DIAGNOSIS — I129 Hypertensive chronic kidney disease with stage 1 through stage 4 chronic kidney disease, or unspecified chronic kidney disease: Secondary | ICD-10-CM

## 2017-11-08 DIAGNOSIS — M7989 Other specified soft tissue disorders: Secondary | ICD-10-CM

## 2017-11-08 MED ORDER — IRON SUCROSE 20 MG/ML IV SOLN
200.0000 mg | Freq: Once | INTRAVENOUS | Status: AC
  Administered 2017-11-08: 200 mg via INTRAVENOUS
  Filled 2017-11-08: qty 10

## 2017-11-08 MED ORDER — SODIUM CHLORIDE 0.9 % IV SOLN
Freq: Once | INTRAVENOUS | Status: AC
  Administered 2017-11-08: 12:00:00 via INTRAVENOUS
  Filled 2017-11-08: qty 1000

## 2017-11-08 NOTE — Progress Notes (Signed)
Hematology/Oncology follow-up note [facility] Telephone:([phone] [phone]   Patient Care Team: Katheren Shams as PCP - General Birder Robson, MD as Referring Physician (Ophthalmology)  REFERRING PROVIDER: Dr.Lateef REASON FOR VISIT Follow up for treatment of anemia.   HISTORY OF PRESENTING ILLNESS:  Brianna Coleman is a  77 y.o.  female with PMH listed below who was referred to me for evaluation of anemia. Patient is accompanied by his son who provides most of the history. Patient was recently hospitalized to ICU at [facility] due to subarachnoid hemorrhage after a fall, seizures symptoms. She had a chronic history of anemia and chronic kidney disease and was noticed that her anemia has gotten worse. She was referred to see me for additional workup. Patient reports feeling tired and fatigued. Denies any shortness of breath, chest pain, abdominal pain. She denies any bleeding events or black stools. Patient's son also noticed that her right lower extremity has swelled up. Patient was initially discharged home and advised to use a compression stocking. Patient's son cut off the foot portion and only put in a sleeve on her mother's legs. Since then he has noticed right lower extremity swelling.   INTERVAL HISTORY Brianna Coleman is a 77 y.o. female who has above history reviewed by me today presents for follow up visit for management of anemia. Patient was accompanied by her daughter.  Reports feeling same at baseline.  Denies chest pain, nausea vomiting diarrhea, she has chronic fatigue.  Review of Systems  Constitutional: Positive for malaise/fatigue. Negative for chills, fever and weight loss.  HENT: Negative for congestion, ear discharge, ear pain, hearing loss, nosebleeds, sinus pain and sore throat.   Eyes: Negative for blurred vision, double vision, photophobia, pain, discharge and redness.  Respiratory: Negative for cough, hemoptysis,  sputum production, shortness of breath and wheezing.   Cardiovascular: Negative for chest pain, palpitations, orthopnea, claudication and leg swelling.  Gastrointestinal: Negative for abdominal pain, blood in stool, constipation, diarrhea, heartburn, melena, nausea and vomiting.  Genitourinary: Negative for dysuria, flank pain, frequency and hematuria.  Musculoskeletal: Negative for back pain, myalgias and neck pain.  Skin: Negative for itching and rash.  Neurological: Negative for dizziness, tingling, tremors, focal weakness, weakness and headaches.  Endo/Heme/Allergies: Negative for environmental allergies. Does not bruise/bleed easily.  Psychiatric/Behavioral: Negative for depression, hallucinations and substance abuse. The patient is not nervous/anxious.     MEDICAL HISTORY:  Past Medical History:  Diagnosis Date  . Anemia   . Aortic stenosis    mild AS by 03/20/13 echo [facility])  . Arthritis, degenerative   . Blood in stool   . Cancer ([place])    skin cancer  . 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 Chronic renal insufficiency    CKD stage IV, sees Dr Holley Raring in [place]  . Dyslipidemia   . Dysphagia   . Episode of syncope    Near-syncope  . Fall   . Gait disorder   . Gastroesophageal reflux disease   . Heart murmur   . History of blood transfusion   . Hypertension   . Memory disturbance   . Neurogenic bladder   . Orthostatic hypotension 06/17/2017  . Parkinson disease ([place])   . 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 RLS (restless legs syndrome)   . Shortness of breath    with exertion  . Stroke [facility])    no residual  . 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 CAROTID ENDARTERECTOMY    . CATARACT EXTRACTION, BILATERAL    . COLONOSCOPY    . ENDARTERECTOMY Right 09/19/2013   Procedure: ENDARTERECTOMY CAROTID WITH PATCH ANGIOPLASTY;  Surgeon:  Elam Dutch, MD;  Location: [facility];  Service: Vascular;  Laterality: Right;  . EYE SURGERY Bilateral    cataract  . UPPER GI ENDOSCOPY    . vaginal cyst removal  2009    SOCIAL HISTORY: Social History   Socioeconomic History  . Marital status: Widowed    Spouse name: Not on file  . Number of children: 2  . Years of education: hs  . Highest education level: Not on file  Occupational History  . Occupation: Retired    Fish farm manager: RETIRED  Social Needs  . Financial resource strain: Not on file  . Food insecurity:    Worry: Not on file    Inability: Not on file  . Transportation needs:    Medical: Not on file    Non-medical: Not on file  Tobacco Use  . Smoking status: Never Smoker  . Smokeless tobacco: Never Used  Substance and Sexual Activity  . Alcohol use: No  . Drug use: No  . Sexual activity: Not on file  Lifestyle  . Physical activity:    Days per week: Not on file    Minutes per session: Not on file  . Stress: Not on file  Relationships  . Social connections:    Talks on phone: Not on file    Gets together: Not on file    Attends religious service: Not on file    Active member of club or organization: Not on file    Attends meetings of clubs or organizations: Not on file    Relationship status: Not on file  . Intimate partner violence:    Fear of current or ex partner: Not on file    Emotionally abused: Not on file    Physically abused: Not on file    Forced sexual activity: Not on file  Other Topics Concern  . Not on file  Social History Narrative   Patient drinks caffeine occasionally.   Patient is right handed.    FAMILY HISTORY: Family History  Problem Relation Age of Onset  . Hypertension Mother   . Heart disease Mother   . Hyperlipidemia Mother   . Breast cancer Mother   . Heart attack Father   . Hypertension Father   . Colon cancer Father   . Lung cancer Father   . Diabetes Brother   . Diabetes Brother   . Heart disease Brother   .  Hyperlipidemia Brother   . Hypertension Brother   . Varicose Veins Brother   . Deep vein thrombosis Brother   . Hypertension Sister   . Stomach cancer Maternal Grandmother     ALLERGIES:  has No Known Allergies.  MEDICATIONS:  Current Outpatient Medications  Medication Sig Dispense Refill  . aspirin EC 81 MG tablet Take 81 mg by mouth daily.    Marland Kitchen atorvastatin (LIPITOR) 40 MG tablet Take 80 mg by mouth daily.   2  . Carbidopa-Levodopa ER (SINEMET CR) 25-100 MG tablet controlled release TAKE 1 TABLET BY MOUTH FOUR TIMES A DAY 120 tablet 4  . cholecalciferol (VITAMIN D) 1000 UNITS tablet Take 1,000 Units by mouth daily.    . clopidogrel (PLAVIX) 75 MG tablet Take 1 tablet (75 mg total) by mouth daily. 30 tablet 1  .  conjugated estrogens (PREMARIN) vaginal cream Place 1 Applicatorful vaginally daily as needed.     . docusate sodium (COLACE) 100 MG capsule TAKE 1 CAPSULE (100 MG TOTAL) BY MOUTH DAILY. DO NOT TAKE IF YOU HAVE LOOSE STOOLS 30 capsule 3  . donepezil (ARICEPT) 5 MG tablet Take 1 tablet (5 mg total) by mouth at bedtime. 90 tablet 1  . ferrous gluconate (FERGON) 324 MG tablet TAKE 1 TABLET (324 MG TOTAL) BY MOUTH 2 (TWO) TIMES DAILY WITH A MEAL. 60 tablet 3  . folic acid (FOLVITE) 400 MCG tablet TAKE 1 TABLET (400 MCG TOTAL) BY MOUTH DAILY. 30 tablet 3  . NIFEdipine (PROCARDIA-XL/ADALAT CC) 30 MG 24 hr tablet Take 30 mg by mouth daily.  3  . pantoprazole (PROTONIX) 40 MG tablet Take 40 mg by mouth daily.  11  . pramipexole (MIRAPEX) 1 MG tablet Take 1 mg by mouth 3 (three) times daily.    . QUEtiapine (SEROQUEL) 25 MG tablet Take 0.5 tablets (12.5 mg total) by mouth at bedtime. 45 tablet 3  . ranitidine (ZANTAC) 150 MG tablet Take 150 mg by mouth daily.  11  . HYDROcodone-acetaminophen (NORCO/VICODIN) 5-325 MG tablet Take 1 tablet every 6 (six) hours as needed by mouth. (Patient not taking: Reported on 11/08/2017) 90 tablet 0  . ondansetron (ZOFRAN-ODT) 4 MG disintegrating tablet  Take 4 mg by mouth every 8 (eight) hours as needed. for nausea  0   No current facility-administered medications for this visit.      PHYSICAL EXAMINATION: ECOG PERFORMANCE STATUS: 2 - Symptomatic, <50% confined to bed Vitals:   11/08/17 1042  BP: (!) 98/56  Pulse: 87  Resp: 18  Temp: (!) 97.3 F (36.3 C)  SpO2: 97%   Filed Weights   11/08/17 1042  Weight: 125 lb 2 oz (56.8 kg)    Physical Exam  Constitutional: She is oriented to person, place, and time and well-developed, well-nourished, and in no distress. No distress.  HENT:  Head: Normocephalic and atraumatic.  Nose: Nose normal.  Mouth/Throat: Oropharynx is clear and moist. No oropharyngeal exudate.  Eyes: Pupils are equal, round, and reactive to light. EOM are normal. Left eye exhibits no discharge. No scleral icterus.  Neck: Normal range of motion. Neck supple. No JVD present. No tracheal deviation present.  Cardiovascular: Normal rate and regular rhythm.  Murmur heard. Loud systolic murmur  Pulmonary/Chest: Effort normal and breath sounds normal. No respiratory distress. She has no wheezes. She has no rales. She exhibits no tenderness.  Abdominal: Soft. Bowel sounds are normal. She exhibits no distension and no mass. There is no tenderness. There is no rebound.  Musculoskeletal: Normal range of motion. She exhibits no edema or tenderness.  Lymphadenopathy:    She has no cervical adenopathy.  Neurological: She is alert and oriented to person, place, and time. No cranial nerve deficit. She exhibits normal muscle tone. Coordination normal.  Skin: Skin is warm and dry. No rash noted. She is not diaphoretic. No erythema.  Psychiatric: Affect and judgment normal.     LABORATORY DATA:  I have reviewed the data as listed Lab Results  Component Value Date   WBC 4.4 11/05/2017   HGB 9.9 (L) 11/05/2017   HCT 29.7 (L) 11/05/2017   MCV 92.0 11/05/2017   PLT 163 11/05/2017   Recent Labs    12/18/16 0803  05/24/17 1459 10/26/17 1415  NA 144 139 141  K 4.4 4.6 4.3  CL 110 108 109  CO2 22 22 27  GLUCOSE 176* 108* 131*  BUN 53* 53* 38*  CREATININE 2.45* 2.88* 2.96*  CALCIUM 9.6 9.2 9.2  GFRNONAA 18* 15* 14*  GFRAA 21* 17* 17*  PROT 6.9 6.0* 6.7  ALBUMIN 3.9 3.5 3.8  AST 26 15 19   ALT 8* <5* <5*  ALKPHOS 83 53 64  BILITOT 0.7 0.7 0.7  BILIDIR  --  0.1  --   IBILI  --  0.6  --    Negative stool occult, normal folate and B12 levels.  Slightly elevated free light chain ratio at 3 likely secondary to chronic kidney disease.  No monoclonal band on protein electro pheresis. Ferritin 74.    ASSESSMENT & PLAN:  1. 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 Heart murmur   3. Monoclonal B-cell lymphocytosis    #Anemia: CBC trended down to be slightly lower than 10.  We criteria for Procrit. Iron panel reviewed, before Procrit, will give patient IV iron and improve iron stores. Plan IV iron with Venofer 200mg  weekly x 3 doses. Allergy reactions/infusion reaction including anaphylactic reaction discussed with patient. Patient voices understand and willing to proceed. Follow-up in 4 weeks, repeat iron panel.  If still anemia with hemoglobin less than 10 despite iron stores replete, will start Procrit shots.  #Heart murmur, follow-up with cardiology.  Continue  folic acid 0.4 mg daily.  #monoclonal B cell lymphocytosis, stable continue to monitor.   All questions were answered. The patient knows to call the clinic with any problems questions or concerns.  Return of visit: 4 weeks. Thank you for this kind referral and the opportunity to participate in the care of this patient. A copy of today's note is routed to referring provider    Earlie Server, MD, PhD Hematology Oncology [facility] Pager- [phone] 11/08/2017

## 2017-11-08 NOTE — Progress Notes (Signed)
Pt in with daughter, states just returned home from the beach.  Pt reports "doing well".  Verbalized no concerns or difficulties.

## 2017-11-16 ENCOUNTER — Inpatient Hospital Stay: Payer: Medicare Other

## 2017-11-16 VITALS — BP 145/74 | HR 72 | Temp 98.2°F | Resp 18

## 2017-11-16 DIAGNOSIS — D649 Anemia, unspecified: Secondary | ICD-10-CM

## 2017-11-16 DIAGNOSIS — I129 Hypertensive chronic kidney disease with stage 1 through stage 4 chronic kidney disease, or unspecified chronic kidney disease: Secondary | ICD-10-CM | POA: Diagnosis not present

## 2017-11-16 MED ORDER — SODIUM CHLORIDE 0.9 % IV SOLN
Freq: Once | INTRAVENOUS | Status: AC
  Administered 2017-11-16: 15:00:00 via INTRAVENOUS
  Filled 2017-11-16: qty 1000

## 2017-11-16 MED ORDER — IRON SUCROSE 20 MG/ML IV SOLN
200.0000 mg | Freq: Once | INTRAVENOUS | Status: AC
  Administered 2017-11-16: 200 mg via INTRAVENOUS
  Filled 2017-11-16: qty 10

## 2017-11-23 ENCOUNTER — Inpatient Hospital Stay: Payer: Medicare Other | Attending: Oncology

## 2017-11-23 VITALS — BP 103/68 | HR 71 | Temp 98.0°F | Resp 18

## 2017-11-23 DIAGNOSIS — R5383 Other fatigue: Secondary | ICD-10-CM | POA: Diagnosis not present

## 2017-11-23 DIAGNOSIS — R5381 Other malaise: Secondary | ICD-10-CM | POA: Diagnosis not present

## 2017-11-23 DIAGNOSIS — D72820 Lymphocytosis (symptomatic): Secondary | ICD-10-CM | POA: Insufficient documentation

## 2017-11-23 DIAGNOSIS — D509 Iron deficiency anemia, unspecified: Secondary | ICD-10-CM | POA: Insufficient documentation

## 2017-11-23 DIAGNOSIS — D631 Anemia in chronic kidney disease: Secondary | ICD-10-CM | POA: Diagnosis present

## 2017-11-23 DIAGNOSIS — N184 Chronic kidney disease, stage 4 (severe): Secondary | ICD-10-CM | POA: Diagnosis present

## 2017-11-23 DIAGNOSIS — I129 Hypertensive chronic kidney disease with stage 1 through stage 4 chronic kidney disease, or unspecified chronic kidney disease: Secondary | ICD-10-CM | POA: Diagnosis present

## 2017-11-23 DIAGNOSIS — D649 Anemia, unspecified: Secondary | ICD-10-CM

## 2017-11-23 MED ORDER — IRON SUCROSE 20 MG/ML IV SOLN
200.0000 mg | Freq: Once | INTRAVENOUS | Status: AC
  Administered 2017-11-23: 200 mg via INTRAVENOUS
  Filled 2017-11-23: qty 10

## 2017-11-23 MED ORDER — SODIUM CHLORIDE 0.9 % IV SOLN
Freq: Once | INTRAVENOUS | Status: AC
  Administered 2017-11-23: 14:00:00 via INTRAVENOUS
  Filled 2017-11-23: qty 1000

## 2017-11-30 ENCOUNTER — Other Ambulatory Visit: Payer: Self-pay

## 2017-11-30 ENCOUNTER — Emergency Department
Admission: EM | Admit: 2017-11-30 | Discharge: 2017-11-30 | Disposition: A | Discharge status: Home / Self Care | Payer: Medicare Other | Attending: Emergency Medicine | Admitting: Emergency Medicine

## 2017-11-30 ENCOUNTER — Emergency Department: Payer: Medicare Other

## 2017-11-30 DIAGNOSIS — N184 Chronic kidney disease, stage 4 (severe): Secondary | ICD-10-CM | POA: Diagnosis not present

## 2017-11-30 DIAGNOSIS — Y9389 Activity, other specified: Secondary | ICD-10-CM | POA: Insufficient documentation

## 2017-11-30 DIAGNOSIS — Y999 Unspecified external cause status: Secondary | ICD-10-CM | POA: Insufficient documentation

## 2017-11-30 DIAGNOSIS — S0990XA Unspecified injury of head, initial encounter: Secondary | ICD-10-CM | POA: Insufficient documentation

## 2017-11-30 DIAGNOSIS — I129 Hypertensive chronic kidney disease with stage 1 through stage 4 chronic kidney disease, or unspecified chronic kidney disease: Secondary | ICD-10-CM | POA: Insufficient documentation

## 2017-11-30 DIAGNOSIS — W19XXXA Unspecified fall, initial encounter: Secondary | ICD-10-CM

## 2017-11-30 DIAGNOSIS — W01198A Fall on same level from slipping, tripping and stumbling with subsequent striking against other object, initial encounter: Secondary | ICD-10-CM | POA: Diagnosis not present

## 2017-11-30 DIAGNOSIS — Z7982 Long term (current) use of aspirin: Secondary | ICD-10-CM | POA: Insufficient documentation

## 2017-11-30 DIAGNOSIS — Y9289 Other specified places as the place of occurrence of the external cause: Secondary | ICD-10-CM | POA: Diagnosis not present

## 2017-11-30 DIAGNOSIS — Z79899 Other long term (current) drug therapy: Secondary | ICD-10-CM | POA: Diagnosis not present

## 2017-11-30 DIAGNOSIS — G20 Parkinson's disease: Secondary | ICD-10-CM | POA: Insufficient documentation

## 2017-11-30 DIAGNOSIS — Z7902 Long term (current) use of antithrombotics/antiplatelets: Secondary | ICD-10-CM | POA: Diagnosis not present

## 2017-11-30 LAB — CBC
HEMATOCRIT: 29.9 % — AB (ref 35.0–47.0)
Hemoglobin: 10 g/dL — ABNORMAL LOW (ref 12.0–16.0)
MCH: 31.3 pg (ref 26.0–34.0)
MCHC: 33.6 g/dL (ref 32.0–36.0)
MCV: 93 fL (ref 80.0–100.0)
PLATELETS: 147 10*3/uL — AB (ref 150–440)
RBC: 3.21 MIL/uL — AB (ref 3.80–5.20)
RDW: 16.5 % — ABNORMAL HIGH (ref 11.5–14.5)
WBC: 3.8 10*3/uL (ref 3.6–11.0)

## 2017-11-30 LAB — BASIC METABOLIC PANEL
Anion gap: 8 (ref 5–15)
BUN: 30 mg/dL — AB (ref 6–20)
CHLORIDE: 108 mmol/L (ref 101–111)
CO2: 23 mmol/L (ref 22–32)
CREATININE: 2.7 mg/dL — AB (ref 0.44–1.00)
Calcium: 9.2 mg/dL (ref 8.9–10.3)
GFR calc Af Amer: 18 mL/min — ABNORMAL LOW (ref >60)
GFR calc non Af Amer: 16 mL/min — ABNORMAL LOW (ref >60)
Glucose, Bld: 155 mg/dL — ABNORMAL HIGH (ref 65–99)
POTASSIUM: 4.2 mmol/L (ref 3.5–5.1)
Sodium: 139 mmol/L (ref 135–145)

## 2017-11-30 NOTE — Discharge Instructions (Signed)

## 2017-11-30 NOTE — ED Provider Notes (Signed)
[facility] Emergency Department Provider Note  ____________________________________________   First MD Initiated Contact with Patient 11/30/17 1402     (approximate)  I have reviewed the triage vital signs and the nursing notes.   HISTORY  Chief Complaint Fall and Head Injury    HPI Brianna Coleman is a 77 y.o. female whose PMH includes everything listed below, most notably including Parkinson's.  She presents per private vehicle for evaluation after a fall.  She has frequent falls due to the instability the results from the Parkinson's and she had a fall that resulted in an intracranial hemorrhage about 6 months ago.  This morning she was a little bit unstable and off-balance which is common for her, and as she was transferring using her walker she says that she got over balance and fell backwards.  She thinks that she hit the back of her head against a door jam.  She does not believe she lost consciousness.  She has some mild pain in her right elbow but denies headache and neck pain.  She denies chest pain, shortness of breath, nausea, vomiting, fever/chills, and abdominal pain.  She is at her baseline according to her son who is at bedside.  She takes a daily baby aspirin but no other blood thinners.  The onset of her symptoms was acute and potentially severe but her discomfort/injuries are mild.  Past Medical History:  Diagnosis Date  . Anemia   . Aortic stenosis    mild AS by 03/20/13 echo [facility])  . Arthritis, degenerative   . Blood in stool   . Cancer ([place])    skin cancer  . 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 Chronic renal insufficiency    CKD stage IV, sees Dr Holley Raring in [place]  . Dyslipidemia   . Dysphagia   . Episode of syncope    Near-syncope  . Fall   . Gait disorder   . Gastroesophageal reflux disease   . Heart murmur   . History of blood transfusion   . Hypertension   . Memory disturbance   .  Neurogenic bladder   . Orthostatic hypotension 06/17/2017  . Parkinson disease ([place])   . 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 RLS (restless legs syndrome)   . Shortness of breath    with exertion  . Stroke [facility])    no residual  . 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 Anemia in stage 4 chronic kidney disease ([place]) 11/08/2017  . Heart murmur 11/08/2017  . 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 Anemia 07/30/2017  . Orthostatic hypotension 06/17/2017  . TIA (transient ischemic attack) 05/24/2017  . Hallucinations 04/26/2017  . Apraxia 12/21/2016  . Cervicogenic headache 02/05/2016  . 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 Cerebrovascular accident (CVA) ([place]) 08/12/2015  . Transient alteration of awareness 07/31/2015  . 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 Absolute anemia 01/26/2014  . Aortic valve defect 01/26/2014  . Congestive heart failure ([place]) 01/26/2014  . Benign essential HTN 01/26/2014  . HLD (hyperlipidemia) 01/26/2014  . Pure hypercholesterolemia 01/26/2014  . Avitaminosis D 01/26/2014  . Carotid artery stenosis 09/19/2013  . Carotid artery obstruction 09/19/2013  . Occlusion and stenosis of carotid artery without mention of cerebral infarction 09/14/2013  . Paralysis agitans ([place]) 10/04/2012  . Abnormality of gait 10/04/2012  . Parkinson's  disease ([place]) 10/04/2012  . Memory loss 06/21/2012  . Dysphagia, unspecified(787.20) 06/21/2012  . Unspecified essential hypertension 06/21/2012  . Other and unspecified hyperlipidemia 06/21/2012  . Unspecified cataract 06/21/2012  . Cataract 06/21/2012  . Can't get food down 06/21/2012  . Essential (primary) hypertension 06/21/2012    Past Surgical History:  Procedure Laterality Date  . CAROTID ENDARTERECTOMY    . CATARACT EXTRACTION, BILATERAL    . COLONOSCOPY    . ENDARTERECTOMY Right  09/19/2013   Procedure: ENDARTERECTOMY CAROTID WITH PATCH ANGIOPLASTY;  Surgeon: Elam Dutch, MD;  Location: [place];  Service: Vascular;  Laterality: Right;  . EYE SURGERY Bilateral    cataract  . UPPER GI ENDOSCOPY    . vaginal cyst removal  2009    Prior to Admission medications   Medication Sig Start Date End Date Taking? Authorizing Provider  aspirin EC 81 MG tablet Take 81 mg by mouth daily.    [provider]  atorvastatin (LIPITOR) 40 MG tablet Take 80 mg by mouth daily.  07/01/17   [provider]  Carbidopa-Levodopa ER (SINEMET CR) 25-100 MG tablet controlled release TAKE 1 TABLET BY MOUTH FOUR TIMES A DAY 10/27/17   Kathrynn Ducking, MD  cholecalciferol (VITAMIN D) 1000 UNITS tablet Take 1,000 Units by mouth daily.    [provider]  clopidogrel (PLAVIX) 75 MG tablet Take 1 tablet (75 mg total) by mouth daily. 05/25/17   Henreitta Leber, MD  conjugated estrogens (PREMARIN) vaginal cream Place 1 Applicatorful vaginally daily as needed.     [provider]  docusate sodium (COLACE) 100 MG capsule TAKE 1 CAPSULE (100 MG TOTAL) BY MOUTH DAILY. DO NOT TAKE IF YOU HAVE LOOSE STOOLS 11/03/17   Earlie Server, MD  donepezil (ARICEPT) 5 MG tablet Take 1 tablet (5 mg total) by mouth at bedtime. 10/25/17   Kathrynn Ducking, MD  ferrous gluconate (FERGON) 324 MG tablet TAKE 1 TABLET (324 MG TOTAL) BY MOUTH 2 (TWO) TIMES DAILY WITH A MEAL. 11/03/17   Earlie Server, MD  folic acid (FOLVITE) 161 MCG tablet TAKE 1 TABLET (400 MCG TOTAL) BY MOUTH DAILY. 11/03/17   Earlie Server, MD  HYDROcodone-acetaminophen (NORCO/VICODIN) 5-325 MG tablet Take 1 tablet every 6 (six) hours as needed by mouth. Patient not taking: Reported on 11/08/2017 04/26/17   Kathrynn Ducking, MD  NIFEdipine (PROCARDIA-XL/ADALAT CC) 30 MG 24 hr tablet Take 30 mg by mouth daily. 09/07/17   [provider]  ondansetron (ZOFRAN-ODT) 4 MG disintegrating tablet Take 4 mg by mouth every 8 (eight) hours as  needed. for nausea 09/20/17   [provider]  pantoprazole (PROTONIX) 40 MG tablet Take 40 mg by mouth daily. 09/07/17   [provider]  pramipexole (MIRAPEX) 1 MG tablet Take 1 mg by mouth 3 (three) times daily.    [provider]  QUEtiapine (SEROQUEL) 25 MG tablet Take 0.5 tablets (12.5 mg total) by mouth at bedtime. 08/18/17   Kathrynn Ducking, MD  ranitidine (ZANTAC) 150 MG tablet Take 150 mg by mouth daily. 06/29/17   [provider]    Allergies Patient has no known allergies.  Family History  Problem Relation Age of Onset  . Hypertension Mother   . Heart disease Mother   . Hyperlipidemia Mother   . Breast cancer Mother   . Heart attack Father   . Hypertension Father   . Colon cancer Father   . Lung cancer Father   . Diabetes Brother   .  Diabetes Brother   . Heart disease Brother   . Hyperlipidemia Brother   . Hypertension Brother   . Varicose Veins Brother   . Deep vein thrombosis Brother   . Hypertension Sister   . Stomach cancer Maternal Grandmother     Social History Social History   Tobacco Use  . Smoking status: Never Smoker  . Smokeless tobacco: Never Used  Substance Use Topics  . Alcohol use: No  . Drug use: No    Review of Systems Constitutional: No fever/chills Eyes: No visual changes. ENT: No sore throat. Cardiovascular: Denies chest pain. Respiratory: Denies shortness of breath. Gastrointestinal: No abdominal pain.  No nausea, no vomiting.  No diarrhea.  No constipation. Genitourinary: Negative for dysuria. Musculoskeletal: Mild pain in right elbow.  Negative for neck pain.  Negative for back pain. Integumentary: Negative for rash. Neurological: Chronic unsteadiness secondary to Parkinson's.  Negative for headaches, focal weakness or numbness.   ____________________________________________   PHYSICAL EXAM:  VITAL SIGNS: ED Triage Vitals  Enc Vitals Group     BP 11/30/17 1207 (!) 124/58     Pulse Rate  11/30/17 1207 80     Resp 11/30/17 1207 16     Temp 11/30/17 1207 98.6 F (37 C)     Temp Source 11/30/17 1207 Oral     SpO2 11/30/17 1207 97 %     Weight 11/30/17 1206 56.2 kg (124 lb)     Height 11/30/17 1206 1.549 m (5\' 1" )     Head Circumference --      Peak Flow --      Pain Score --      Pain Loc --      Pain Edu? --      Excl. in [place]? --     Constitutional: Alert and oriented. no acute distress. Eyes: Conjunctivae are normal.  Head: Atraumatic except for a small contusion/hematoma to the right side of the crown of her head.  Minimal tenderness to palpation. Nose: No congestion/rhinnorhea. Mouth/Throat: Mucous membranes are moist. Neck: No stridor.  No meningeal signs.  No cervical spine tenderness to palpation. Cardiovascular: Normal rate, regular rhythm. Good peripheral circulation. Grossly normal heart sounds. Respiratory: Normal respiratory effort.  No retractions. Lungs CTAB. Gastrointestinal: Soft and nontender. No distention.  Musculoskeletal: No lower extremity tenderness nor edema. No gross deformities of extremities.  She has a very mild abrasion to the right elbow but no ecchymosis, no erythema, and she has normal range of motion of both elbows and nontender range of motion of both hips and knees. Neurologic:  Normal speech and language. No gross focal neurologic deficits are appreciated.  Skin:  Skin is warm, dry and intact. No rash noted. Psychiatric: Mood and affect are normal. Speech and behavior are normal.  ____________________________________________   LABS (all labs ordered are listed, but only abnormal results are displayed)  Labs Reviewed  BASIC METABOLIC PANEL - Abnormal; Notable for the following components:      Result Value   Glucose, Bld 155 (*)    BUN 30 (*)    Creatinine, Ser 2.70 (*)    GFR calc non Af Amer 16 (*)    GFR calc Af Amer 18 (*)    All other components within normal limits  CBC - Abnormal; Notable for the following components:     RBC 3.21 (*)    Hemoglobin 10.0 (*)    HCT 29.9 (*)    RDW 16.5 (*)    Platelets 147 (*)  All other components within normal limits   ____________________________________________  EKG  ED ECG REPORT I, Hinda Kehr, the attending physician, personally viewed and interpreted this ECG.  Date: 11/30/2017 EKG Time: 12:01 Rate: 78 Rhythm: normal sinus rhythm QRS Axis: normal Intervals: RBBB ST/T Wave abnormalities: Non-specific ST segment / T-wave changes, but no evidence of acute ischemia. Narrative Interpretation: no evidence of acute ischemia   ____________________________________________  RADIOLOGY   ED MD interpretation: No indication of intracranial bleeding  Official radiology report(s): Ct Head Wo Contrast  Result Date: 11/30/2017 CLINICAL DATA:  Status post fall this morning. EXAM: CT HEAD WITHOUT CONTRAST TECHNIQUE: Contiguous axial images were obtained from the base of the skull through the vertex without intravenous contrast. COMPARISON:  Brain MRI 05/25/2017.  Head CT scan 05/24/2017. FINDINGS: Brain: No evidence of acute infarction, hemorrhage, hydrocephalus, extra-axial collection or mass lesion/mass effect. Remote lacunar infarctions in the basal ganglia and chronic microvascular ischemic change noted. Small, chronic remote left parietal lobe infarct also identified. Vascular: Atherosclerosis is seen. Skull: Intact.  No focal lesion. Sinuses/Orbits: Negative. Other: None. IMPRESSION: No acute abnormality. Atrophy, chronic microvascular ischemic change and remote lacunar infarctions. Electronically Signed   By: Inge Rise M.D.   On: 11/30/2017 13:17    ____________________________________________   PROCEDURES  Critical Care performed: No   Procedure(s) performed:   Procedures   ____________________________________________   INITIAL IMPRESSION / ASSESSMENT AND PLAN / ED COURSE  As part of my medical decision making, I reviewed the following  data within the [place] History obtained from family, Nursing notes reviewed and incorporated, Labs reviewed , EKG interpreted  and Notes from prior ED visits    Differential diagnosis includes, but is not limited to, mechanical fall, syncope, cardiogenic issue such as ACS or arrhythmia, CVA, intracranial bleeding, fracture or dislocation.  Fortunately she has only minimal discomfort and appears to be at her baseline.  No evidence of any clinically significant musculoskeletal injuries other than a very minimal abrasion to her right elbow.  CT scan is clear of acute injury/bleeding and she is at her baseline mental status.  Her lab work is normal for her; her baseline creatinine is between 2-1/2 and 3 with a baseline GFR of 17 according to her son which is where she is at today as well.  CBC is stable.  No chest pain or shortness of breath.  I have dated the patient and her son about the fortunate and reassuring results and they are comfortable with the plan for discharge and outpatient follow-up. I gave my usual and customary return precautions.      ____________________________________________  FINAL CLINICAL IMPRESSION(S) / ED DIAGNOSES  Final diagnoses:  Fall, initial encounter  Minor head injury, initial encounter     MEDICATIONS GIVEN DURING THIS VISIT:  Medications - No data to display   ED Discharge Orders    None       Note:  This document was prepared using Dragon voice recognition software and may include unintentional dictation errors.    Hinda Kehr, MD 11/30/17 [phone]

## 2017-11-30 NOTE — ED Notes (Signed)
Pt taken to POV in [place] without difficulty. VSS. NAD. Discharge instructions and follow up discussed with pt and son. All questions addressed.

## 2017-11-30 NOTE — ED Triage Notes (Signed)
Pt arrived via POV with son with reports of fall this morning around 11:20.  No LOC. Son states he was in a different room but heard her fall.  Pt was walking using a walker, but got a pain in the left lower back and fell backward. Head hit the door frame on the back of her head.  Son states pt also has bump on the top of her head as well.  Pt has Parkinson's and has hx of multiple falls.  Per son in Dec 2018, pt was in [place] with brain bleed s/p on carpeted concrete.  Pt is alert at this time, c/o right arm pain. Per son, pt has abrasion to right elbow as well.

## 2017-11-30 NOTE — ED Notes (Signed)
D/w Dr. Cherylann Banas, new order for CT head without contrast ordered

## 2017-12-03 ENCOUNTER — Other Ambulatory Visit: Payer: Self-pay

## 2017-12-03 ENCOUNTER — Inpatient Hospital Stay: Payer: Medicare Other

## 2017-12-03 DIAGNOSIS — D631 Anemia in chronic kidney disease: Secondary | ICD-10-CM

## 2017-12-03 DIAGNOSIS — I129 Hypertensive chronic kidney disease with stage 1 through stage 4 chronic kidney disease, or unspecified chronic kidney disease: Secondary | ICD-10-CM | POA: Diagnosis not present

## 2017-12-03 DIAGNOSIS — N184 Chronic kidney disease, stage 4 (severe): Principal | ICD-10-CM

## 2017-12-03 LAB — FERRITIN: Ferritin: 281 ng/mL (ref 11–307)

## 2017-12-03 LAB — CBC WITH DIFFERENTIAL/PLATELET
BASOS PCT: 1 %
Basophils Absolute: 0 10*3/uL (ref 0–0.1)
EOS ABS: 0 10*3/uL (ref 0–0.7)
Eosinophils Relative: 2 %
HEMATOCRIT: 30.9 % — AB (ref 35.0–47.0)
HEMOGLOBIN: 10.4 g/dL — AB (ref 12.0–16.0)
Lymphocytes Relative: 23 %
Lymphs Abs: 0.7 10*3/uL — ABNORMAL LOW (ref 1.0–3.6)
MCH: 31.2 pg (ref 26.0–34.0)
MCHC: 33.8 g/dL (ref 32.0–36.0)
MCV: 92.4 fL (ref 80.0–100.0)
MONOS PCT: 7 %
Monocytes Absolute: 0.2 10*3/uL (ref 0.2–0.9)
NEUTROS ABS: 2 10*3/uL (ref 1.4–6.5)
NEUTROS PCT: 67 %
Platelets: 149 10*3/uL — ABNORMAL LOW (ref 150–440)
RBC: 3.35 MIL/uL — ABNORMAL LOW (ref 3.80–5.20)
RDW: 16.1 % — ABNORMAL HIGH (ref 11.5–14.5)
WBC: 3 10*3/uL — ABNORMAL LOW (ref 3.6–11.0)

## 2017-12-03 LAB — IRON AND TIBC
Iron: 52 ug/dL (ref 28–170)
SATURATION RATIOS: 27 % (ref 10.4–31.8)
TIBC: 193 ug/dL — AB (ref 250–450)
UIBC: 141 ug/dL

## 2017-12-06 ENCOUNTER — Inpatient Hospital Stay (HOSPITAL_BASED_OUTPATIENT_CLINIC_OR_DEPARTMENT_OTHER): Payer: Medicare Other | Admitting: Oncology

## 2017-12-06 ENCOUNTER — Other Ambulatory Visit: Payer: Self-pay

## 2017-12-06 ENCOUNTER — Inpatient Hospital Stay: Payer: Medicare Other

## 2017-12-06 ENCOUNTER — Encounter: Payer: Self-pay | Admitting: Oncology

## 2017-12-06 VITALS — BP 116/68 | HR 94 | Temp 98.1°F | Resp 18 | Wt 122.9 lb

## 2017-12-06 DIAGNOSIS — I129 Hypertensive chronic kidney disease with stage 1 through stage 4 chronic kidney disease, or unspecified chronic kidney disease: Secondary | ICD-10-CM | POA: Diagnosis not present

## 2017-12-06 DIAGNOSIS — D631 Anemia in chronic kidney disease: Secondary | ICD-10-CM | POA: Diagnosis not present

## 2017-12-06 DIAGNOSIS — R5381 Other malaise: Secondary | ICD-10-CM | POA: Diagnosis not present

## 2017-12-06 DIAGNOSIS — N184 Chronic kidney disease, stage 4 (severe): Secondary | ICD-10-CM | POA: Diagnosis not present

## 2017-12-06 DIAGNOSIS — D72820 Lymphocytosis (symptomatic): Secondary | ICD-10-CM | POA: Diagnosis not present

## 2017-12-06 DIAGNOSIS — D509 Iron deficiency anemia, unspecified: Secondary | ICD-10-CM

## 2017-12-06 DIAGNOSIS — R5383 Other fatigue: Secondary | ICD-10-CM

## 2017-12-06 DIAGNOSIS — D649 Anemia, unspecified: Secondary | ICD-10-CM

## 2017-12-06 NOTE — Progress Notes (Signed)
Hematology/Oncology follow-up note [facility] [phone] [phone]   Patient Care Team: [place] Pink, MD as PCP - General (Family Medicine) Birder Robson, MD as Referring Physician (Ophthalmology)  REFERRING PROVIDER: Dr.Lateef REASON FOR VISIT Follow up for treatment of anemia.   HISTORY OF PRESENTING ILLNESS:  Brianna Coleman is a  77 y.o.  female with PMH listed below who was referred to me for evaluation of anemia. Patient is accompanied by his son who provides most of the history. Patient was recently hospitalized to ICU at [facility] due to subarachnoid hemorrhage after a fall, seizures symptoms. She had a chronic history of anemia and chronic kidney disease and was noticed that her anemia has gotten worse. She was referred to see me for additional workup. Patient reports feeling tired and fatigued. Denies any shortness of breath, chest pain, abdominal pain. She denies any bleeding events or black stools. Patient's son also noticed that her right lower extremity has swelled up. Patient was initially discharged home and advised to use a compression stocking. Patient's son cut off the foot portion and only put in a sleeve on her mother's legs. Since then he has noticed right lower extremity swelling.   INTERVAL HISTORY Brianna Coleman is a 77 y.o. female who has above history reviewed by me today presents for management of anemia.  Accompanied by daughter and son. Status post IV Venofer x 3 doses. Fatigue: Improved.  Feeling less tight now. Chronic shortness of breath with exertion, stable.  Review of Systems  Constitutional: Positive for malaise/fatigue. Negative for chills, fever and weight loss.  HENT: Negative for congestion, ear discharge, ear pain, hearing loss, nosebleeds, sinus pain and sore throat.   Eyes: Negative for blurred vision, double vision, photophobia, pain, discharge and redness.  Respiratory: Negative for cough,  hemoptysis, sputum production, shortness of breath and wheezing.   Cardiovascular: Negative for chest pain, palpitations, orthopnea, claudication and leg swelling.  Gastrointestinal: Negative for abdominal pain, blood in stool, constipation, diarrhea, heartburn, melena, nausea and vomiting.  Genitourinary: Negative for dysuria, flank pain, frequency and hematuria.  Musculoskeletal: Negative for back pain, myalgias and neck pain.  Skin: Negative for itching and rash.  Neurological: Negative for dizziness, tingling, tremors, focal weakness, weakness and headaches.  Endo/Heme/Allergies: Negative for environmental allergies. Does not bruise/bleed easily.  Psychiatric/Behavioral: Negative for depression, hallucinations and substance abuse. The patient is not nervous/anxious.     MEDICAL HISTORY:  Past Medical History:  Diagnosis Date  . Anemia   . Aortic stenosis    mild AS by 03/20/13 echo [facility])  . Arthritis, degenerative   . Blood in stool   . Cancer ([place])    skin cancer  . 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 Chronic renal insufficiency    CKD stage IV, sees Dr Holley Raring in [place]  . Dyslipidemia   . Dysphagia   . Episode of syncope    Near-syncope  . Fall   . Gait disorder   . Gastroesophageal reflux disease   . Heart murmur   . History of blood transfusion   . Hypertension   . Memory disturbance   . Neurogenic bladder   . Orthostatic hypotension 06/17/2017  . Parkinson disease ([place])   . 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 RLS (restless legs syndrome)   . Shortness of breath    with exertion  . Stroke [facility])    no residual  . 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 CAROTID ENDARTERECTOMY    . CATARACT EXTRACTION, BILATERAL    . COLONOSCOPY    . ENDARTERECTOMY Right 09/19/2013   Procedure: ENDARTERECTOMY CAROTID WITH PATCH ANGIOPLASTY;   Surgeon: Elam Dutch, MD;  Location: [facility];  Service: Vascular;  Laterality: Right;  . EYE SURGERY Bilateral    cataract  . UPPER GI ENDOSCOPY    . vaginal cyst removal  2009    SOCIAL HISTORY: Social History   Socioeconomic History  . Marital status: Widowed    Spouse name: Not on file  . Number of children: 2  . Years of education: hs  . Highest education level: Not on file  Occupational History  . Occupation: Retired    Fish farm manager: RETIRED  Social Needs  . Financial resource strain: Not on file  . Food insecurity:    Worry: Not on file    Inability: Not on file  . Transportation needs:    Medical: Not on file    Non-medical: Not on file  Tobacco Use  . Smoking status: Never Smoker  . Smokeless tobacco: Never Used  Substance and Sexual Activity  . Alcohol use: No  . Drug use: No  . Sexual activity: Not on file  Lifestyle  . Physical activity:    Days per week: Not on file    Minutes per session: Not on file  . Stress: Not on file  Relationships  . Social connections:    Talks on phone: Not on file    Gets together: Not on file    Attends religious service: Not on file    Active member of club or organization: Not on file    Attends meetings of clubs or organizations: Not on file    Relationship status: Not on file  . Intimate partner violence:    Fear of current or ex partner: Not on file    Emotionally abused: Not on file    Physically abused: Not on file    Forced sexual activity: Not on file  Other Topics Concern  . Not on file  Social History Narrative   Patient drinks caffeine occasionally.   Patient is right handed.    FAMILY HISTORY: Family History  Problem Relation Age of Onset  . Hypertension Mother   . Heart disease Mother   . Hyperlipidemia Mother   . Breast cancer Mother   . Heart attack Father   . Hypertension Father   . Colon cancer Father   . Lung cancer Father   . Diabetes Brother   . Diabetes Brother   . Heart disease  Brother   . Hyperlipidemia Brother   . Hypertension Brother   . Varicose Veins Brother   . Deep vein thrombosis Brother   . Hypertension Sister   . Stomach cancer Maternal Grandmother     ALLERGIES:  has No Known Allergies.  MEDICATIONS:  Current Outpatient Medications  Medication Sig Dispense Refill  . aspirin EC 81 MG tablet Take 81 mg by mouth daily.    Marland Kitchen atorvastatin (LIPITOR) 40 MG tablet Take 80 mg by mouth daily.   2  . Carbidopa-Levodopa ER (SINEMET CR) 25-100 MG tablet controlled release TAKE 1 TABLET BY MOUTH FOUR TIMES A DAY 120 tablet 4  . cholecalciferol (VITAMIN D) 1000 UNITS tablet Take 1,000 Units by mouth daily.    . clopidogrel (PLAVIX) 75 MG tablet Take 1 tablet (75 mg total) by mouth  daily. 30 tablet 1  . conjugated estrogens (PREMARIN) vaginal cream Place 1 Applicatorful vaginally daily as needed.     . docusate sodium (COLACE) 100 MG capsule TAKE 1 CAPSULE (100 MG TOTAL) BY MOUTH DAILY. DO NOT TAKE IF YOU HAVE LOOSE STOOLS 30 capsule 3  . donepezil (ARICEPT) 5 MG tablet Take 1 tablet (5 mg total) by mouth at bedtime. 90 tablet 1  . ferrous gluconate (FERGON) 324 MG tablet TAKE 1 TABLET (324 MG TOTAL) BY MOUTH 2 (TWO) TIMES DAILY WITH A MEAL. 60 tablet 3  . folic acid (FOLVITE) 941 MCG tablet TAKE 1 TABLET (400 MCG TOTAL) BY MOUTH DAILY. 30 tablet 3  . HYDROcodone-acetaminophen (NORCO/VICODIN) 5-325 MG tablet Take 1 tablet every 6 (six) hours as needed by mouth. 90 tablet 0  . NIFEdipine (PROCARDIA-XL/ADALAT CC) 30 MG 24 hr tablet Take 30 mg by mouth daily.  3  . ondansetron (ZOFRAN-ODT) 4 MG disintegrating tablet Take 4 mg by mouth every 8 (eight) hours as needed. for nausea  0  . pantoprazole (PROTONIX) 40 MG tablet Take 40 mg by mouth daily.  11  . pramipexole (MIRAPEX) 1 MG tablet Take 1 mg by mouth 3 (three) times daily.    . QUEtiapine (SEROQUEL) 25 MG tablet Take 0.5 tablets (12.5 mg total) by mouth at bedtime. 45 tablet 3  . ranitidine (ZANTAC) 150 MG tablet  Take 150 mg by mouth daily.  11   No current facility-administered medications for this visit.      PHYSICAL EXAMINATION: ECOG PERFORMANCE STATUS: 2 - Symptomatic, <50% confined to bed Vitals:   12/06/17 1016  BP: 116/68  Pulse: 94  Resp: 18  Temp: 98.1 F (36.7 C)  SpO2: 97%   Filed Weights   12/06/17 1016  Weight: 122 lb 14.4 oz (55.7 kg)    Physical Exam  Constitutional: She is oriented to person, place, and time and well-developed, well-nourished, and in no distress. No distress.  HENT:  Head: Normocephalic and atraumatic.  Nose: Nose normal.  Mouth/Throat: Oropharynx is clear and moist. No oropharyngeal exudate.  Eyes: Pupils are equal, round, and reactive to light. EOM are normal. Left eye exhibits no discharge. No scleral icterus.  Neck: Normal range of motion. Neck supple. No JVD present. No tracheal deviation present.  Cardiovascular: Normal rate and regular rhythm.  Murmur heard. Loud systolic murmur  Pulmonary/Chest: Effort normal and breath sounds normal. No respiratory distress. She has no wheezes. She has no rales. She exhibits no tenderness.  Abdominal: Soft. Bowel sounds are normal. She exhibits no distension and no mass. There is no tenderness. There is no rebound.  Musculoskeletal: Normal range of motion. She exhibits no edema or tenderness.  Lymphadenopathy:    She has no cervical adenopathy.  Neurological: She is alert and oriented to person, place, and time. No cranial nerve deficit. She exhibits normal muscle tone. Coordination normal.  Skin: Skin is warm and dry. No rash noted. She is not diaphoretic. No erythema.  Psychiatric: Affect and judgment normal.     LABORATORY DATA:  I have reviewed the data as listed Lab Results  Component Value Date   WBC 3.0 (L) 12/03/2017   HGB 10.4 (L) 12/03/2017   HCT 30.9 (L) 12/03/2017   MCV 92.4 12/03/2017   PLT 149 (L) 12/03/2017   Recent Labs    12/18/16 0803 05/24/17 1459 10/26/17 1415  11/30/17 1209  NA 144 139 141 139  K 4.4 4.6 4.3 4.2  CL 110 108 109 108  CO2 22 22 27 23   GLUCOSE 176* 108* 131* 155*  BUN 53* 53* 38* 30*  CREATININE 2.45* 2.88* 2.96* 2.70*  CALCIUM 9.6 9.2 9.2 9.2  GFRNONAA 18* 15* 14* 16*  GFRAA 21* 17* 17* 18*  PROT 6.9 6.0* 6.7  --   ALBUMIN 3.9 3.5 3.8  --   AST 26 15 19   --   ALT 8* <5* <5*  --   ALKPHOS 83 53 64  --   BILITOT 0.7 0.7 0.7  --   BILIDIR  --  0.1  --   --   IBILI  --  0.6  --   --    Negative stool occult, normal folate and B12 levels.  Slightly elevated free light chain ratio at 3 likely secondary to chronic kidney disease.  No monoclonal band on protein electro pheresis. Ferritin 74.    ASSESSMENT & PLAN:  1. Anemia, unspecified type   2. 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 Monoclonal B-cell lymphocytosis   4. Iron deficiency anemia, unspecified iron deficiency anemia type    #Anemia: Combination of chronic kidney disease and iron deficiency.   Labs reviewed hemoglobin improved after IV iron.  Hold additional iron infusion and she does not criteria for Procrit. . Follow-up in 8 weeks, repeat iron panel for assessment for the need of additional IV iron.Marland Kitchen  #Heart murmur, follow-up with cardiology.  Continue folic acid 0.4 mg daily.    #monoclonal B cell lymphocytosis, stable, continue to monitor.     All questions were answered. The patient knows to call the clinic with any problems questions or concerns.  Return of visit: 8weeks. Thank you for this kind referral and the opportunity to participate in the care of this patient. A copy of today's note is routed to referring provider    Earlie Server, MD, PhD Hematology Oncology [facility] at [facility] Pager- [phone] 12/06/2017

## 2017-12-06 NOTE — Progress Notes (Signed)
Patient here today for follow up. No concerns voiced.  °

## 2018-01-11 ENCOUNTER — Telehealth: Payer: Self-pay | Admitting: Neurology

## 2018-01-11 MED ORDER — QUETIAPINE FUMARATE 25 MG PO TABS: 25.0000 mg | ORAL_TABLET | Freq: Every day | ORAL | 3 refills | Status: AC

## 2018-01-11 NOTE — Telephone Encounter (Signed)
I called the family.  The patient is having some more upsetting hallucinations.  She is on a very low dose Seroquel, we will go up to 12.5 mg in the late afternoon and at bedtime for a total of 25 mg daily.  I have sent in a prescription for the Seroquel.

## 2018-01-11 NOTE — Telephone Encounter (Signed)
Pts son [name] requesting a call to discuss the pts dosing for QUEtiapine (SEROQUEL) 25 MG tablet. Please call to advise

## 2018-01-11 NOTE — Telephone Encounter (Signed)
Dr. Jannifer Franklin- please advise Called son back. He states she is taking seroquel 12.5mg  (1/2 tab) at bedtime. The hallucinations improved for a little bit but for the past three weeks, intermittently, she has had hallucinations of seeing knife on bed and also her walking around with knife. She started new eye drops today but otherwise no other new medications.  She does seem more confused and weaker in the legs. She had one fall in the last month. She woke up that morning and her back was bothering her. She got up to use walker and son thinks her back spasms. She leaned back and fell. Denied any injuries.She also has had many near falls. Denies any UTI sx. She has had trouble with bowel movements. Son has been giving miralax and this has resolved.  Son wondering if seroquel dose can be increased. He knows Dr. Jannifer Franklin mentioned another medication that is better but not covered by insurance and expensive.

## 2018-02-04 ENCOUNTER — Inpatient Hospital Stay: Payer: Medicare Other | Attending: Oncology

## 2018-02-04 DIAGNOSIS — I129 Hypertensive chronic kidney disease with stage 1 through stage 4 chronic kidney disease, or unspecified chronic kidney disease: Secondary | ICD-10-CM | POA: Diagnosis not present

## 2018-02-04 DIAGNOSIS — D649 Anemia, unspecified: Secondary | ICD-10-CM

## 2018-02-04 DIAGNOSIS — D72820 Lymphocytosis (symptomatic): Secondary | ICD-10-CM | POA: Diagnosis not present

## 2018-02-04 DIAGNOSIS — Z8673 Personal history of transient ischemic attack (TIA), and cerebral infarction without residual deficits: Secondary | ICD-10-CM | POA: Insufficient documentation

## 2018-02-04 DIAGNOSIS — D509 Iron deficiency anemia, unspecified: Secondary | ICD-10-CM | POA: Insufficient documentation

## 2018-02-04 DIAGNOSIS — R011 Cardiac murmur, unspecified: Secondary | ICD-10-CM | POA: Diagnosis not present

## 2018-02-04 DIAGNOSIS — N184 Chronic kidney disease, stage 4 (severe): Secondary | ICD-10-CM | POA: Insufficient documentation

## 2018-02-04 DIAGNOSIS — D631 Anemia in chronic kidney disease: Secondary | ICD-10-CM | POA: Diagnosis present

## 2018-02-04 DIAGNOSIS — R7989 Other specified abnormal findings of blood chemistry: Secondary | ICD-10-CM | POA: Insufficient documentation

## 2018-02-04 LAB — IRON AND TIBC
IRON: 41 ug/dL (ref 28–170)
SATURATION RATIOS: 24 % (ref 10.4–31.8)
TIBC: 172 ug/dL — AB (ref 250–450)
UIBC: 131 ug/dL

## 2018-02-04 LAB — CBC WITH DIFFERENTIAL/PLATELET
BASOS ABS: 0 10*3/uL (ref 0–0.1)
BASOS PCT: 0 %
EOS PCT: 1 %
Eosinophils Absolute: 0 10*3/uL (ref 0–0.7)
HEMATOCRIT: 30.8 % — AB (ref 35.0–47.0)
Hemoglobin: 10.1 g/dL — ABNORMAL LOW (ref 12.0–16.0)
Lymphocytes Relative: 18 %
Lymphs Abs: 0.8 10*3/uL — ABNORMAL LOW (ref 1.0–3.6)
MCH: 31 pg (ref 26.0–34.0)
MCHC: 32.7 g/dL (ref 32.0–36.0)
MCV: 94.7 fL (ref 80.0–100.0)
MONO ABS: 0.4 10*3/uL (ref 0.2–0.9)
MONOS PCT: 8 %
NEUTROS ABS: 3.4 10*3/uL (ref 1.4–6.5)
Neutrophils Relative %: 73 %
PLATELETS: 167 10*3/uL (ref 150–440)
RBC: 3.25 MIL/uL — ABNORMAL LOW (ref 3.80–5.20)
RDW: 15.5 % — AB (ref 11.5–14.5)
WBC: 4.6 10*3/uL (ref 3.6–11.0)

## 2018-02-04 LAB — FERRITIN: FERRITIN: 211 ng/mL (ref 11–307)

## 2018-02-07 ENCOUNTER — Encounter: Payer: Self-pay | Admitting: Oncology

## 2018-02-07 ENCOUNTER — Other Ambulatory Visit: Payer: Self-pay

## 2018-02-07 ENCOUNTER — Inpatient Hospital Stay (HOSPITAL_BASED_OUTPATIENT_CLINIC_OR_DEPARTMENT_OTHER): Payer: Medicare Other | Admitting: Oncology

## 2018-02-07 ENCOUNTER — Inpatient Hospital Stay: Payer: Medicare Other

## 2018-02-07 VITALS — BP 95/59 | HR 80 | Temp 96.0°F | Resp 16

## 2018-02-07 DIAGNOSIS — I129 Hypertensive chronic kidney disease with stage 1 through stage 4 chronic kidney disease, or unspecified chronic kidney disease: Secondary | ICD-10-CM | POA: Diagnosis not present

## 2018-02-07 DIAGNOSIS — D631 Anemia in chronic kidney disease: Secondary | ICD-10-CM

## 2018-02-07 DIAGNOSIS — R7989 Other specified abnormal findings of blood chemistry: Secondary | ICD-10-CM

## 2018-02-07 DIAGNOSIS — Z8673 Personal history of transient ischemic attack (TIA), and cerebral infarction without residual deficits: Secondary | ICD-10-CM

## 2018-02-07 DIAGNOSIS — D509 Iron deficiency anemia, unspecified: Secondary | ICD-10-CM

## 2018-02-07 DIAGNOSIS — R011 Cardiac murmur, unspecified: Secondary | ICD-10-CM

## 2018-02-07 DIAGNOSIS — N184 Chronic kidney disease, stage 4 (severe): Secondary | ICD-10-CM

## 2018-02-07 DIAGNOSIS — D72820 Lymphocytosis (symptomatic): Secondary | ICD-10-CM

## 2018-02-07 NOTE — Progress Notes (Signed)
Hematology/Oncology follow-up note [facility] Telephone:([phone] [phone]   Patient Care Team: Maryland Pink, MD as PCP - General (Family Medicine) Birder Robson, MD as Referring Physician (Ophthalmology)  REFERRING PROVIDER: Dr.Lateef REASON FOR VISIT Follow up for treatment of anemia.   HISTORY OF PRESENTING ILLNESS:  Brianna Coleman is a  77 y.o.  female with PMH listed below who was referred to me for evaluation of anemia. Patient is accompanied by his son who provides most of the history. Patient was recently hospitalized to ICU at [facility] due to subarachnoid hemorrhage after a fall, seizures symptoms. She had a chronic history of anemia and chronic kidney disease and was noticed that her anemia has gotten worse. She was referred to see me for additional workup. Patient reports feeling tired and fatigued. Denies any shortness of breath, chest pain, abdominal pain. She denies any bleeding events or black stools. Patient's son also noticed that her right lower extremity has swelled up. Patient was initially discharged home and advised to use a compression stocking. Patient's son cut off the foot portion and only put in a sleeve on her mother's legs. Since then he has noticed right lower extremity swelling.   INTERVAL HISTORY [name] is a 77 y.o. female who has above history reviewed by me today presents for management of anemia.  Accompanied by patient's son. Previously status post IV Venofer x3 doses.  Fatigue, at patient's baseline.  Not better or worse. Chronic shortness of breath with exertion.  Stable.  Review of Systems  Constitutional: Positive for malaise/fatigue. Negative for chills, fever and weight loss.  HENT: Negative for congestion, ear discharge, ear pain, hearing loss, nosebleeds, sinus pain and sore throat.   Eyes: Negative for blurred vision, double vision, photophobia, pain, discharge and redness.  Respiratory:  Negative for cough, hemoptysis, sputum production, shortness of breath and wheezing.   Cardiovascular: Negative for chest pain, palpitations, orthopnea, claudication and leg swelling.  Gastrointestinal: Negative for abdominal pain, blood in stool, constipation, diarrhea, heartburn, melena, nausea and vomiting.  Genitourinary: Negative for dysuria, flank pain, frequency and hematuria.  Musculoskeletal: Negative for back pain, myalgias and neck pain.  Skin: Negative for itching and rash.  Neurological: Negative for dizziness, tingling, tremors, focal weakness, weakness and headaches.  Endo/Heme/Allergies: Negative for environmental allergies. Does not bruise/bleed easily.  Psychiatric/Behavioral: Negative for depression, hallucinations and substance abuse. The patient is not nervous/anxious.     MEDICAL HISTORY:  Past Medical History:  Diagnosis Date  . Anemia   . Aortic stenosis    mild AS by 03/20/13 echo [facility])  . Arthritis, degenerative   . Blood in stool   . Cancer ([place])    skin cancer  . 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 Chronic renal insufficiency    CKD stage IV, sees Dr Holley Raring in [place]  . Dyslipidemia   . Dysphagia   . Episode of syncope    Near-syncope  . Fall   . Gait disorder   . Gastroesophageal reflux disease   . Heart murmur   . History of blood transfusion   . Hypertension   . Memory disturbance   . Neurogenic bladder   . Orthostatic hypotension 06/17/2017  . Parkinson disease ([place])   . 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 RLS (restless legs syndrome)   . Shortness of breath    with exertion  . Stroke [facility])    no residual  . 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 CAROTID ENDARTERECTOMY    . CATARACT EXTRACTION, BILATERAL    . COLONOSCOPY    . ENDARTERECTOMY Right 09/19/2013   Procedure: ENDARTERECTOMY CAROTID  WITH PATCH ANGIOPLASTY;  Surgeon: Elam Dutch, MD;  Location: [place];  Service: Vascular;  Laterality: Right;  . EYE SURGERY Bilateral    cataract  . UPPER GI ENDOSCOPY    . vaginal cyst removal  2009    SOCIAL HISTORY: Social History   Socioeconomic History  . Marital status: Widowed    Spouse name: Not on file  . Number of children: 2  . Years of education: hs  . Highest education level: Not on file  Occupational History  . Occupation: Retired    Fish farm manager: RETIRED  Social Needs  . Financial resource strain: Not on file  . Food insecurity:    Worry: Not on file    Inability: Not on file  . Transportation needs:    Medical: Not on file    Non-medical: Not on file  Tobacco Use  . Smoking status: Never Smoker  . Smokeless tobacco: Never Used  Substance and Sexual Activity  . Alcohol use: No  . Drug use: No  . Sexual activity: Not on file  Lifestyle  . Physical activity:    Days per week: Not on file    Minutes per session: Not on file  . Stress: Not on file  Relationships  . Social connections:    Talks on phone: Not on file    Gets together: Not on file    Attends religious service: Not on file    Active member of club or organization: Not on file    Attends meetings of clubs or organizations: Not on file    Relationship status: Not on file  . Intimate partner violence:    Fear of current or ex partner: Not on file    Emotionally abused: Not on file    Physically abused: Not on file    Forced sexual activity: Not on file  Other Topics Concern  . Not on file  Social History Narrative   Patient drinks caffeine occasionally.   Patient is right handed.    FAMILY HISTORY: Family History  Problem Relation Age of Onset  . Hypertension Mother   . Heart disease Mother   . Hyperlipidemia Mother   . Breast cancer Mother   . Heart attack Father   . Hypertension Father   . Colon cancer Father   . Lung cancer Father   . Diabetes Brother   . Diabetes Brother    . Heart disease Brother   . Hyperlipidemia Brother   . Hypertension Brother   . Varicose Veins Brother   . Deep vein thrombosis Brother   . Hypertension Sister   . Stomach cancer Maternal Grandmother     ALLERGIES:  has No Known Allergies.  MEDICATIONS:  Current Outpatient Medications  Medication Sig Dispense Refill  . aspirin EC 81 MG tablet Take 81 mg by mouth daily.    Marland Kitchen atorvastatin (LIPITOR) 40 MG tablet Take 80 mg by mouth daily.   2  . Carbidopa-Levodopa ER (SINEMET CR) 25-100 MG tablet controlled release TAKE 1 TABLET BY MOUTH FOUR TIMES A DAY 120 tablet 4  . cholecalciferol (VITAMIN D) 1000 UNITS tablet Take 1,000 Units by mouth daily.    . clopidogrel (PLAVIX) 75 MG tablet Take 1 tablet (75 mg  total) by mouth daily. 30 tablet 1  . conjugated estrogens (PREMARIN) vaginal cream Place 1 Applicatorful vaginally daily as needed.     . docusate sodium (COLACE) 100 MG capsule TAKE 1 CAPSULE (100 MG TOTAL) BY MOUTH DAILY. DO NOT TAKE IF YOU HAVE LOOSE STOOLS 30 capsule 3  . donepezil (ARICEPT) 5 MG tablet Take 1 tablet (5 mg total) by mouth at bedtime. 90 tablet 1  . ferrous gluconate (FERGON) 324 MG tablet TAKE 1 TABLET (324 MG TOTAL) BY MOUTH 2 (TWO) TIMES DAILY WITH A MEAL. 60 tablet 3  . folic acid (FOLVITE) 161 MCG tablet TAKE 1 TABLET (400 MCG TOTAL) BY MOUTH DAILY. 30 tablet 3  . HYDROcodone-acetaminophen (NORCO/VICODIN) 5-325 MG tablet Take 1 tablet every 6 (six) hours as needed by mouth. 90 tablet 0  . NIFEdipine (PROCARDIA-XL/ADALAT CC) 30 MG 24 hr tablet Take 30 mg by mouth daily.  3  . ondansetron (ZOFRAN-ODT) 4 MG disintegrating tablet Take 4 mg by mouth every 8 (eight) hours as needed. for nausea  0  . pantoprazole (PROTONIX) 40 MG tablet Take 40 mg by mouth daily.  11  . pramipexole (MIRAPEX) 1 MG tablet Take 1 mg by mouth 3 (three) times daily.    . QUEtiapine (SEROQUEL) 25 MG tablet Take 1 tablet (25 mg total) by mouth at bedtime. 90 tablet 3  . ranitidine (ZANTAC)  150 MG tablet Take 150 mg by mouth daily.  11   No current facility-administered medications for this visit.      PHYSICAL EXAMINATION: ECOG PERFORMANCE STATUS: 2 - Symptomatic, <50% confined to bed Vitals:   02/07/18 1358  BP: (!) 95/59  Pulse: 80  Resp: 16  Temp: (!) 96 F (35.6 C)   There were no vitals filed for this visit.  Physical Exam  Constitutional: She is oriented to person, place, and time and well-developed, well-nourished, and in no distress. No distress.  Frail appearance elderly female sitting in a wheelchair.  HENT:  Head: Normocephalic and atraumatic.  Nose: Nose normal.  Mouth/Throat: Oropharynx is clear and moist. No oropharyngeal exudate.  Eyes: Pupils are equal, round, and reactive to light. EOM are normal. Left eye exhibits no discharge. No scleral icterus.  Neck: Normal range of motion. Neck supple. No JVD present. No tracheal deviation present.  Cardiovascular: Normal rate and regular rhythm.  Murmur heard. Loud systolic murmur  Pulmonary/Chest: Effort normal and breath sounds normal. No respiratory distress. She has no wheezes. She has no rales. She exhibits no tenderness.  Abdominal: Soft. Bowel sounds are normal. She exhibits no distension and no mass. There is no tenderness. There is no rebound.  Musculoskeletal: Normal range of motion. She exhibits no edema or tenderness.  Lymphadenopathy:    She has no cervical adenopathy.  Neurological: She is alert and oriented to person, place, and time. No cranial nerve deficit. She exhibits normal muscle tone. Coordination normal.  Skin: Skin is warm and dry. No rash noted. She is not diaphoretic. No erythema.  Psychiatric: Affect and judgment normal.     LABORATORY DATA:  I have reviewed the data as listed Lab Results  Component Value Date   WBC 4.6 02/04/2018   HGB 10.1 (L) 02/04/2018   HCT 30.8 (L) 02/04/2018   MCV 94.7 02/04/2018   PLT 167 02/04/2018   Recent Labs    05/24/17 1459  10/26/17 1415 11/30/17 1209  NA 139 141 139  K 4.6 4.3 4.2  CL 108 109 108  CO2 22 27  23  GLUCOSE 108* 131* 155*  BUN 53* 38* 30*  CREATININE 2.88* 2.96* 2.70*  CALCIUM 9.2 9.2 9.2  GFRNONAA 15* 14* 16*  GFRAA 17* 17* 18*  PROT 6.0* 6.7  --   ALBUMIN 3.5 3.8  --   AST 15 19  --   ALT <5* <5*  --   ALKPHOS 53 64  --   BILITOT 0.7 0.7  --   BILIDIR 0.1  --   --   IBILI 0.6  --   --    Negative stool occult, normal folate and B12 levels.  Slightly elevated free light chain ratio at 3 likely secondary to chronic kidney disease.  No monoclonal band on protein electro pheresis. Ferritin 74.    ASSESSMENT & PLAN:  1. 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 Monoclonal B-cell lymphocytosis   3. Heart murmur    #Anemia: Combination of chronic kidney disease and iron deficiency.   Hemoglobin has been holding steadily above 10.  Today's hemoglobin 10.1.  Hold Procrit. Iron panel reviewed, ferritin stable.  Hold additional IV iron at this point.  Recommend patient to restart on maintenance oral iron supplements Repeat CBC, iron TIBC 8 weeks.  Assessment for need for additional IV iron or Procrit.  #Heart murmur, follow-up with cardiology.  Continue folic acid 0.4 mg daily.  #monoclonal B cell lymphocytosis, stable.  Continue to monitor.  No treatment for now.  All questions were answered. The patient knows to call the clinic with any problems questions or concerns.  Return of visit: 8weeks. Total face to face encounter time for this patient visit was15 min. >50% of the time was  spent in counseling and coordination of care.     Earlie Server, MD, PhD Hematology Oncology [facility] at [facility] Pager- [phone] 02/07/2018

## 2018-02-07 NOTE — Progress Notes (Signed)
Patient here today for follow up and possible venofer.

## 2018-02-15 ENCOUNTER — Telehealth: Payer: Self-pay | Admitting: Neurology

## 2018-02-15 NOTE — Telephone Encounter (Signed)
I called and left a message.  The patient is on aspirin and Plavix, generally indomethacin can be used for acute attacks, but probably would not use this if on aspirin and Plavix.  A short course of prednisone can be done, for prevention of the gouty attacks, allopurinol can be given.  The patient needs to go to her primary care physician for this treatment.

## 2018-02-15 NOTE — Telephone Encounter (Signed)
Patient's son [name] (on [place]) calling to find out if there is a medication patient can take for gout that will not interfere with her other medications.

## 2018-02-23 ENCOUNTER — Ambulatory Visit: Payer: Medicare Other | Admitting: Neurology

## 2018-02-26 ENCOUNTER — Other Ambulatory Visit: Payer: Self-pay | Admitting: Oncology

## 2018-02-27 ENCOUNTER — Emergency Department: Payer: Medicare Other

## 2018-02-27 ENCOUNTER — Inpatient Hospital Stay: Payer: Medicare Other

## 2018-02-27 ENCOUNTER — Other Ambulatory Visit: Payer: Self-pay

## 2018-02-27 ENCOUNTER — Encounter: Payer: Self-pay | Admitting: Adult Health

## 2018-02-27 ENCOUNTER — Inpatient Hospital Stay
Admission: EM | Admit: 2018-02-27 | Discharge: 2018-03-22 | DRG: 208 | Disposition: E | Payer: Medicare Other | Attending: Internal Medicine | Admitting: Internal Medicine

## 2018-02-27 DIAGNOSIS — I469 Cardiac arrest, cause unspecified: Secondary | ICD-10-CM | POA: Diagnosis present

## 2018-02-27 DIAGNOSIS — Q231 Congenital insufficiency of aortic valve: Secondary | ICD-10-CM

## 2018-02-27 DIAGNOSIS — I120 Hypertensive chronic kidney disease with stage 5 chronic kidney disease or end stage renal disease: Secondary | ICD-10-CM | POA: Diagnosis present

## 2018-02-27 DIAGNOSIS — D509 Iron deficiency anemia, unspecified: Secondary | ICD-10-CM | POA: Diagnosis present

## 2018-02-27 DIAGNOSIS — E43 Unspecified severe protein-calorie malnutrition: Secondary | ICD-10-CM

## 2018-02-27 DIAGNOSIS — Z66 Do not resuscitate: Secondary | ICD-10-CM | POA: Diagnosis not present

## 2018-02-27 DIAGNOSIS — J810 Acute pulmonary edema: Secondary | ICD-10-CM | POA: Diagnosis present

## 2018-02-27 DIAGNOSIS — F0280 Dementia in other diseases classified elsewhere without behavioral disturbance: Secondary | ICD-10-CM | POA: Diagnosis present

## 2018-02-27 DIAGNOSIS — N182 Chronic kidney disease, stage 2 (mild): Secondary | ICD-10-CM | POA: Diagnosis not present

## 2018-02-27 DIAGNOSIS — I953 Hypotension of hemodialysis: Secondary | ICD-10-CM | POA: Diagnosis not present

## 2018-02-27 DIAGNOSIS — Z0189 Encounter for other specified special examinations: Secondary | ICD-10-CM

## 2018-02-27 DIAGNOSIS — Z7982 Long term (current) use of aspirin: Secondary | ICD-10-CM

## 2018-02-27 DIAGNOSIS — N179 Acute kidney failure, unspecified: Secondary | ICD-10-CM | POA: Diagnosis present

## 2018-02-27 DIAGNOSIS — G20A1 Parkinson's disease without dyskinesia, without mention of fluctuations: Secondary | ICD-10-CM | POA: Diagnosis present

## 2018-02-27 DIAGNOSIS — R680 Hypothermia, not associated with low environmental temperature: Secondary | ICD-10-CM | POA: Diagnosis present

## 2018-02-27 DIAGNOSIS — G4752 REM sleep behavior disorder: Secondary | ICD-10-CM | POA: Diagnosis present

## 2018-02-27 DIAGNOSIS — R579 Shock, unspecified: Secondary | ICD-10-CM | POA: Diagnosis present

## 2018-02-27 DIAGNOSIS — J9600 Acute respiratory failure, unspecified whether with hypoxia or hypercapnia: Secondary | ICD-10-CM

## 2018-02-27 DIAGNOSIS — E559 Vitamin D deficiency, unspecified: Secondary | ICD-10-CM | POA: Diagnosis present

## 2018-02-27 DIAGNOSIS — R109 Unspecified abdominal pain: Secondary | ICD-10-CM

## 2018-02-27 DIAGNOSIS — M545 Low back pain: Secondary | ICD-10-CM | POA: Diagnosis present

## 2018-02-27 DIAGNOSIS — D631 Anemia in chronic kidney disease: Secondary | ICD-10-CM | POA: Diagnosis present

## 2018-02-27 DIAGNOSIS — K219 Gastro-esophageal reflux disease without esophagitis: Secondary | ICD-10-CM | POA: Diagnosis present

## 2018-02-27 DIAGNOSIS — Z9181 History of falling: Secondary | ICD-10-CM

## 2018-02-27 DIAGNOSIS — I214 Non-ST elevation (NSTEMI) myocardial infarction: Secondary | ICD-10-CM | POA: Diagnosis present

## 2018-02-27 DIAGNOSIS — Z515 Encounter for palliative care: Secondary | ICD-10-CM | POA: Diagnosis not present

## 2018-02-27 DIAGNOSIS — Z6821 Body mass index (BMI) 21.0-21.9, adult: Secondary | ICD-10-CM

## 2018-02-27 DIAGNOSIS — K449 Diaphragmatic hernia without obstruction or gangrene: Secondary | ICD-10-CM | POA: Diagnosis present

## 2018-02-27 DIAGNOSIS — Z801 Family history of malignant neoplasm of trachea, bronchus and lung: Secondary | ICD-10-CM

## 2018-02-27 DIAGNOSIS — R34 Anuria and oliguria: Secondary | ICD-10-CM | POA: Diagnosis not present

## 2018-02-27 DIAGNOSIS — T68XXXA Hypothermia, initial encounter: Secondary | ICD-10-CM

## 2018-02-27 DIAGNOSIS — I951 Orthostatic hypotension: Secondary | ICD-10-CM | POA: Diagnosis not present

## 2018-02-27 DIAGNOSIS — Z8249 Family history of ischemic heart disease and other diseases of the circulatory system: Secondary | ICD-10-CM

## 2018-02-27 DIAGNOSIS — Z8349 Family history of other endocrine, nutritional and metabolic diseases: Secondary | ICD-10-CM

## 2018-02-27 DIAGNOSIS — N3281 Overactive bladder: Secondary | ICD-10-CM | POA: Diagnosis present

## 2018-02-27 DIAGNOSIS — J690 Pneumonitis due to inhalation of food and vomit: Secondary | ICD-10-CM | POA: Diagnosis present

## 2018-02-27 DIAGNOSIS — E872 Acidosis, unspecified: Secondary | ICD-10-CM

## 2018-02-27 DIAGNOSIS — I468 Cardiac arrest due to other underlying condition: Secondary | ICD-10-CM | POA: Diagnosis present

## 2018-02-27 DIAGNOSIS — Z7189 Other specified counseling: Secondary | ICD-10-CM | POA: Diagnosis not present

## 2018-02-27 DIAGNOSIS — N189 Chronic kidney disease, unspecified: Secondary | ICD-10-CM | POA: Diagnosis not present

## 2018-02-27 DIAGNOSIS — Z978 Presence of other specified devices: Secondary | ICD-10-CM

## 2018-02-27 DIAGNOSIS — Z7902 Long term (current) use of antithrombotics/antiplatelets: Secondary | ICD-10-CM

## 2018-02-27 DIAGNOSIS — E785 Hyperlipidemia, unspecified: Secondary | ICD-10-CM | POA: Diagnosis present

## 2018-02-27 DIAGNOSIS — R1310 Dysphagia, unspecified: Secondary | ICD-10-CM | POA: Diagnosis present

## 2018-02-27 DIAGNOSIS — I4510 Unspecified right bundle-branch block: Secondary | ICD-10-CM | POA: Diagnosis present

## 2018-02-27 DIAGNOSIS — G8929 Other chronic pain: Secondary | ICD-10-CM | POA: Diagnosis present

## 2018-02-27 DIAGNOSIS — J9602 Acute respiratory failure with hypercapnia: Secondary | ICD-10-CM | POA: Diagnosis present

## 2018-02-27 DIAGNOSIS — Z8673 Personal history of transient ischemic attack (TIA), and cerebral infarction without residual deficits: Secondary | ICD-10-CM

## 2018-02-27 DIAGNOSIS — Z833 Family history of diabetes mellitus: Secondary | ICD-10-CM

## 2018-02-27 DIAGNOSIS — Z79890 Hormone replacement therapy (postmenopausal): Secondary | ICD-10-CM

## 2018-02-27 DIAGNOSIS — G20 Parkinson's disease: Secondary | ICD-10-CM | POA: Diagnosis not present

## 2018-02-27 DIAGNOSIS — Z800 Family history of malignant neoplasm of digestive organs: Secondary | ICD-10-CM

## 2018-02-27 DIAGNOSIS — N2581 Secondary hyperparathyroidism of renal origin: Secondary | ICD-10-CM | POA: Diagnosis present

## 2018-02-27 DIAGNOSIS — J9811 Atelectasis: Secondary | ICD-10-CM | POA: Diagnosis present

## 2018-02-27 DIAGNOSIS — N186 End stage renal disease: Secondary | ICD-10-CM | POA: Diagnosis not present

## 2018-02-27 DIAGNOSIS — J9601 Acute respiratory failure with hypoxia: Secondary | ICD-10-CM | POA: Diagnosis present

## 2018-02-27 DIAGNOSIS — I739 Peripheral vascular disease, unspecified: Secondary | ICD-10-CM | POA: Diagnosis present

## 2018-02-27 DIAGNOSIS — N170 Acute kidney failure with tubular necrosis: Secondary | ICD-10-CM | POA: Diagnosis not present

## 2018-02-27 DIAGNOSIS — J189 Pneumonia, unspecified organism: Secondary | ICD-10-CM

## 2018-02-27 DIAGNOSIS — Z803 Family history of malignant neoplasm of breast: Secondary | ICD-10-CM

## 2018-02-27 DIAGNOSIS — I10 Essential (primary) hypertension: Secondary | ICD-10-CM | POA: Diagnosis present

## 2018-02-27 DIAGNOSIS — Z4659 Encounter for fitting and adjustment of other gastrointestinal appliance and device: Secondary | ICD-10-CM

## 2018-02-27 DIAGNOSIS — Z789 Other specified health status: Secondary | ICD-10-CM

## 2018-02-27 DIAGNOSIS — Z992 Dependence on renal dialysis: Secondary | ICD-10-CM

## 2018-02-27 HISTORY — DX: Gastro-esophageal reflux disease without esophagitis: K21.9

## 2018-02-27 HISTORY — DX: Diaphragmatic hernia without obstruction or gangrene: K44.9

## 2018-02-27 LAB — BLOOD GAS, ARTERIAL
ACID-BASE DEFICIT: 21 mmol/L — AB (ref 0.0–2.0)
Acid-base deficit: 8.4 mmol/L — ABNORMAL HIGH (ref 0.0–2.0)
Acid-base deficit: 4.9 mmol/L — ABNORMAL HIGH (ref 0.0–2.0)
BICARBONATE: 9.4 mmol/L — AB (ref 20.0–28.0)
BICARBONATE: 17.9 mmol/L — AB (ref 20.0–28.0)
Bicarbonate: 20.6 mmol/L (ref 20.0–28.0)
Delivery systems: POSITIVE
Delivery systems: POSITIVE
EXPIRATORY PAP: 5
FIO2: 1
FIO2: 50
FIO2: 0.5
INSPIRATORY PAP: 10
INSPIRATORY PAP: 10
Mechanical Rate: 12
O2 SAT: 88.6 %
O2 Saturation: 92.7 %
O2 Saturation: 92.7 %
PATIENT TEMPERATURE: 37
PCO2 ART: 41 mmHg (ref 32.0–48.0)
PH ART: 6.97 — AB (ref 7.350–7.450)
PO2 ART: 71 mmHg — AB (ref 83.0–108.0)
Patient temperature: 37
Patient temperature: 37
pCO2 arterial: 39 mmHg (ref 32.0–48.0)
pCO2 arterial: 39 mmHg (ref 32.0–48.0)
pH, Arterial: 7.27 — ABNORMAL LOW (ref 7.350–7.450)
pH, Arterial: 7.33 — ABNORMAL LOW (ref 7.350–7.450)
pO2, Arterial: 101 mmHg (ref 83.0–108.0)
pO2, Arterial: 64 mmHg — ABNORMAL LOW (ref 83.0–108.0)

## 2018-02-27 LAB — COMPREHENSIVE METABOLIC PANEL
ALBUMIN: 3.7 g/dL (ref 3.5–5.0)
ALT: 5 U/L (ref 0–44)
ALT: 10 U/L (ref 0–44)
ANION GAP: 17 — AB (ref 5–15)
ANION GAP: 11 (ref 5–15)
AST: 31 U/L (ref 15–41)
AST: 82 U/L — ABNORMAL HIGH (ref 15–41)
Albumin: 3.4 g/dL — ABNORMAL LOW (ref 3.5–5.0)
Alkaline Phosphatase: 81 U/L (ref 38–126)
Alkaline Phosphatase: 76 U/L (ref 38–126)
BILIRUBIN TOTAL: 0.7 mg/dL (ref 0.3–1.2)
BILIRUBIN TOTAL: 0.6 mg/dL (ref 0.3–1.2)
BUN: 45 mg/dL — ABNORMAL HIGH (ref 8–23)
BUN: 52 mg/dL — AB (ref 8–23)
CALCIUM: 8.9 mg/dL (ref 8.9–10.3)
CO2: 14 mmol/L — ABNORMAL LOW (ref 22–32)
CO2: 21 mmol/L — ABNORMAL LOW (ref 22–32)
CREATININE: 3.57 mg/dL — AB (ref 0.44–1.00)
Calcium: 8.2 mg/dL — ABNORMAL LOW (ref 8.9–10.3)
Chloride: 108 mmol/L (ref 98–111)
Chloride: 110 mmol/L (ref 98–111)
Creatinine, Ser: 3.47 mg/dL — ABNORMAL HIGH (ref 0.44–1.00)
GFR calc Af Amer: 13 mL/min — ABNORMAL LOW (ref >60)
GFR calc non Af Amer: 11 mL/min — ABNORMAL LOW (ref >60)
GFR, EST AFRICAN AMERICAN: 14 mL/min — AB (ref >60)
GFR, EST NON AFRICAN AMERICAN: 12 mL/min — AB (ref >60)
GLUCOSE: 340 mg/dL — AB (ref 70–99)
Glucose, Bld: 202 mg/dL — ABNORMAL HIGH (ref 70–99)
POTASSIUM: 3.6 mmol/L (ref 3.5–5.1)
Potassium: 5 mmol/L (ref 3.5–5.1)
Sodium: 139 mmol/L (ref 135–145)
Sodium: 142 mmol/L (ref 135–145)
TOTAL PROTEIN: 6.3 g/dL — AB (ref 6.5–8.1)
TOTAL PROTEIN: 6.2 g/dL — AB (ref 6.5–8.1)

## 2018-02-27 LAB — CBC WITH DIFFERENTIAL/PLATELET
BASOS PCT: 0 %
Basophils Absolute: 0 10*3/uL (ref 0–0.1)
Eosinophils Absolute: 0 10*3/uL (ref 0–0.7)
Eosinophils Relative: 0 %
HCT: 33.3 % — ABNORMAL LOW (ref 35.0–47.0)
HEMOGLOBIN: 10.6 g/dL — AB (ref 12.0–16.0)
LYMPHS ABS: 4.1 10*3/uL — AB (ref 1.0–3.6)
Lymphocytes Relative: 42 %
MCH: 31.9 pg (ref 26.0–34.0)
MCHC: 31.8 g/dL — AB (ref 32.0–36.0)
MCV: 100.5 fL — ABNORMAL HIGH (ref 80.0–100.0)
MONOS PCT: 7 %
Monocytes Absolute: 0.7 10*3/uL (ref 0.2–0.9)
NEUTROS PCT: 51 %
Neutro Abs: 4.9 10*3/uL (ref 1.4–6.5)
Platelets: 134 10*3/uL — ABNORMAL LOW (ref 150–440)
RBC: 3.31 MIL/uL — ABNORMAL LOW (ref 3.80–5.20)
RDW: 17.3 % — ABNORMAL HIGH (ref 11.5–14.5)
WBC: 9.7 10*3/uL (ref 3.6–11.0)

## 2018-02-27 LAB — TROPONIN I
TROPONIN I: 0.07 ng/mL — AB (ref <0.03)
Troponin I: 0.03 ng/mL (ref <0.03)
Troponin I: 0.76 ng/mL (ref <0.03)

## 2018-02-27 LAB — URINALYSIS, ROUTINE W REFLEX MICROSCOPIC
BILIRUBIN URINE: NEGATIVE
Glucose, UA: 150 mg/dL — AB
Ketones, ur: NEGATIVE mg/dL
LEUKOCYTES UA: NEGATIVE
NITRITE: NEGATIVE
PH: 6 (ref 5.0–8.0)
Protein, ur: 100 mg/dL — AB
SPECIFIC GRAVITY, URINE: 1.013 (ref 1.005–1.030)

## 2018-02-27 LAB — LIPASE, BLOOD: Lipase: 85 U/L — ABNORMAL HIGH (ref 11–51)

## 2018-02-27 LAB — T4, FREE: FREE T4: 0.93 ng/dL (ref 0.82–1.77)

## 2018-02-27 LAB — GLUCOSE, CAPILLARY: Glucose-Capillary: 207 mg/dL — ABNORMAL HIGH (ref 70–99)

## 2018-02-27 LAB — MAGNESIUM: MAGNESIUM: 2 mg/dL (ref 1.7–2.4)

## 2018-02-27 LAB — TSH: TSH: 7.805 u[IU]/mL — ABNORMAL HIGH (ref 0.350–4.500)

## 2018-02-27 LAB — PROTIME-INR
INR: 1.11
PROTHROMBIN TIME: 14.2 s (ref 11.4–15.2)

## 2018-02-27 LAB — PHOSPHORUS: Phosphorus: 4.1 mg/dL (ref 2.5–4.6)

## 2018-02-27 MED ORDER — SODIUM CHLORIDE 0.9 % IV SOLN
1.0000 g | Freq: Once | INTRAVENOUS | Status: AC
  Administered 2018-02-27: 1 g via INTRAVENOUS
  Filled 2018-02-27: qty 1

## 2018-02-27 MED ORDER — HEPARIN SODIUM (PORCINE) 5000 UNIT/ML IJ SOLN
5000.0000 [IU] | Freq: Three times a day (TID) | INTRAMUSCULAR | Status: DC
  Administered 2018-02-28 – 2018-03-10 (×30): 5000 [IU] via SUBCUTANEOUS
  Filled 2018-02-27 (×30): qty 1

## 2018-02-27 MED ORDER — SODIUM CHLORIDE 0.9 % IV SOLN
INTRAVENOUS | Status: AC | PRN
  Administered 2018-02-27 (×2): 1000 mL via INTRAVENOUS

## 2018-02-27 MED ORDER — EPINEPHRINE PF 1 MG/10ML IJ SOSY
PREFILLED_SYRINGE | INTRAMUSCULAR | Status: AC | PRN
  Administered 2018-02-27: 0.1 mg via INTRAVENOUS

## 2018-02-27 MED ORDER — CLOPIDOGREL BISULFATE 75 MG PO TABS
75.0000 mg | ORAL_TABLET | Freq: Every day | ORAL | Status: DC
  Administered 2018-02-28 – 2018-03-03 (×4): 75 mg via ORAL
  Filled 2018-02-27 (×4): qty 1

## 2018-02-27 MED ORDER — PRAMIPEXOLE DIHYDROCHLORIDE 1 MG PO TABS
1.0000 mg | ORAL_TABLET | Freq: Three times a day (TID) | ORAL | Status: DC
  Administered 2018-02-28 – 2018-03-03 (×10): 1 mg via ORAL
  Filled 2018-02-27 (×13): qty 1

## 2018-02-27 MED ORDER — ATORVASTATIN CALCIUM 20 MG PO TABS
80.0000 mg | ORAL_TABLET | Freq: Every evening | ORAL | Status: DC
  Administered 2018-02-28 – 2018-03-03 (×3): 80 mg via ORAL
  Filled 2018-02-27 (×3): qty 4

## 2018-02-27 MED ORDER — ACETAMINOPHEN 325 MG PO TABS: 650.0000 mg | ORAL_TABLET | ORAL | Status: DC | PRN

## 2018-02-27 MED ORDER — SODIUM BICARBONATE 8.4 % IV SOLN
50.0000 meq | Freq: Once | INTRAVENOUS | Status: AC
  Administered 2018-02-27: 50 meq via INTRAVENOUS

## 2018-02-27 MED ORDER — SODIUM CHLORIDE 0.9 % IV BOLUS
100.0000 mL | Freq: Once | INTRAVENOUS | Status: AC
  Administered 2018-02-27: 100 mL via INTRAVENOUS

## 2018-02-27 MED ORDER — CARBIDOPA-LEVODOPA ER 25-100 MG PO TBCR
1.0000 | EXTENDED_RELEASE_TABLET | Freq: Four times a day (QID) | ORAL | Status: DC
  Administered 2018-02-28 (×3): 1 via ORAL
  Filled 2018-02-27 (×7): qty 1

## 2018-02-27 MED ORDER — FENTANYL CITRATE (PF) 100 MCG/2ML IJ SOLN
12.5000 ug | Freq: Once | INTRAMUSCULAR | Status: AC
  Administered 2018-02-28: 12.5 ug via INTRAVENOUS
  Filled 2018-02-27: qty 2

## 2018-02-27 MED ORDER — PANTOPRAZOLE SODIUM 40 MG PO TBEC: 40.0000 mg | DELAYED_RELEASE_TABLET | Freq: Every day | ORAL | Status: DC

## 2018-02-27 MED ORDER — NOREPINEPHRINE 4 MG/250ML-% IV SOLN: 0.0000 ug/min | Freq: Once | INTRAVENOUS | Status: DC

## 2018-02-27 MED ORDER — ASPIRIN 81 MG PO CHEW: 324.0000 mg | CHEWABLE_TABLET | ORAL | Status: AC

## 2018-02-27 MED ORDER — ONDANSETRON HCL 4 MG/2ML IJ SOLN
4.0000 mg | Freq: Once | INTRAMUSCULAR | Status: AC
  Administered 2018-02-27: 4 mg via INTRAVENOUS
  Filled 2018-02-27: qty 2

## 2018-02-27 MED ORDER — ASPIRIN 300 MG RE SUPP
300.0000 mg | RECTAL | Status: AC
  Administered 2018-02-28: 300 mg via RECTAL
  Filled 2018-02-27: qty 1

## 2018-02-27 MED ORDER — SODIUM CHLORIDE 0.9 % IV SOLN
250.0000 mL | INTRAVENOUS | Status: DC | PRN
  Administered 2018-02-28: 500 mL via INTRAVENOUS
  Administered 2018-03-08: 250 mL via INTRAVENOUS

## 2018-02-27 MED ORDER — ASPIRIN EC 81 MG PO TBEC: 81.0000 mg | DELAYED_RELEASE_TABLET | Freq: Every day | ORAL | Status: DC

## 2018-02-27 MED ORDER — PIPERACILLIN-TAZOBACTAM 3.375 G IVPB
3.3750 g | Freq: Two times a day (BID) | INTRAVENOUS | Status: DC
  Administered 2018-02-28: 3.375 g via INTRAVENOUS
  Filled 2018-02-27: qty 50

## 2018-02-27 MED ORDER — IPRATROPIUM-ALBUTEROL 0.5-2.5 (3) MG/3ML IN SOLN
3.0000 mL | RESPIRATORY_TRACT | Status: DC
  Administered 2018-02-27 – 2018-03-02 (×14): 3 mL via RESPIRATORY_TRACT
  Filled 2018-02-27 (×15): qty 3

## 2018-02-27 NOTE — ED Notes (Signed)
Patient transported to CT 

## 2018-02-27 NOTE — ED Notes (Signed)
Initial temperature 89 F. Md notified. Bear hugger applied.

## 2018-02-27 NOTE — Consult Note (Signed)
PULMONARY / CRITICAL CARE MEDICINE   Name: Brianna Coleman MRN: [id number] DOB: 1941/04/17    ADMISSION DATE:  03/13/2018   CONSULTATION DATE:  02/21/2018  REFERRING MD:  Dr Jannifer Franklin  REASON: Cardiac arrest and epigastric pain  HISTORY OF PRESENT ILLNESS:   This is a 77 y/o female who presented to the ED via EMS for worsening abdominal, nausea and vomiting that started about a week ago. History is obtained from patient's son as she os currently on BiPAP. Patient's son states that she has been c/o epigastric and back pain. She usually has back pain and abdominal pain at baseline but symptoms have gotten worse over time hence he decided to call EMS.  When patient got to the ED she coded as she was being transferred from the EMS bed to the ED bed.  She received 1 dose of sodium bicarb and 1 epinephrine alongside chest compressions with return of spontaneous circulation.  She regained consciousness hence she was not intubated.  She had one episode of emesis in the ED.  She has been placed on BiPAP and doing well on BiPAP.  She is being admitted to the ICU for further management.  Her chest x-ray showed large large hiatal hernia, bibasilar atelectasis and pleural effusions and vascular congestion.  Her abdominal x-ray showed gaseous distention of the stomach and small bowel with no obstruction.  Her labs showed a creatinine of 3.57, and a CO2 of 21.  Her troponin is currently 0.07.  PAST MEDICAL HISTORY :  She  has a past medical history of Anemia, Aortic stenosis, Arthritis, degenerative, Blood in stool, Cancer ([place]), Cervicogenic headache (02/05/2016), Chronic renal insufficiency, Dyslipidemia, Dysphagia, Episode of syncope, Fall, Gait disorder, Gastroesophageal reflux disease, Heart murmur, History of blood transfusion, Hypertension, Memory disturbance, Neurogenic bladder, Orthostatic hypotension (06/17/2017), Parkinson disease ([place]), REM sleep behavior disorder (03/05/2014), RLS (restless legs syndrome),  Shortness of breath, Stroke ([place]), and Stroke due to embolism of right carotid artery ([place]) (09/06/2015).  PAST SURGICAL HISTORY: She  has a past surgical history that includes Cataract extraction, bilateral; vaginal cyst removal (2009); Colonoscopy; Upper gi endoscopy; Eye surgery (Bilateral); Endarterectomy (Right, 09/19/2013); and Carotid endarterectomy.  No Known Allergies  No current facility-administered medications on file prior to encounter.    Current Outpatient Medications on File Prior to Encounter  Medication Sig  . allopurinol (ZYLOPRIM) 100 MG tablet Take 100 mg by mouth daily.  Marland Kitchen aspirin EC 81 MG tablet Take 81 mg by mouth daily.  Marland Kitchen atorvastatin (LIPITOR) 80 MG tablet Take 80 mg by mouth every evening.  . Carbidopa-Levodopa ER (SINEMET CR) 25-100 MG tablet controlled release TAKE 1 TABLET BY MOUTH FOUR TIMES A DAY  . cholecalciferol (VITAMIN D) 1000 UNITS tablet Take 1,000 Units by mouth daily.  . clopidogrel (PLAVIX) 75 MG tablet Take 1 tablet (75 mg total) by mouth daily.  Marland Kitchen conjugated estrogens (PREMARIN) vaginal cream Place 1 Applicatorful vaginally daily as needed.   . docusate sodium (COLACE) 100 MG capsule TAKE 1 CAPSULE (100 MG TOTAL) BY MOUTH DAILY. DO NOT TAKE IF YOU HAVE LOOSE STOOLS  . donepezil (ARICEPT) 5 MG tablet Take 1 tablet (5 mg total) by mouth at bedtime.  . ferrous gluconate (FERGON) 324 MG tablet TAKE 1 TABLET (324 MG TOTAL) BY MOUTH 2 (TWO) TIMES DAILY WITH A MEAL.  . folic acid (FOLVITE) 768 MCG tablet TAKE 1 TABLET (400 MCG TOTAL) BY MOUTH DAILY.  Marland Kitchen NIFEdipine (PROCARDIA-XL/ADALAT CC) 30 MG 24 hr tablet Take 30 mg  by mouth daily.  . ondansetron (ZOFRAN-ODT) 4 MG disintegrating tablet Take 4 mg by mouth every 8 (eight) hours as needed for nausea.   . pantoprazole (PROTONIX) 40 MG tablet Take 40 mg by mouth daily.  . pramipexole (MIRAPEX) 1 MG tablet Take 1 mg by mouth 3 (three) times daily.  . QUEtiapine (SEROQUEL) 25 MG tablet Take 1 tablet (25 mg  total) by mouth at bedtime.  . ranitidine (ZANTAC) 150 MG tablet Take 150 mg by mouth daily.    FAMILY HISTORY:  Her family history includes Breast cancer in her mother; Colon cancer in her father; Deep vein thrombosis in her brother; Diabetes in her brother and brother; Heart attack in her father; Heart disease in her brother and mother; Hyperlipidemia in her brother and mother; Hypertension in her brother, father, mother, and sister; Lung cancer in her father; Stomach cancer in her maternal grandmother; Varicose Veins in her brother.  SOCIAL HISTORY: She  reports that she has never smoked. She has never used smokeless tobacco. She reports that she does not drink alcohol or use drugs.  REVIEW OF SYSTEMS:   Able to obtain as patient is currently on continuous BiPAP  SUBJECTIVE:    VITAL SIGNS: BP 111/63 (BP Location: Left Arm)   Pulse (!) 116   Temp 98.4 F (36.9 C) (Bladder)   Resp (!) 23   Ht 5\' 3"  (1.6 m)   Wt 55.3 kg   SpO2 91%   BMI 21.60 kg/m   HEMODYNAMICS:    VENTILATOR SETTINGS: FiO2 (%):  [50 %] 50 %  INTAKE / OUTPUT: No intake/output data recorded.  PHYSICAL EXAMINATION: General: Moderate respiratory distress Neuro: Alert and oriented x1, follows commands, cranial nerves intact HEENT: PERRLA, no JVD Cardiovascular: Apical pulse tachycardic, irregular, S1-S2, high-grade diastolic murmur Lungs: Bilateral breath sounds, diminished in the bases, no wheezes or rhonchi Abdomen: Nondistended, normal bowel sounds in all 4 quadrants Musculoskeletal: Positive range of motion no deformities Skin: Warm and dry  LABS:  BMET Recent Labs  Lab 03/08/2018 1841  NA 139  K 5.0  CL 108  CO2 14*  BUN 45*  CREATININE 3.57*  GLUCOSE 340*    Electrolytes Recent Labs  Lab 03/02/2018 1841  CALCIUM 8.9    CBC Recent Labs  Lab 03/02/2018 1841  WBC 9.7  HGB 10.6*  HCT 33.3*  PLT 134*    Coag's No results for input(s): APTT, INR in the last 168 hours.  Sepsis  Markers No results for input(s): LATICACIDVEN, PROCALCITON, O2SATVEN in the last 168 hours.  ABG Recent Labs  Lab 03/06/2018 1851 03/18/2018 1951  PHART 6.97* 7.27*  PCO2ART 41 39  PO2ART 101 64*    Liver Enzymes Recent Labs  Lab 02/25/2018 1841  AST 31  ALT 5  ALKPHOS 81  BILITOT 0.7  ALBUMIN 3.7    Cardiac Enzymes Recent Labs  Lab 03/13/2018 1841 03/06/2018 2043  TROPONINI 0.03* 0.07*    Glucose No results for input(s): GLUCAP in the last 168 hours.  Imaging Ct Head Wo Contrast  Result Date: 03/01/2018 CLINICAL DATA:  Altered level of consciousness, unexplained. EXAM: CT HEAD WITHOUT CONTRAST TECHNIQUE: Contiguous axial images were obtained from the base of the skull through the vertex without intravenous contrast. COMPARISON:  Head CT 11/30/2017 FINDINGS: Brain: No intracranial hemorrhage, mass effect, or midline shift. Unchanged atrophy and chronic small vessel ischemia. Unchanged remote lacunar infarcts in the right greater than left basal ganglia. Small remote left parietal infarct again seen. No cerebral  edema. No hydrocephalus. The basilar cisterns are patent. No evidence of territorial infarct or acute ischemia. No extra-axial or intracranial fluid collection. Vascular: Atherosclerosis of skullbase vasculature without hyperdense vessel or abnormal calcification. Skull: No fracture or focal lesion. Sinuses/Orbits: Paranasal sinuses and mastoid air cells are clear. The visualized orbits are unremarkable. Other: None. IMPRESSION: 1.  No acute intracranial abnormality. 2. Unchanged atrophy, chronic small vessel ischemia, and remote lacunar infarcts in the basal ganglia. Electronically Signed   By: Keith Rake M.D.   On: 03/20/2018 21:08   Dg Chest Portable 1 View  Result Date: 03/16/2018 CLINICAL DATA:  Respiratory distress. EXAM: PORTABLE CHEST 1 VIEW COMPARISON:  12/18/2016. FINDINGS: Stable enlarged cardiac silhouette. Interval gaseous distention of the stomach, including a  large hiatal hernia. Interval linear atelectasis at the right lung base. Clear left lung. Diffuse osteopenia. IMPRESSION: 1. Interval gaseous distention of the stomach, including a large hiatal hernia. 2. Interval linear atelectasis at the right lung base. Electronically Signed   By: Claudie Revering M.D.   On: 03/06/2018 19:18    STUDIES:  2D echo pending  CULTURES: Cultures x2 pending  ANTIBIOTICS: Given 1 dose of cefepime in the ED Started on Zosyn for aspiration pneumonitis  ASSESSMENT Cardiac arrest  Acute pulmonary edema Possible aspiration into airway from nausea and vomiting Acute on chronic renal failure Parkinson's disease  PLAN Hemodynamic monitoring per ICU protocol Continuous BiPAP and titrate to nasal cannula as tolerated Cardiology consult Cycle cardiac enzymes Antibiotics as above to cover for aspiration pneumonia We Coleman hold off on IV diuresis at this point given worsening kidney function and stable SPO2 Trend creatinine Trend procalcitonin and adjust antibiotics accordingly Resume all home medications GI and DVT prophylaxis  FAMILY  - Updates: Spoke at length with patient's family regarding her CODE STATUS.  After consultation they decided to make keep her as a full code.  Magdalene S. Detar North ANP-BC Pulmonary and Critical Care Medicine [facility] Pager [phone] or [phone]  NB: This document was prepared using Dragon voice recognition software and may include unintentional dictation errors.  7  02/24/2018, 11:02 PM

## 2018-02-27 NOTE — ED Provider Notes (Signed)
[facility] Emergency Department Provider Note   ____________________________________________   First MD Initiated Contact with Patient 02/26/2018 1907     (approximate)  I have reviewed the triage vital signs and the nursing notes.   HISTORY  Chief Complaint Cardiac Arrest Chief complaint is cardiac arrest  HPI Brianna Coleman is a 77 y.o. female who has a history of renal disease stage IV parkinsonism and orthostatic hypotension which manifests is sweatiness and feeling bad.  Patient reported by family to have been having bad burning pain in her epigastric area off and on for the last several days with episodes of sweating.  Pain did not seem to radiate anywhere else.  Today patient began to have more epigastric pain and a lot of sweating more than usual.  When she got a bad headache so they called EMS and EMS brought her to the emergency room as EMS was transferring the patient onto the bed she lost her pulse and became unresponsive.  CPR was started immediately as I walked into the room and IO was started in the right lower leg and 1 amp of epi was given.  Patient was given bag-valve-mask and she vomited once but was immediately suctioned.  Abrasions were made to intubate her when she began breathing spontaneously.  Review of EMS EKG showed that 1 of the 2 had some ST elevation in lead I and L.  EKG done in the emergency room after pulse returned did not show this.  I discussed the patient with Dr. Saralyn Pilar who is actually her regular cardiologist.  He felt that as EMS EKGs are not very accurate he would not act on that EKG as the first hospital EKG only showed some ST segment depression in the second hospital EKG only showed sinus tachycardia at 107 and suffer right bundle branch block was essentially normal.  At this time patient is awake will squeeze both hands in response to command.  She will not move her legs.  Family reports that her parkinsonism occasionally  results in her getting anxious and not being able to move her legs but she usually can move her hands if she has 1 of those stop and go episodes.  Patient has had subarachnoid hemorrhage after fall and strokes/TIAs.   Past Medical History:  Diagnosis Date  . Anemia   . Aortic stenosis    mild AS by 03/20/13 echo [facility])  . Arthritis, degenerative   . Blood in stool   . Cancer ([place])    skin cancer  . 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 Chronic renal insufficiency    CKD stage IV, sees Dr Holley Raring in [place]  . Dyslipidemia   . Dysphagia   . Episode of syncope    Near-syncope  . Fall   . Gait disorder   . Gastroesophageal reflux disease   . Heart murmur   . History of blood transfusion   . Hypertension   . Memory disturbance   . Neurogenic bladder   . Orthostatic hypotension 06/17/2017  . Parkinson disease ([place])   . 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 RLS (restless legs syndrome)   . Shortness of breath    with exertion  . Stroke [facility])    no residual  . 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 Anemia in stage 4 chronic kidney disease ([place]) 11/08/2017  .  Heart murmur 11/08/2017  . 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 Anemia 07/30/2017  . Orthostatic hypotension 06/17/2017  . TIA (transient ischemic attack) 05/24/2017  . Hallucinations 04/26/2017  . Apraxia 12/21/2016  . Cervicogenic headache 02/05/2016  . 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 Cerebrovascular accident (CVA) ([place]) 08/12/2015  . Transient alteration of awareness 07/31/2015  . 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 Absolute anemia 01/26/2014  . Aortic valve defect 01/26/2014  . Congestive heart failure ([place]) 01/26/2014  . HLD (hyperlipidemia) 01/26/2014  . Pure hypercholesterolemia 01/26/2014  . Avitaminosis D 01/26/2014  . Carotid artery  stenosis 09/19/2013  . Carotid artery obstruction 09/19/2013  . Occlusion and stenosis of carotid artery without mention of cerebral infarction 09/14/2013  . Paralysis agitans ([place]) 10/04/2012  . Abnormality of gait 10/04/2012  . Parkinson's disease ([place]) 10/04/2012  . Memory loss 06/21/2012  . Dysphagia, unspecified(787.20) 06/21/2012  . Unspecified essential hypertension 06/21/2012  . Other and unspecified hyperlipidemia 06/21/2012  . Unspecified cataract 06/21/2012  . Cataract 06/21/2012  . Can't get food down 06/21/2012  . Essential (primary) hypertension 06/21/2012    Past Surgical History:  Procedure Laterality Date  . CAROTID ENDARTERECTOMY    . CATARACT EXTRACTION, BILATERAL    . COLONOSCOPY    . ENDARTERECTOMY Right 09/19/2013   Procedure: ENDARTERECTOMY CAROTID WITH PATCH ANGIOPLASTY;  Surgeon: Elam Dutch, MD;  Location: [place];  Service: Vascular;  Laterality: Right;  . EYE SURGERY Bilateral    cataract  . UPPER GI ENDOSCOPY    . vaginal cyst removal  2009    Prior to Admission medications   Medication Sig Start Date End Date Taking? Authorizing Provider  allopurinol (ZYLOPRIM) 100 MG tablet Take 100 mg by mouth daily. 02/17/18  Yes [provider]  aspirin EC 81 MG tablet Take 81 mg by mouth daily.   Yes [provider]  atorvastatin (LIPITOR) 80 MG tablet Take 80 mg by mouth every evening. 02/15/18  Yes [provider]  Carbidopa-Levodopa ER (SINEMET CR) 25-100 MG tablet controlled release TAKE 1 TABLET BY MOUTH FOUR TIMES A DAY 10/27/17  Yes Kathrynn Ducking, MD  cholecalciferol (VITAMIN D) 1000 UNITS tablet Take 1,000 Units by mouth daily.   Yes [provider]  clopidogrel (PLAVIX) 75 MG tablet Take 1 tablet (75 mg total) by mouth daily. 05/25/17  Yes Henreitta Leber, MD  conjugated estrogens (PREMARIN) vaginal cream Place 1 Applicatorful vaginally daily as needed.    Yes [provider]  docusate sodium (COLACE) 100  MG capsule TAKE 1 CAPSULE (100 MG TOTAL) BY MOUTH DAILY. DO NOT TAKE IF YOU HAVE LOOSE STOOLS 11/03/17  Yes Earlie Server, MD  donepezil (ARICEPT) 5 MG tablet Take 1 tablet (5 mg total) by mouth at bedtime. 10/25/17  Yes Kathrynn Ducking, MD  ferrous gluconate (FERGON) 324 MG tablet TAKE 1 TABLET (324 MG TOTAL) BY MOUTH 2 (TWO) TIMES DAILY WITH A MEAL. 11/03/17  Yes Earlie Server, MD  folic acid (FOLVITE) 893 MCG tablet TAKE 1 TABLET (400 MCG TOTAL) BY MOUTH DAILY. 11/03/17  Yes Earlie Server, MD  NIFEdipine (PROCARDIA-XL/ADALAT CC) 30 MG 24 hr tablet Take 30 mg by mouth daily. 09/07/17  Yes [provider]  ondansetron (ZOFRAN-ODT) 4 MG disintegrating tablet Take 4 mg by mouth every 8 (eight) hours as needed for nausea.  09/20/17  Yes [provider]  pantoprazole (PROTONIX) 40 MG tablet Take 40 mg by mouth daily. 09/07/17  Yes [provider]  pramipexole (MIRAPEX) 1 MG tablet Take 1 mg by mouth 3 (three) times daily.   Yes [provider]  QUEtiapine (SEROQUEL) 25 MG tablet Take 1 tablet (25 mg total) by mouth at bedtime. 01/11/18  Yes Kathrynn Ducking, MD  ranitidine (ZANTAC) 150 MG tablet Take 150 mg by mouth daily. 06/29/17  Yes [provider]    Allergies Patient has no known allergies.  Family History  Problem Relation Age of Onset  . Hypertension Mother   . Heart disease Mother   . Hyperlipidemia Mother   . Breast cancer Mother   . Heart attack Father   . Hypertension Father   . Colon cancer Father   . Lung cancer Father   . Diabetes Brother   . Diabetes Brother   . Heart disease Brother   . Hyperlipidemia Brother   . Hypertension Brother   . Varicose Veins Brother   . Deep vein thrombosis Brother   . Hypertension Sister   . Stomach cancer Maternal Grandmother     Social History Social History   Tobacco Use  . Smoking status: Never Smoker  . Smokeless tobacco: Never Used  Substance Use Topics  . Alcohol use: No  . Drug use: No    Review of  Systems Unable to obtain due to cardiac arrest and and patient's inability to speak  ____________________________________________   PHYSICAL EXAM:  VITAL SIGNS: ED Triage Vitals  Enc Vitals Group     BP 03/17/2018 1840 (!) 210/78     Pulse Rate 02/22/2018 1840 (!) 130     Resp 03/21/2018 1851 20     Temp --      Temp src --      SpO2 03/18/2018 1840 100 %     Weight --      Height --      Head Circumference --      Peak Flow --      Pain Score --      Pain Loc --      Pain Edu? --      Excl. in [place]? --     Constitutional: Alert and following commands with her arms after these CPR Eyes: Conjunctivae are normal. PERRL. EOMI. patient seems to have some difficulty looking way to the right but she does on occasion do this. Head: Atraumatic. Nose: No congestion/rhinnorhea. Mouth/Throat: Mucous membranes are moist.  Oropharynx non-erythematous. Neck: No stridor.   Cardiovascular: Normal rate, regular rhythm. Grossly normal heart sounds.  Good peripheral circulation. Respiratory: Normal respiratory effort.  No retractions. Lungs CTAB very difficult to hear there [place] to the noise in the room. Gastrointestinal: Soft and nontender. No distention. No abdominal bruits. No CVA tenderness. Musculoskeletal: No lower extremity tenderness nor edema.   Neurologic: See H PI for description Skin:  Skin is warm, dry and intact. No rash noted.   ____________________________________________   LABS (all labs ordered are listed, but only abnormal results are displayed)  Labs Reviewed  TROPONIN I - Abnormal; Notable for the following components:      Result Value   Troponin I 0.03 (*)    All other components within normal limits  CBC WITH DIFFERENTIAL/PLATELET - Abnormal; Notable for the following components:   RBC 3.31 (*)    Hemoglobin 10.6 (*)    HCT 33.3 (*)    MCV 100.5 (*)    MCHC 31.8 (*)    RDW 17.3 (*)    Platelets 134 (*)  Lymphs Abs 4.1 (*)    All other components within normal  limits  COMPREHENSIVE METABOLIC PANEL - Abnormal; Notable for the following components:   CO2 14 (*)    Glucose, Bld 340 (*)    BUN 45 (*)    Creatinine, Ser 3.57 (*)    Total Protein 6.3 (*)    GFR calc non Af Amer 11 (*)    GFR calc Af Amer 13 (*)    Anion gap 17 (*)    All other components within normal limits  LIPASE, BLOOD - Abnormal; Notable for the following components:   Lipase 85 (*)    All other components within normal limits  BLOOD GAS, ARTERIAL - Abnormal; Notable for the following components:   pH, Arterial 6.97 (*)    Bicarbonate 9.4 (*)    Acid-base deficit 21.0 (*)    All other components within normal limits  TSH - Abnormal; Notable for the following components:   TSH 7.805 (*)    All other components within normal limits  BLOOD GAS, ARTERIAL - Abnormal; Notable for the following components:   pH, Arterial 7.27 (*)    pO2, Arterial 64 (*)    Bicarbonate 17.9 (*)    Acid-base deficit 8.4 (*)    All other components within normal limits  CULTURE, BLOOD (ROUTINE X 2)  CULTURE, BLOOD (ROUTINE X 2)  TROPONIN I   ____________________________________________  EKG  EKG #1 shows a rate of 149 with frequent PVCs and PACs right bundle branch block and left anterior hemiblock.  There is ST segment depression in V4 5 and 6.  There is a very irregular baseline EKG #2 shows sinus tachycardia rate of 107 left axis deviation occasional PVCs right bundle branch block no acute ST-T segment changes ____________________________________________  RADIOLOGY  ED MD interpretation: Chest x-ray shows a massively distended stomach and apparent atelectasis in the right base  Official radiology report(s): Ct Head Wo Contrast  Result Date: 02/24/2018 CLINICAL DATA:  Altered level of consciousness, unexplained. EXAM: CT HEAD WITHOUT CONTRAST TECHNIQUE: Contiguous axial images were obtained from the base of the skull through the vertex without intravenous contrast. COMPARISON:  Head CT  11/30/2017 FINDINGS: Brain: No intracranial hemorrhage, mass effect, or midline shift. Unchanged atrophy and chronic small vessel ischemia. Unchanged remote lacunar infarcts in the right greater than left basal ganglia. Small remote left parietal infarct again seen. No cerebral edema. No hydrocephalus. The basilar cisterns are patent. No evidence of territorial infarct or acute ischemia. No extra-axial or intracranial fluid collection. Vascular: Atherosclerosis of skullbase vasculature without hyperdense vessel or abnormal calcification. Skull: No fracture or focal lesion. Sinuses/Orbits: Paranasal sinuses and mastoid air cells are clear. The visualized orbits are unremarkable. Other: None. IMPRESSION: 1.  No acute intracranial abnormality. 2. Unchanged atrophy, chronic small vessel ischemia, and remote lacunar infarcts in the basal ganglia. Electronically Signed   By: Keith Rake M.D.   On: 03/14/2018 21:08   Dg Chest Portable 1 View  Result Date: 02/21/2018 CLINICAL DATA:  Respiratory distress. EXAM: PORTABLE CHEST 1 VIEW COMPARISON:  12/18/2016. FINDINGS: Stable enlarged cardiac silhouette. Interval gaseous distention of the stomach, including a large hiatal hernia. Interval linear atelectasis at the right lung base. Clear left lung. Diffuse osteopenia. IMPRESSION: 1. Interval gaseous distention of the stomach, including a large hiatal hernia. 2. Interval linear atelectasis at the right lung base. Electronically Signed   By: Claudie Revering M.D.   On: 02/23/2018 19:18    ____________________________________________   PROCEDURES  Procedure(s) performed:   Procedures  Critical Care performed: Critical care time 50 minutes.  This includes monitoring the patient adjusting her lower epinephrine speaking to the family Dr. Henrene Pastor shows the cardiologist and hospital box.  ____________________________________________   INITIAL IMPRESSION / [place] / ED COURSE  Patient was noted to be  hypothermic with initial temperature of 89.  She blood gas showed her to be acidotic.  She did well on BiPAP however awake but did not is now speaking and moving all her extremities equally and well blood pressures come up temperatures come up I did give her some antibiotics in case this was a manifestation of sepsis.  We will admit her.         ____________________________________________   FINAL CLINICAL IMPRESSION(S) / ED DIAGNOSES  Final diagnoses:  Cardiopulmonary arrest with successful resuscitation ([place])  Hypothermia, initial encounter  Acidosis     ED Discharge Orders    None       Note:  This document was prepared using Dragon voice recognition software and may include unintentional dictation errors.    Nena Polio, MD 03/01/2018 2116

## 2018-02-27 NOTE — ED Notes (Signed)
Date and time results received: 03/05/2018 19:20 (use smartphrase ".now" to insert current time)  Test: troponin Critical Value: 0.03  Name of Provider Notified: Dr. Cinda Quest  Orders Received? Or Actions Taken?: acknowledged

## 2018-02-27 NOTE — Progress Notes (Signed)
Pharmacy Antibiotic Note  Brianna Coleman is a 77 y.o. female admitted on 02/28/2018 with aspiration pneumonia.  Pharmacy has been consulted for Zosyn dosing.  Plan: Zosyn 3.375 grams q 12 hours ordered.  Height: 5\' 3"  (160 cm) Weight: 121 lb 14.6 oz (55.3 kg) IBW/kg (Calculated) : 52.4  Temp (24hrs), Avg:95.7 F (35.4 C), Min:92.7 F (33.7 C), Max:99.1 F (37.3 C)  Recent Labs  Lab 03/17/2018 1841 03/04/2018 2314  WBC 9.7  --   CREATININE 3.57* 3.47*    Estimated Creatinine Clearance: 11.1 mL/min (A) (by C-G formula based on SCr of 3.47 mg/dL (H)).    No Known Allergies  Antimicrobials this admission: Cefepime x1; Zosyn 9/9  >>    >>   Dose adjustments this admission:   Microbiology results: 9/8 BCx: pending 9/8 MRSA PCR: pending      9/8 UA: LE(-) NO2(-)  WBC 11-20 Thank you for allowing pharmacy to be a part of this patient's care.  Travonta Gill S 02/26/2018 11:49 PM

## 2018-02-27 NOTE — ED Notes (Signed)
Malinda VO for 2 versed and 100 succ

## 2018-02-27 NOTE — ED Notes (Signed)
Admitting md at bedside

## 2018-02-27 NOTE — ED Notes (Signed)
Report given to Michelle RN

## 2018-02-27 NOTE — ED Notes (Signed)
Patient returned from ct

## 2018-02-27 NOTE — ED Notes (Signed)
VO Malinda - Levophed 12mcg

## 2018-02-27 NOTE — ED Notes (Signed)
Per Malinda stop all NACL fluids

## 2018-02-27 NOTE — ED Notes (Signed)
Malinda VO for 2 versed and 100 succ HOLD

## 2018-02-27 NOTE — H&P (Signed)
[facility] NAME: Brianna Coleman    MR#:  [id number]  DATE OF BIRTH:  1941/02/07  DATE OF ADMISSION:  03/06/2018  PRIMARY CARE PHYSICIAN: Maryland Pink, MD   REQUESTING/REFERRING PHYSICIAN: Cinda Quest, MD  CHIEF COMPLAINT:   Chief Complaint  Patient presents with  . Cardiac Arrest    HISTORY OF PRESENT ILLNESS:  Brianna Coleman  is a 77 y.o. female who presents with chief complaint as above.  Patient has been having short episodes of diaphoresis and epigastric discomfort for the past 3 days or so.  These have been short-lived and self resolving.  Today however, she had the same symptoms that lasted significantly longer and were not resolving on their own.  She was brought to the ED for evaluation and when she arrived here underwent cardiac arrest.  She was resuscitated with 1 round of epinephrine and CPR.  She was able to become alert again, did not require intubation, but was placed on BiPAP.  Hospitalist were called for admission  PAST MEDICAL HISTORY:   Past Medical History:  Diagnosis Date  . Anemia   . Aortic stenosis    mild AS by 03/20/13 echo [facility])  . Arthritis, degenerative   . Blood in stool   . Cancer ([place])    skin cancer  . 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 Chronic renal insufficiency    CKD stage IV, sees Dr Holley Raring in [place]  . Dyslipidemia   . Dysphagia   . Episode of syncope    Near-syncope  . Fall   . Gait disorder   . Gastroesophageal reflux disease   . Heart murmur   . History of blood transfusion   . Hypertension   . Memory disturbance   . Neurogenic bladder   . Orthostatic hypotension 06/17/2017  . Parkinson disease ([place])   . 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 RLS (restless legs syndrome)   . Shortness of breath    with exertion  . Stroke [facility])    no residual  . 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 CAROTID ENDARTERECTOMY    . CATARACT EXTRACTION, BILATERAL    . COLONOSCOPY    . ENDARTERECTOMY Right 09/19/2013   Procedure: ENDARTERECTOMY CAROTID WITH PATCH ANGIOPLASTY;  Surgeon: Elam Dutch, MD;  Location: [facility];  Service: Vascular;  Laterality: Right;  . EYE SURGERY Bilateral    cataract  . UPPER GI ENDOSCOPY    . vaginal cyst removal  2009     SOCIAL HISTORY:   Social History   Tobacco Use  . Smoking status: Never Smoker  . Smokeless tobacco: Never Used  Substance Use Topics  . Alcohol use: No     FAMILY HISTORY:   Family History  Problem Relation Age of Onset  . Hypertension Mother   . Heart disease Mother   . Hyperlipidemia Mother   . Breast cancer Mother   . Heart attack Father   . Hypertension Father   . Colon cancer Father   . Lung cancer Father   . Diabetes Brother   . Diabetes Brother   . Heart disease Brother   . Hyperlipidemia Brother   . Hypertension Brother   . Varicose Veins Brother   . Deep vein thrombosis Brother   . Hypertension Sister   . Stomach  cancer Maternal Grandmother      DRUG ALLERGIES:  No Known Allergies  MEDICATIONS AT HOME:   Prior to Admission medications   Medication Sig Start Date End Date Taking? Authorizing Provider  allopurinol (ZYLOPRIM) 100 MG tablet Take 100 mg by mouth daily. 02/17/18  Yes [provider]  aspirin EC 81 MG tablet Take 81 mg by mouth daily.   Yes [provider]  atorvastatin (LIPITOR) 80 MG tablet Take 80 mg by mouth every evening. 02/15/18  Yes [provider]  Carbidopa-Levodopa ER (SINEMET CR) 25-100 MG tablet controlled release TAKE 1 TABLET BY MOUTH FOUR TIMES A DAY 10/27/17  Yes Kathrynn Ducking, MD  cholecalciferol (VITAMIN D) 1000 UNITS tablet Take 1,000 Units by mouth daily.   Yes [provider]  clopidogrel (PLAVIX) 75 MG tablet Take 1 tablet (75 mg total) by mouth daily. 05/25/17   Yes Henreitta Leber, MD  conjugated estrogens (PREMARIN) vaginal cream Place 1 Applicatorful vaginally daily as needed.    Yes [provider]  docusate sodium (COLACE) 100 MG capsule TAKE 1 CAPSULE (100 MG TOTAL) BY MOUTH DAILY. DO NOT TAKE IF YOU HAVE LOOSE STOOLS 11/03/17  Yes Earlie Server, MD  donepezil (ARICEPT) 5 MG tablet Take 1 tablet (5 mg total) by mouth at bedtime. 10/25/17  Yes Kathrynn Ducking, MD  ferrous gluconate (FERGON) 324 MG tablet TAKE 1 TABLET (324 MG TOTAL) BY MOUTH 2 (TWO) TIMES DAILY WITH A MEAL. 11/03/17  Yes Earlie Server, MD  folic acid (FOLVITE) 947 MCG tablet TAKE 1 TABLET (400 MCG TOTAL) BY MOUTH DAILY. 11/03/17  Yes Earlie Server, MD  NIFEdipine (PROCARDIA-XL/ADALAT CC) 30 MG 24 hr tablet Take 30 mg by mouth daily. 09/07/17  Yes [provider]  ondansetron (ZOFRAN-ODT) 4 MG disintegrating tablet Take 4 mg by mouth every 8 (eight) hours as needed for nausea.  09/20/17  Yes [provider]  pantoprazole (PROTONIX) 40 MG tablet Take 40 mg by mouth daily. 09/07/17  Yes [provider]  pramipexole (MIRAPEX) 1 MG tablet Take 1 mg by mouth 3 (three) times daily.   Yes [provider]  QUEtiapine (SEROQUEL) 25 MG tablet Take 1 tablet (25 mg total) by mouth at bedtime. 01/11/18  Yes Kathrynn Ducking, MD  ranitidine (ZANTAC) 150 MG tablet Take 150 mg by mouth daily. 06/29/17  Yes [provider]    REVIEW OF SYSTEMS:  Review of Systems  Unable to perform ROS: Acuity of condition     VITAL SIGNS:   Vitals:   03/20/2018 2127 03/01/2018 2130 03/18/2018 2132 03/10/2018 2136  BP: 90/78 139/85  137/65  Pulse: (!) 107 74 (!) 122 (!) 129  Resp: (!) 27 (!) 26 (!) 25 (!) 26  Temp: (!) 96.5 F (35.8 C) (!) 96.7 F (35.9 C) (!) 96.8 F (36 C) (!) 97 F (36.1 C)  SpO2: 92% 92% 93% 93%   Wt Readings from Last 3 Encounters:  12/06/17 55.7 kg  11/30/17 56.2 kg  11/08/17 56.8 kg    PHYSICAL EXAMINATION:  Physical Exam  Vitals  reviewed. Constitutional: She appears well-developed and well-nourished. No distress.  HENT:  Head: Normocephalic and atraumatic.  Mouth/Throat: Oropharynx is clear and moist.  Eyes: Pupils are equal, round, and reactive to light. Conjunctivae and EOM are normal. No scleral icterus.  Neck: Normal range of motion. Neck supple. No JVD present. No thyromegaly present.  Cardiovascular: Regular rhythm and intact distal pulses. Exam reveals no gallop and no friction  rub.  No murmur heard. tachycardic  Respiratory: She is in respiratory distress. She has no wheezes. She has no rales.  GI: Soft. Bowel sounds are normal. She exhibits no distension. There is no tenderness.  Musculoskeletal: Normal range of motion. She exhibits no edema.  No arthritis, no gout  Lymphadenopathy:    She has no cervical adenopathy.  Neurological: She is alert. No cranial nerve deficit.  Unable to fully assess due to patient condition  Skin: Skin is warm and dry. No rash noted. No erythema.  Psychiatric:  Unable to fully assess due to patient condition    LABORATORY PANEL:   CBC Recent Labs  Lab 03/03/2018 1841  WBC 9.7  HGB 10.6*  HCT 33.3*  PLT 134*   ------------------------------------------------------------------------------------------------------------------  Chemistries  Recent Labs  Lab 03/13/2018 1841  NA 139  K 5.0  CL 108  CO2 14*  GLUCOSE 340*  BUN 45*  CREATININE 3.57*  CALCIUM 8.9  AST 31  ALT 5  ALKPHOS 81  BILITOT 0.7   ------------------------------------------------------------------------------------------------------------------  Cardiac Enzymes Recent Labs  Lab 02/21/2018 2043  TROPONINI 0.07*   ------------------------------------------------------------------------------------------------------------------  RADIOLOGY:  Ct Head Wo Contrast  Result Date: 02/26/2018 CLINICAL DATA:  Altered level of consciousness, unexplained. EXAM: CT HEAD WITHOUT CONTRAST TECHNIQUE:  Contiguous axial images were obtained from the base of the skull through the vertex without intravenous contrast. COMPARISON:  Head CT 11/30/2017 FINDINGS: Brain: No intracranial hemorrhage, mass effect, or midline shift. Unchanged atrophy and chronic small vessel ischemia. Unchanged remote lacunar infarcts in the right greater than left basal ganglia. Small remote left parietal infarct again seen. No cerebral edema. No hydrocephalus. The basilar cisterns are patent. No evidence of territorial infarct or acute ischemia. No extra-axial or intracranial fluid collection. Vascular: Atherosclerosis of skullbase vasculature without hyperdense vessel or abnormal calcification. Skull: No fracture or focal lesion. Sinuses/Orbits: Paranasal sinuses and mastoid air cells are clear. The visualized orbits are unremarkable. Other: None. IMPRESSION: 1.  No acute intracranial abnormality. 2. Unchanged atrophy, chronic small vessel ischemia, and remote lacunar infarcts in the basal ganglia. Electronically Signed   By: Keith Rake M.D.   On: 03/09/2018 21:08   Dg Chest Portable 1 View  Result Date: 03/01/2018 CLINICAL DATA:  Respiratory distress. EXAM: PORTABLE CHEST 1 VIEW COMPARISON:  12/18/2016. FINDINGS: Stable enlarged cardiac silhouette. Interval gaseous distention of the stomach, including a large hiatal hernia. Interval linear atelectasis at the right lung base. Clear left lung. Diffuse osteopenia. IMPRESSION: 1. Interval gaseous distention of the stomach, including a large hiatal hernia. 2. Interval linear atelectasis at the right lung base. Electronically Signed   By: Claudie Revering M.D.   On: 03/15/2018 19:18    EKG:   Orders placed or performed during the hospital encounter of 03/03/2018  . ED EKG  . ED EKG  . ED EKG  . ED EKG  . EKG 12-Lead  . EKG 12-Lead    IMPRESSION AND PLAN:  Principal Problem:   Cardiac arrest ([place]) -with ROSC after 1 round of epi and CPR.  Troponin barely elevated on first draw,  more elevated on second draw.  We will admit to ICU on BiPAP, trend cardiac enzymes, get echocardiogram and a cardiology consult Active Problems:   Acute renal failure superimposed on chronic kidney disease ([place]) -patient has been following with nephrology for her chronic kidney disease, she has slightly worsened function tonight, will get a nephrology consult   Essential (primary) hypertension -stable, continue home meds   Parkinson's  disease ([place]) -Home dose Sinemet   HLD (hyperlipidemia) -Home dose antilipid  Chart review performed and case discussed with ED provider. Labs, imaging and/or ECG reviewed by provider and discussed with patient/family. Management plans discussed with the patient and/or family.  DVT PROPHYLAXIS: SubQ heparin  GI PROPHYLAXIS:  PPI   ADMISSION STATUS: Inpatient  CODE STATUS: Full Code Status History    Date Active Date Inactive Code Status Order ID Comments User Context   05/24/2017 1730 05/25/2017 2110 Full Code [id number]  Dustin Flock, MD ED   09/19/2013 1721 09/20/2013 1700 Full Code [id number]  Gabriel Earing, PA-C Inpatient      TOTAL CRITICAL CARE TIME TAKING CARE OF THIS PATIENT: 50 minutes.   Kaytelyn Glore FIELDING 03/11/2018, 9:39 PM  [facility]  Office  [phone]  CC: Primary care physician; Maryland Pink, MD  Note:  This document was prepared using Dragon voice recognition software and may include unintentional dictation errors.

## 2018-02-27 NOTE — ED Notes (Signed)
VO Malinda d/c [place]

## 2018-02-27 NOTE — Progress Notes (Signed)
   03/11/2018 1840  Clinical Encounter Type  Visited With Patient and family together  Visit Type Initial;ED  Referral From Nurse  Consult/Referral To Chaplain  Spiritual Encounters  Spiritual Needs Prayer;Emotional  This author received call from operator of pastoral care needed in ED Family Room. Arrived at ED and talked with staff about call. Met with physician and nurse as they were headed into family room. Patient's son and daughter were present. Patient's pulse was lost briefly. Had suffered a seizure upon admission into ED. Son and daughter were tearful when physician updated them of mother's status. Chief Strategy Officer engaged family members when physician and nurse left. Entered patient's room to see her receiving care from several staff members. Stood by family. By this time, patient's sister, who is a Therapist, sports at hospital, joined [place]. Patient began to move extremities. Was able to respond to family members via squeezing hands. Began to move feet. Family shared with staff that patient does not like hospitals and gets very 'antsy'. Was also told that patient is religious and attends [employer] in [place]. Son and daughter are not religious.  Provided pastoral care by being a non-anxious presence. Was asked to pray for patient. Patient relaxed and fell asleep during prayer. Pastoral visit was appreciated. Follow up will be appreciated.

## 2018-02-28 ENCOUNTER — Inpatient Hospital Stay: Payer: Medicare Other

## 2018-02-28 ENCOUNTER — Inpatient Hospital Stay
Admit: 2018-02-28 | Discharge: 2018-02-28 | Disposition: A | Discharge status: Home / Self Care | Payer: Medicare Other | Attending: Internal Medicine | Admitting: Internal Medicine

## 2018-02-28 ENCOUNTER — Ambulatory Visit: Payer: Medicare Other | Admitting: Neurology

## 2018-02-28 DIAGNOSIS — I469 Cardiac arrest, cause unspecified: Secondary | ICD-10-CM

## 2018-02-28 LAB — PROCALCITONIN
PROCALCITONIN: 2.3 ng/mL
Procalcitonin: 0.31 ng/mL

## 2018-02-28 LAB — TROPONIN I
TROPONIN I: 3.57 ng/mL — AB (ref <0.03)
Troponin I: 4.43 ng/mL (ref <0.03)

## 2018-02-28 LAB — MRSA PCR SCREENING: MRSA BY PCR: NEGATIVE

## 2018-02-28 MED ORDER — ORAL CARE MOUTH RINSE
15.0000 mL | Freq: Two times a day (BID) | OROMUCOSAL | Status: DC
  Administered 2018-02-28 – 2018-03-03 (×6): 15 mL via OROMUCOSAL

## 2018-02-28 MED ORDER — SODIUM BICARBONATE 8.4 % IV SOLN
50.0000 meq | Freq: Once | INTRAVENOUS | Status: AC
  Administered 2018-02-28: 50 meq via INTRAVENOUS
  Filled 2018-02-28: qty 50

## 2018-02-28 MED ORDER — CHLORHEXIDINE GLUCONATE 0.12 % MT SOLN
15.0000 mL | Freq: Two times a day (BID) | OROMUCOSAL | Status: DC
  Administered 2018-02-28 – 2018-03-04 (×9): 15 mL via OROMUCOSAL
  Filled 2018-02-28 (×6): qty 15

## 2018-02-28 MED ORDER — NEPRO/CARBSTEADY PO LIQD
237.0000 mL | Freq: Two times a day (BID) | ORAL | Status: DC
  Administered 2018-02-28 – 2018-03-03 (×4): 237 mL via ORAL

## 2018-02-28 MED ORDER — SODIUM CHLORIDE 0.9 % IV BOLUS
1000.0000 mL | Freq: Once | INTRAVENOUS | Status: AC
  Administered 2018-02-28: 1000 mL via INTRAVENOUS

## 2018-02-28 MED ORDER — SODIUM CHLORIDE 0.9 % IV SOLN
3.0000 g | INTRAVENOUS | Status: AC
  Administered 2018-02-28 – 2018-03-06 (×7): 3 g via INTRAVENOUS
  Filled 2018-02-28 (×8): qty 3

## 2018-02-28 MED ORDER — FUROSEMIDE 10 MG/ML IJ SOLN
40.0000 mg | Freq: Once | INTRAMUSCULAR | Status: AC
  Administered 2018-02-28: 40 mg via INTRAVENOUS
  Filled 2018-02-28: qty 4

## 2018-02-28 MED ORDER — KETOROLAC TROMETHAMINE 30 MG/ML IJ SOLN
15.0000 mg | Freq: Two times a day (BID) | INTRAMUSCULAR | Status: DC | PRN
  Administered 2018-02-28: 15 mg via INTRAVENOUS
  Filled 2018-02-28: qty 1

## 2018-02-28 NOTE — Progress Notes (Signed)
Initial Nutrition Assessment  DOCUMENTATION CODES:   Severe malnutrition in context of chronic illness  INTERVENTION:  Provide Nepro Shake po BID, each supplement provides 425 kcal and 19 grams protein. Family reports patient will likely prefer mixed berry flavor.  Provided handout from [employer] on dysphagia 1 (pureed) diet per family request.  NUTRITION DIAGNOSIS:   Severe Malnutrition related to chronic illness(CKD stage IV, large hiatal hernia) as evidenced by moderate fat depletion, severe fat depletion, moderate muscle depletion, severe muscle depletion.  GOAL:   Patient will meet greater than or equal to 90% of their needs  MONITOR:   PO intake, Supplement acceptance, Labs, Weight trends, Skin, I & O's  REASON FOR ASSESSMENT:   Malnutrition Screening Tool    ASSESSMENT:   77 year old female with PMHx of Parkinson's disease, dyslipidemia, HTN, arthritis, GERD, RLS, neurogenic bladder, dysphagia, memory disturbance, gait disorder, hx CVA, aortic stenosis, CKD stage IV, hiatal hernia who is admitted after cardiac arrest, acute pulmonary edema, N/V, possible aspiration, abdominal pain, acute on chronic renal failure, also with large hiatal hernia.   -Patient had NGT to LIS but it was removed this AM. -Diet advanced to dysphagia 1 (pureed) with thin liquids following SLP evaluation this AM.  Met with patient, her son, and daughter-in-law at bedside. Patient sleeping so history provided by family members. They report she has had a poor appetite for a while now. They now realize it was probably from the large hiatal hernia as she has had abdominal pain and N/V. She used to eat 3 meals per day plus snacks between meals. Lately they have been struggling to get her to eat 2 meals per day. They are typically small meals such as a bowl of soup made by daughter-in-law. They have been carefully following meal plans from [employer] website for CKD stage IV. They have  been limiting protein, sodium, and phosphorus. RD concerned that with poor appetite patient is now not taking in enough calories and protein. She has significant wasting of muscle and fat. Family is amenable to trying an oral nutrition supplement to help meet calorie/protein needs. Family requested a handout on pureed diet.  UBW was 125-132 lbs (56.8-60 kg) and they have noticed weight loss. Per chart patient was 59.2 kg on 08/18/2017 and is currently 55.3 kg. She has lost 3.9 kg (6.6% body weight) over the past 5 months, which is not significant for time frame.  Medications reviewed and include: pantoprazole, Unasyn.  Labs reviewed: CO2 21, BUN 52, Creatinine 3.47.  Discussed with RN and on rounds.  NUTRITION - FOCUSED PHYSICAL EXAM:    Most Recent Value  Orbital Region  Severe depletion  Upper Arm Region  Severe depletion  Thoracic and Lumbar Region  Moderate depletion  Buccal Region  Severe depletion  Temple Region  Severe depletion  Clavicle Bone Region  Severe depletion  Clavicle and Acromion Bone Region  Moderate depletion  Scapular Bone Region  Unable to assess  Dorsal Hand  Severe depletion  Patellar Region  Moderate depletion  Anterior Thigh Region  Moderate depletion  Posterior Calf Region  Severe depletion  Edema (RD Assessment)  Mild  Hair  Reviewed  Eyes  Unable to assess  Mouth  Unable to assess  Skin  Reviewed  Nails  Reviewed     Diet Order:   Diet Order            DIET - DYS 1 Room service appropriate? Yes with Assist; Fluid consistency:  Thin  Diet effective now              EDUCATION NEEDS:   Education needs have been addressed  Skin:  Skin Assessment: Reviewed RN Assessment  Last BM:  02/28/2018  Height:   Ht Readings from Last 1 Encounters:  03/05/2018 5' 3"  (1.6 m)    Weight:   Wt Readings from Last 1 Encounters:  03/01/2018 55.3 kg    Ideal Body Weight:  52.3 kg  BMI:  Body mass index is 21.6 kg/m.  Estimated Nutritional Needs:    Kcal:  2979-8921 (25-30 kcal/kg)  Protein:  55 grams (1 gram/kg)  Fluid:  1.4-1.6 L/day (1 mL/kcal)  Willey Blade, MS, RD, LDN Office: [phone] Pager: [phone] After Hours/Weekend Pager: [phone]

## 2018-02-28 NOTE — Progress Notes (Signed)
Pharmacy Antibiotic Note  Brianna Coleman is a 77 y.o. female admitted on 02/21/2018 s/p cardiac arrest. Pharmacy has been consulted for Unasyn dosing for possible aspiration pneumonia.   Plan: Will transition patient to Unasyn 3g IV Q24hr with first dose scheduled for 1800 on 9/9. Patient received am dose of Zosyn on 9/9. Per ICU rounds, will treat for total of 7 days of therapy.   Height: 5\' 3"  (160 cm) Weight: 121 lb 14.6 oz (55.3 kg) IBW/kg (Calculated) : 52.4  Temp (24hrs), Avg:96.5 F (35.8 C), Min:92.7 F (33.7 C), Max:101.1 F (38.4 C)  Recent Labs  Lab 03/19/2018 1841 03/01/2018 2314  WBC 9.7  --   CREATININE 3.57* 3.47*    Estimated Creatinine Clearance: 11.1 mL/min (A) (by C-G formula based on SCr of 3.47 mg/dL (H)).    No Known Allergies  Antimicrobials this admission: Cefepime 9/8 x 1  Zosyn 9/9 X 1 Unasyn 9/9 >> 9/14  Dose adjustments this admission: N/A  Microbiology results: 9/8 BCx: no growth x 12 hours  9/8 MRSA PCR: negative   Thank you for allowing pharmacy to be a part of this patient's care.  Simpson,Michael L 02/28/2018 11:41 AM

## 2018-02-28 NOTE — Progress Notes (Signed)
*  PRELIMINARY RESULTS* Echocardiogram 2D Echocardiogram has been performed.  [name] 02/28/2018, 2:13 PM

## 2018-02-28 NOTE — Evaluation (Addendum)
Clinical/Bedside Swallow Evaluation Patient Details  Name: Brianna Coleman MRN: [id number] Date of Birth: 1940-08-07  Today's Date: 02/28/2018 Time: SLP Start Time (ACUTE ONLY): 4 SLP Stop Time (ACUTE ONLY): 1015 SLP Time Calculation (min) (ACUTE ONLY): 60 min  Past Medical History:  Past Medical History:  Diagnosis Date  . Anemia   . Aortic stenosis    mild AS by 03/20/13 echo [facility])  . Arthritis, degenerative   . Blood in stool   . Cancer ([place])    skin cancer  . 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 Chronic renal insufficiency    CKD stage IV, sees Dr Holley Raring in [place]  . Dyslipidemia   . Dysphagia   . Episode of syncope    Near-syncope  . Fall   . Gait disorder   . Gastroesophageal reflux disease   . Heart murmur   . Hiatal hernia with GERD   . History of blood transfusion   . Hypertension   . Memory disturbance   . Neurogenic bladder   . Orthostatic hypotension 06/17/2017  . Parkinson disease ([place])   . 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 RLS (restless legs syndrome)   . Shortness of breath    with exertion  . Stroke [facility])    no residual  . 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 CAROTID ENDARTERECTOMY    . CATARACT EXTRACTION, BILATERAL    . COLONOSCOPY    . ENDARTERECTOMY Right 09/19/2013   Procedure: ENDARTERECTOMY CAROTID WITH PATCH ANGIOPLASTY;  Surgeon: Elam Dutch, MD;  Location: [facility];  Service: Vascular;  Laterality: Right;  . EYE SURGERY Bilateral    cataract  . UPPER GI ENDOSCOPY    . vaginal cyst removal  2009   HPI:  Pt is a 77 y.o. female who presents with admission post cardiac arrest. Pt has a PMH of Multiple medical issues including Parkinson's Dis., GERD, stroke, and dysphagia per Dtr.  Patient has been having short episodes of diaphoresis and epigastric discomfort for the past 3 days or so.   These have been short-lived and self resolving.  Today however, she had the same symptoms that lasted significantly longer and were not resolving on their own.  She was brought to the ED for evaluation and when she arrived here underwent cardiac arrest.  She was resuscitated with 1 round of epinephrine and CPR.  She was able to become alert again, did not require intubation, but was placed on BiPAP. Daughter endorsed Cognitive decline baseline at home - noted H&P listed "memory disturbance". Pt also has a large Hiatal Hernia which can impact Esophageal phase motility and cause Dysmotility.   Assessment / Plan / Recommendation Clinical Impression  Pt appears to present w/ adequate oropharyngeal phase swallowing function but is at Mild risk for dysphagia, aspiration d/t BASELINE Neurological deficits (Parkinson's Dis.) in light of Acute illness and hospitalization. When following general aspiration precautions as she has been doing at home per Dtr, pt appears at reduced risk for aspiration. Daughter stated they follow aspiration precautions post previous dysphagia tx in past secondary to pt's dx of Parkinson's Dis(no straws, no mixed consistencies). OM exam was wfl w/ no unilateral weakness noted in lingual/labial movements during bolus control and management. Pt consumed trials of thin liquids Via Cup and purees w/ no immediate, overt s/s of aspiration noted; no decline in respiratory status(O2 sats  remained upper 96% or above w/ no increase/decrease in RR) and no change in vocal quality appreciated during/post trials. Oral phase c/b fairly adequate oral control, timely A-P transfer, and full oral clearing b/t trials. Dtr stated pt has "pocketed" the foods at home recently. pt was min impulsive when drinking liquids requiring monitoring. Pt helped to Hold Cup when drinking but needed full support overall; pt does have some Cognitive decline at baseline as endorsed by Daughter, and chart review. Discussed general  aspiration precautions and swallowing support strategies as used during this evaluation; Dtr practiced helping pt drink sips of liquid via Cup and endorsed she felt "comfortable" helping pt. Precautions posted in room. Discussed pt's baseline comorbidities which can increase risk for dysphagia during this time of Acute illness and discussed need for modified food consistency. Daughter agreed. Pt appears declined from her baseline when seen in Dec. 2018. Recommend a dysphagia level 1 diet w/ Thin liquids; strict aspiration precautions; Pills in puree Crushed as able. Feeding support at all meals.   Of note, ANY Esophageal phase dysmotility (from Hiatal Hernia) can increased risk for Regurgitation which can then increase risk for aspiration of Reflux material thus impacting the Pulmonary status. Dtr is aware if need for Reflux precautions.  SLP Visit Diagnosis: Dysphagia, oropharyngeal phase (R13.12);Dysphagia, pharyngoesophageal phase (R13.14)    Aspiration Risk  Mild+ aspiration risk(d/t baseline of Neurological deficits )    Diet Recommendation  Dysphagia level 1 (puree) w/ Thin liquids via Cup(have pt help to hold); aspiration precautions  Medication Administration: Crushed with puree(as able or in liquid form for safer swallowing)    Other  Recommendations Recommended Consults: (dietician f/u for support) Oral Care Recommendations: Oral care BID;Staff/trained caregiver to provide oral care Other Recommendations: (n/a)   Follow up Recommendations (TBD)      Frequency and Duration min 3x week  2 weeks       Prognosis Prognosis for Safe Diet Advancement: Fair Barriers to Reach Goals: Cognitive deficits;Severity of deficits(baseline deficits)      Swallow Study   General Date of Onset: 02/22/2018 HPI: Pt is a 77 y.o. female who presents with admission post cardiac arrest. Pt has a PMH of Multiple medical issues including Parkinson's Dis., GERD, stroke, and dysphagia per Dtr.  Patient has  been having short episodes of diaphoresis and epigastric discomfort for the past 3 days or so.  These have been short-lived and self resolving.  Today however, she had the same symptoms that lasted significantly longer and were not resolving on their own.  She was brought to the ED for evaluation and when she arrived here underwent cardiac arrest.  She was resuscitated with 1 round of epinephrine and CPR.  She was able to become alert again, did not require intubation, but was placed on BiPAP. Daughter endorsed Cognitive decline baseline at home - noted H&P listed "memory disturbance".  Type of Study: Bedside Swallow Evaluation Previous Swallow Assessment: 05/2017 Diet Prior to this Study: Dysphagia 3 (soft);Thin liquids Temperature Spikes Noted: (wbc 9.7; temp 101.1) Respiratory Status: Nasal cannula(5 liters) History of Recent Intubation: No(Bipap) Behavior/Cognition: Cooperative;Pleasant mood;Confused;Impulsive;Distractible;Requires cueing(Awake) Oral Cavity Assessment: Dry Oral Care Completed by SLP: Recent completion by staff Oral Cavity - Dentition: Adequate natural dentition Vision: Functional for self-feeding Self-Feeding Abilities: Able to feed self;Needs assist;Needs set up;Total assist(can help hold cup) Patient Positioning: Upright in bed(needed support w/ many pillows d/t posture) Baseline Vocal Quality: Low vocal intensity Volitional Cough: Cognitively unable to elicit Volitional Swallow: Unable to elicit  Oral/Motor/Sensory Function Overall Oral Motor/Sensory Function: Within functional limits   Ice Chips Ice chips: Within functional limits Presentation: Spoon(fed; 3 trials)   Thin Liquid Thin Liquid: Within functional limits(helped to hold the cup) Presentation: Cup;Self Fed(assisted; 10 trials total) Other Comments: min impulsive and needed cues    Nectar Thick Nectar Thick Liquid: Not tested   Honey Thick Honey Thick Liquid: Not tested   Puree Puree: Within functional  limits Presentation: Spoon(fed; 1 trial) Other Comments: pt could not accept more d/t recent feelings of N/V   Solid     Solid: Not tested       Orinda Kenner, MS, CCC-SLP Kassity Woodson 02/28/2018,2:59 PM

## 2018-02-28 NOTE — Progress Notes (Signed)
Abd xray results discussed w/ Ms Evern Core.  Orders received to leave NTG in current location and keep to LIS. Make pt strict NPO and order Speech/Swallow eval for am.  Orders executed per request.

## 2018-02-28 NOTE — Progress Notes (Signed)
Pt grimacing, order obtained for 15mg  IV Toradol, orders carried out. Will continue to monitor.

## 2018-02-28 NOTE — ED Notes (Signed)
Pt regained heart beat but continued to have respiratory effort aided by ambu bag

## 2018-02-28 NOTE — Progress Notes (Signed)
After pt was pulled up in the bed pt had seizure like activity that lasted for 5 seconds. Daughter states that pt has these "episodes" at home frequently and she calls them orthostatic hypotension episodes. Pts BP was 78/33, recycled and read 96/66. Pt now resting comfortably. MD notified of event, will continue to monitor.

## 2018-02-28 NOTE — Progress Notes (Signed)
Pt taken off bipap and switched to 5 liters [place] in order to remove nonfunctioning NG tube. Pt tolerated well. NG tube removed and now sustaining 99% on nasal cannula. Will continue to monitor.

## 2018-02-28 NOTE — ED Notes (Signed)
Pt arrives via [facility] from home for weakness, cold diaphoretic and clammy. Pt has a known hx of Orthostatics so family told EMS they thought it could possibly be that. Upon moving pt from stretcher to bed pt had a seizure that was full body affixation and stiff extremities, pt eyes unresponsive. Pulse was check bc pt was noted to have very shallow breathing. Pt had no pulse verified by EMS and RN. CPR was started, Code began. Pt had 1 episode of vomiting on initial CPR. Pt after 2 rounds of CPR and 1 dose of Epi pt had cardiac activity of ST.

## 2018-02-28 NOTE — Plan of Care (Signed)
Pt weaned off of levophed, maintaining adequate BP. SaO2 improving with Bipap, but pt unable to wean off at this time. Lung sounds cont to be very coarse.  NPO w/NGT in place.  Currently no N/V.  Minimal dark red,brown output in NGT to LIS.

## 2018-02-28 NOTE — Progress Notes (Signed)
   02/28/18 1155  Clinical Encounter Type  Visited With Patient and family together  Visit Type Follow-up  Referral From Chaplain  Consult/Referral To Chaplain   Based on referral from [place], unit chaplain followed up to introduce self to family.  Daughter Otila Kluver present at bedside, declined visit at present, but remains open to ongoing follow up.

## 2018-02-28 NOTE — Telephone Encounter (Signed)
   Ref Range & Units 15mo ago  Folate >5.9 ng/mL 12.1

## 2018-02-28 NOTE — Progress Notes (Signed)
[facility] [place] NAME: Oneida Mckamey    MR#:  [id number]  DATE OF BIRTH:  March 16, 1941  SUBJECTIVE:  CHIEF COMPLAINT:   Chief Complaint  Patient presents with  . Cardiac Arrest  feels some better, hypotensive REVIEW OF SYSTEMS:  Review of Systems  Constitutional: Negative for chills, fever and weight loss.  HENT: Negative for nosebleeds and sore throat.   Eyes: Negative for blurred vision.  Respiratory: Negative for cough, shortness of breath and wheezing.   Cardiovascular: Negative for chest pain, orthopnea, leg swelling and PND.  Gastrointestinal: Negative for abdominal pain, constipation, diarrhea, heartburn, nausea and vomiting.  Genitourinary: Negative for dysuria and urgency.  Musculoskeletal: Negative for back pain.  Skin: Negative for rash.  Neurological: Negative for dizziness, speech change, focal weakness and headaches.  Endo/Heme/Allergies: Does not bruise/bleed easily.  Psychiatric/Behavioral: Negative for depression.    DRUG ALLERGIES:  No Known Allergies VITALS:  Blood pressure (!) 111/56, pulse (!) 106, temperature 100.2 F (37.9 C), resp. rate (!) 22, height 5\' 3"  (1.6 m), weight 55.3 kg, SpO2 99 %. PHYSICAL EXAMINATION:  Physical Exam  Constitutional: She is oriented to person, place, and time.  HENT:  Head: Normocephalic and atraumatic.  Eyes: Pupils are equal, round, and reactive to light. Conjunctivae and EOM are normal.  Neck: Normal range of motion. Neck supple. No tracheal deviation present. No thyromegaly present.  Cardiovascular: Normal rate, regular rhythm and normal heart sounds.  Pulmonary/Chest: Effort normal and breath sounds normal. No respiratory distress. She has no wheezes. She exhibits no tenderness.  Abdominal: Soft. Bowel sounds are normal. She exhibits no distension. There is no tenderness.  Musculoskeletal: Normal range of motion.  Neurological: She is alert and oriented to person,  place, and time. No cranial nerve deficit.  Skin: Skin is warm and dry. No rash noted.   LABORATORY PANEL:  Female CBC Recent Labs  Lab 03/17/2018 1841  WBC 9.7  HGB 10.6*  HCT 33.3*  PLT 134*   ------------------------------------------------------------------------------------------------------------------ Chemistries  Recent Labs  Lab 02/20/2018 2314  NA 142  K 3.6  CL 110  CO2 21*  GLUCOSE 202*  BUN 52*  CREATININE 3.47*  CALCIUM 8.2*  MG 2.0  AST 82*  ALT 10  ALKPHOS 76  BILITOT 0.6   RADIOLOGY:  Dg Abd 1 View  Result Date: 02/28/2018 CLINICAL DATA:  Abdominal pain EXAM: ABDOMEN - 1 VIEW COMPARISON:  None. FINDINGS: Gaseous distention of the stomach and small bowel. No dilated small bowel is visualized. Incompletely visualized cardiomegaly. IMPRESSION: Gas distended stomach. No radiographic evidence of small-bowel obstruction. Electronically Signed   By: Ulyses Jarred M.D.   On: 02/28/2018 00:11   Ct Head Wo Contrast  Result Date: 02/26/2018 CLINICAL DATA:  Altered level of consciousness, unexplained. EXAM: CT HEAD WITHOUT CONTRAST TECHNIQUE: Contiguous axial images were obtained from the base of the skull through the vertex without intravenous contrast. COMPARISON:  Head CT 11/30/2017 FINDINGS: Brain: No intracranial hemorrhage, mass effect, or midline shift. Unchanged atrophy and chronic small vessel ischemia. Unchanged remote lacunar infarcts in the right greater than left basal ganglia. Small remote left parietal infarct again seen. No cerebral edema. No hydrocephalus. The basilar cisterns are patent. No evidence of territorial infarct or acute ischemia. No extra-axial or intracranial fluid collection. Vascular: Atherosclerosis of skullbase vasculature without hyperdense vessel or abnormal calcification. Skull: No fracture or focal lesion. Sinuses/Orbits: Paranasal sinuses and mastoid air cells are clear. The visualized orbits are unremarkable.  Other: None. IMPRESSION: 1.  No  acute intracranial abnormality. 2. Unchanged atrophy, chronic small vessel ischemia, and remote lacunar infarcts in the basal ganglia. Electronically Signed   By: Keith Rake M.D.   On: 02/20/2018 21:08   Dg Chest Port 1 View  Result Date: 03/15/2018 CLINICAL DATA:  Cardiac arrest. EXAM: PORTABLE CHEST 1 VIEW COMPARISON:  Radiograph earlier this day. FINDINGS: Cardiac enlargement partially obscured by large retrocardiac hiatal hernia. Slight decreased gaseous gastric distension from prior. Aortic atherosclerosis. Mild vascular congestion without overt pulmonary edema. Probable bibasilar atelectasis with possible small pleural effusions. Improved linear right lung base atelectasis from prior. No pneumothorax. IMPRESSION: 1. Probable bibasilar atelectasis and possible pleural effusions. Vascular congestion. 2. Cardiac enlargement, partially obscured by large hiatal hernia. Slight decreased gaseous gastric distension from prior. Electronically Signed   By: Keith Rake M.D.   On: 03/08/2018 23:22   Dg Chest Portable 1 View  Result Date: 03/21/2018 CLINICAL DATA:  Respiratory distress. EXAM: PORTABLE CHEST 1 VIEW COMPARISON:  12/18/2016. FINDINGS: Stable enlarged cardiac silhouette. Interval gaseous distention of the stomach, including a large hiatal hernia. Interval linear atelectasis at the right lung base. Clear left lung. Diffuse osteopenia. IMPRESSION: 1. Interval gaseous distention of the stomach, including a large hiatal hernia. 2. Interval linear atelectasis at the right lung base. Electronically Signed   By: Claudie Revering M.D.   On: 03/17/2018 19:18   Dg Abd Portable 1v  Result Date: 02/28/2018 CLINICAL DATA:  Nasogastric tube placement EXAM: PORTABLE ABDOMEN - 1 VIEW COMPARISON:  Portable exam 0138 hours compared to 03/11/2018 and correlated with prior CT abdomen and pelvis exam of 12/18/2016 FINDINGS: Nasogastric tube coiled in a large hiatal hernia at the lower LEFT hemithorax. Gaseous  distention of the distal stomach below the diaphragm. Air-filled loops of nondistended bowel throughout abdomen and pelvis. Bones demineralized. External pacing leads noted. IMPRESSION: Tip of nasogastric tube is coiled within a large hiatal hernia at the inferior LEFT chest. Electronically Signed   By: Lavonia Dana M.D.   On: 02/28/2018 02:00   ASSESSMENT AND PLAN:   * Cardiac arrest ([place])  Echo per cardio - trend troponins peak at 4.4  * Acute renal failure superimposed on chronic kidney disease ([place]) -patient has been following with nephrology for her chronic kidney disease, she has slightly worsened function tonight, will get a nephrology consult  * Hypotension with h/o Essential (primary) hypertension - monitor in ICU   Parkinson's disease ([place]) -Home dose Sinemet   HLD (hyperlipidemia) -Home dose antilipid  D/w family who was at bedside   All the records are reviewed and case discussed with Care Management/Social Worker. Management plans discussed with the patient, family and they are in agreement.  CODE STATUS: Full Code  TOTAL TIME TAKING CARE OF THIS PATIENT: 35 minutes.   More than 50% of the time was spent in counseling/coordination of care: YES  POSSIBLE D/C IN 3-4 DAYS, DEPENDING ON CLINICAL CONDITION.   Max Sane M.D on 02/28/2018 at 6:01 PM  Between 7am to 6pm - Pager - [phone]  After 6pm go to [email or web address] - Proofreader  [facility]  Office  [phone]  CC: Primary care physician; [place] Pink, MD  Note: This dictation was prepared with Dragon dictation along with smaller phrase technology. Any transcriptional errors that result from this process are unintentional.

## 2018-02-28 NOTE — Consult Note (Signed)
[facility] Consultation Note  Patient ID: Brianna Coleman, MRN: [id number], DOB/AGE: [birth year] 78 y.o. Admit date: 03/01/2018   Date of Consult: 02/28/2018 Primary Physician: Maryland Pink, MD Primary Cardiologist: Raynelle Chary  Chief Complaint:  Chief Complaint  Patient presents with  . Cardiac Arrest   Reason for Consult: Apparent cardiac arrest  HPI: 77 y.o. female with known peripheral vascular disease with carotid endarterectomy in the past severe chronic kidney disease stage IV with exacerbation mild aortic valve stenosis Parkinson's and multiple previous falls with subarachnoid hemorrhage and previously abnormal EKG.  The patient had been doing relatively well with her current appropriate medication management for above problems when she was seen in the emergency room.  At that time the patient had an apparent cardiac arrest that was witnessed.  There were no evidence of recorded EKG changes other than sinus tachycardia but apparently there was some sinus arrest.  This may due to pulseless electrical activity or respiratory or unknown.  At that time the patient received epinephrine and oxygenation and was recovered fairly quickly.  Under those conditions the patient did have an elevated troponin of 3.5 consistent with non-ST elevation myocardial infarction and received BiPAP and further oxygenation and has hemodynamically recovered with no evidence of recurrence.  The patient's telemetry has shown sinus tachycardia and EKG remains with normal sinus rhythm left axis deviation is right bundle branch block with preventricular contractions.  The patient is not communicating really well at this time for unknown reasons it appears to have been placed on antibiotics for concerns of infection.  Currently she is hemodynamically stable not complaining of any cardiovascular symptoms and no current evidence of congestive heart failure. Past Medical History:  Diagnosis Date  . Anemia   .  Aortic stenosis    mild AS by 03/20/13 echo [facility])  . Arthritis, degenerative   . Blood in stool   . Cancer ([place])    skin cancer  . 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 Chronic renal insufficiency    CKD stage IV, sees Dr Holley Raring in [place]  . Dyslipidemia   . Dysphagia   . Episode of syncope    Near-syncope  . Fall   . Gait disorder   . Gastroesophageal reflux disease   . Heart murmur   . Hiatal hernia with GERD   . History of blood transfusion   . Hypertension   . Memory disturbance   . Neurogenic bladder   . Orthostatic hypotension 06/17/2017  . Parkinson disease ([place])   . 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 RLS (restless legs syndrome)   . Shortness of breath    with exertion  . Stroke [facility])    no residual  . 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 CAROTID ENDARTERECTOMY    . CATARACT EXTRACTION, BILATERAL    . COLONOSCOPY    . ENDARTERECTOMY Right 09/19/2013   Procedure: ENDARTERECTOMY CAROTID WITH PATCH ANGIOPLASTY;  Surgeon: Elam Dutch, MD;  Location: [place];  Service: Vascular;  Laterality: Right;  . EYE SURGERY Bilateral    cataract  . UPPER GI ENDOSCOPY    . vaginal cyst removal  2009     Home Meds: Prior to Admission medications   Medication Sig Start Date End Date Taking? Authorizing Provider  allopurinol (ZYLOPRIM) 100 MG tablet Take 100 mg by mouth daily. 02/17/18  Yes [provider]  aspirin EC 81 MG tablet Take 81 mg by mouth daily.   Yes [provider]  atorvastatin (LIPITOR) 80 MG tablet Take 80 mg by mouth every evening. 02/15/18  Yes [provider]  Carbidopa-Levodopa ER (SINEMET CR) 25-100 MG tablet controlled release TAKE 1 TABLET BY MOUTH FOUR TIMES A DAY 10/27/17  Yes Kathrynn Ducking, MD  cholecalciferol (VITAMIN D) 1000 UNITS tablet Take 1,000 Units by mouth daily.   Yes  [provider]  clopidogrel (PLAVIX) 75 MG tablet Take 1 tablet (75 mg total) by mouth daily. 05/25/17  Yes Henreitta Leber, MD  conjugated estrogens (PREMARIN) vaginal cream Place 1 Applicatorful vaginally daily as needed.    Yes [provider]  docusate sodium (COLACE) 100 MG capsule TAKE 1 CAPSULE (100 MG TOTAL) BY MOUTH DAILY. DO NOT TAKE IF YOU HAVE LOOSE STOOLS 11/03/17  Yes Earlie Server, MD  donepezil (ARICEPT) 5 MG tablet Take 1 tablet (5 mg total) by mouth at bedtime. 10/25/17  Yes Kathrynn Ducking, MD  ferrous gluconate (FERGON) 324 MG tablet TAKE 1 TABLET (324 MG TOTAL) BY MOUTH 2 (TWO) TIMES DAILY WITH A MEAL. 11/03/17  Yes Earlie Server, MD  folic acid (FOLVITE) 811 MCG tablet TAKE 1 TABLET (400 MCG TOTAL) BY MOUTH DAILY. 11/03/17  Yes Earlie Server, MD  NIFEdipine (PROCARDIA-XL/ADALAT CC) 30 MG 24 hr tablet Take 30 mg by mouth daily. 09/07/17  Yes [provider]  ondansetron (ZOFRAN-ODT) 4 MG disintegrating tablet Take 4 mg by mouth every 8 (eight) hours as needed for nausea.  09/20/17  Yes [provider]  pantoprazole (PROTONIX) 40 MG tablet Take 40 mg by mouth daily. 09/07/17  Yes [provider]  pramipexole (MIRAPEX) 1 MG tablet Take 1 mg by mouth 3 (three) times daily.   Yes [provider]  QUEtiapine (SEROQUEL) 25 MG tablet Take 1 tablet (25 mg total) by mouth at bedtime. 01/11/18  Yes Kathrynn Ducking, MD  ranitidine (ZANTAC) 150 MG tablet Take 150 mg by mouth daily. 06/29/17  Yes [provider]    Inpatient Medications:  . aspirin EC  81 mg Oral Daily  . atorvastatin  80 mg Oral QPM  . Carbidopa-Levodopa ER  1 tablet Oral QID  . chlorhexidine  15 mL Mouth Rinse BID  . clopidogrel  75 mg Oral Daily  . heparin injection (subcutaneous)  5,000 Units Subcutaneous Q8H  . ipratropium-albuterol  3 mL Nebulization Q4H  . mouth rinse  15 mL Mouth Rinse q12n4p  . pantoprazole  40 mg Oral Daily  . pramipexole  1 mg Oral TID   . sodium  chloride 20 mL/hr at 02/28/18 0600  . norepinephrine (LEVOPHED) Adult infusion Stopped (03/05/2018 2320)  . piperacillin-tazobactam (ZOSYN)  IV Stopped (02/28/18 0450)    Allergies: No Known Allergies  Social History   Socioeconomic History  . Marital status: Widowed    Spouse name: Not on file  . Number of children: 2  . Years of education: hs  . Highest education level: Not on file  Occupational History  . Occupation: Retired    Fish farm manager: RETIRED  Social Needs  . Financial resource strain: Not hard at all  . Food insecurity:    Worry: Patient refused    Inability: Patient refused  . Transportation needs:    Medical: Patient refused    Non-medical: Patient refused  Tobacco Use  . Smoking status: Never Smoker  . Smokeless tobacco: Never Used  Substance and Sexual Activity  . Alcohol use: No  . Drug use: No  . Sexual activity: Not Currently  Lifestyle  . Physical activity:    Days per week: Patient refused    Minutes per session: Patient refused  . Stress: Patient refused  Relationships  . Social connections:    Talks on phone: Patient refused    Gets together: Patient refused    Attends religious service: Patient refused    Active member of club or organization: Patient refused    Attends meetings of clubs or organizations: Patient refused    Relationship status: Patient refused  . Intimate partner violence:    Fear of current or ex partner: Patient refused    Emotionally abused: Patient refused    Physically abused: Patient refused    Forced sexual activity: Patient refused  Other Topics Concern  . Not on file  Social History Narrative   Patient drinks caffeine occasionally.   Patient is right handed.     Family History  Problem Relation Age of Onset  . Hypertension Mother   . Heart disease Mother   . Hyperlipidemia Mother   . Breast cancer Mother   . Heart attack Father   . Hypertension Father   . Colon cancer Father   . Lung cancer Father   .  Diabetes Brother   . Diabetes Brother   . Heart disease Brother   . Hyperlipidemia Brother   . Hypertension Brother   . Varicose Veins Brother   . Deep vein thrombosis Brother   . Hypertension Sister   . Stomach cancer Maternal Grandmother      Review of Systems Not able to converse at this time Labs: Recent Labs    03/09/2018 1841 03/02/2018 2043 03/06/2018 2314 02/28/18 0446  TROPONINI 0.03* 0.07* 0.76* 3.57*   Lab Results  Component Value Date   WBC 9.7 03/03/2018   HGB 10.6 (L) 03/02/2018   HCT 33.3 (L) 02/26/2018   MCV 100.5 (H) 03/21/2018   PLT 134 (L) 02/23/2018    Recent Labs  Lab 03/08/2018 2314  NA 142  K 3.6  CL 110  CO2 21*  BUN 52*  CREATININE 3.47*  CALCIUM 8.2*  PROT 6.2*  BILITOT 0.6  ALKPHOS 76  ALT 10  AST 82*  GLUCOSE 202*   Lab Results  Component Value Date   CHOL 213 (H) 05/25/2017   HDL 49 05/25/2017   LDLCALC 147 (H) 05/25/2017   TRIG 86 05/25/2017   No results found for: DDIMER  Radiology/Studies:  Dg Abd 1 View  Result Date: 02/28/2018 CLINICAL DATA:  Abdominal pain EXAM: ABDOMEN - 1 VIEW COMPARISON:  None. FINDINGS: Gaseous distention of the stomach and small bowel. No dilated small bowel is visualized. Incompletely visualized cardiomegaly. IMPRESSION: Gas distended stomach. No radiographic evidence of small-bowel obstruction. Electronically Signed   By: Ulyses Jarred M.D.   On: 02/28/2018 00:11   Ct Head Wo Contrast  Result Date: 03/03/2018 CLINICAL DATA:  Altered level of consciousness, unexplained. EXAM: CT HEAD WITHOUT CONTRAST TECHNIQUE: Contiguous axial images were obtained from the base of the skull through the vertex without intravenous contrast. COMPARISON:  Head CT 11/30/2017 FINDINGS: Brain: No intracranial hemorrhage, mass effect, or midline shift. Unchanged atrophy and chronic small vessel ischemia. Unchanged remote lacunar infarcts in the right greater than left basal ganglia. Small remote left parietal infarct again seen.  No cerebral edema. No hydrocephalus. The basilar cisterns are patent. No evidence of territorial infarct or acute ischemia.  No extra-axial or intracranial fluid collection. Vascular: Atherosclerosis of skullbase vasculature without hyperdense vessel or abnormal calcification. Skull: No fracture or focal lesion. Sinuses/Orbits: Paranasal sinuses and mastoid air cells are clear. The visualized orbits are unremarkable. Other: None. IMPRESSION: 1.  No acute intracranial abnormality. 2. Unchanged atrophy, chronic small vessel ischemia, and remote lacunar infarcts in the basal ganglia. Electronically Signed   By: Keith Rake M.D.   On: 03/16/2018 21:08   Dg Chest Port 1 View  Result Date: 03/14/2018 CLINICAL DATA:  Cardiac arrest. EXAM: PORTABLE CHEST 1 VIEW COMPARISON:  Radiograph earlier this day. FINDINGS: Cardiac enlargement partially obscured by large retrocardiac hiatal hernia. Slight decreased gaseous gastric distension from prior. Aortic atherosclerosis. Mild vascular congestion without overt pulmonary edema. Probable bibasilar atelectasis with possible small pleural effusions. Improved linear right lung base atelectasis from prior. No pneumothorax. IMPRESSION: 1. Probable bibasilar atelectasis and possible pleural effusions. Vascular congestion. 2. Cardiac enlargement, partially obscured by large hiatal hernia. Slight decreased gaseous gastric distension from prior. Electronically Signed   By: Keith Rake M.D.   On: 03/13/2018 23:22   Dg Chest Portable 1 View  Result Date: 03/09/2018 CLINICAL DATA:  Respiratory distress. EXAM: PORTABLE CHEST 1 VIEW COMPARISON:  12/18/2016. FINDINGS: Stable enlarged cardiac silhouette. Interval gaseous distention of the stomach, including a large hiatal hernia. Interval linear atelectasis at the right lung base. Clear left lung. Diffuse osteopenia. IMPRESSION: 1. Interval gaseous distention of the stomach, including a large hiatal hernia. 2. Interval linear  atelectasis at the right lung base. Electronically Signed   By: Claudie Revering M.D.   On: 03/09/2018 19:18   Dg Abd Portable 1v  Result Date: 02/28/2018 CLINICAL DATA:  Nasogastric tube placement EXAM: PORTABLE ABDOMEN - 1 VIEW COMPARISON:  Portable exam 0138 hours compared to 02/26/2018 and correlated with prior CT abdomen and pelvis exam of 12/18/2016 FINDINGS: Nasogastric tube coiled in a large hiatal hernia at the lower LEFT hemithorax. Gaseous distention of the distal stomach below the diaphragm. Air-filled loops of nondistended bowel throughout abdomen and pelvis. Bones demineralized. External pacing leads noted. IMPRESSION: Tip of nasogastric tube is coiled within a large hiatal hernia at the inferior LEFT chest. Electronically Signed   By: Lavonia Dana M.D.   On: 02/28/2018 02:00    EKG: Normal sinus rhythm with left axis deviation right bundle branch block with preventricular contraction  Weights: Filed Weights   02/28/2018 2244  Weight: 55.3 kg     Physical Exam: Blood pressure (!) 111/52, pulse 96, temperature (!) 101.1 F (38.4 C), resp. rate (!) 29, height 5\' 3"  (1.6 m), weight 55.3 kg, SpO2 96 %. Body mass index is 21.6 kg/m. General: Well developed, well nourished, in no acute distress. Head eyes ears nose throat: Normocephalic, atraumatic, sclera non-icteric, no xanthomas, nares are without discharge. No apparent thyromegaly and/or mass  Lungs: Normal respiratory effort.  Few wheezes, no rales, no rhonchi.  Heart: Slightly irregular with normal S1 S2.  3+ aortic murmur gallop, no rub, PMI is normal size and placement, carotid upstroke normal without bruit, jugular venous pressure is normal Abdomen: Soft, non-tender, non-distended with normoactive bowel sounds. No hepatomegaly. No rebound/guarding. No obvious abdominal masses. Abdominal aorta is normal size without bruit Extremities: Trace edema. no cyanosis, no clubbing, no ulcers  Peripheral : 2+ bilateral upper extremity pulses,  2+ bilateral femoral pulses, 2+ bilateral dorsal pedal pulse Neuro: Not alert and oriented. No facial asymmetry. No focal deficit. Moves all extremities spontaneously. Musculoskeletal: Normal muscle tone without kyphosis  Psych: Does not responds to questions appropriately with a normal affect.    Assessment: 77 year old female with known vascular disease chronic kidney disease stage IV mild aortic valve stenosis abnormal EKG with apparent cardiorespiratory arrest with non-ST elevation myocardial infarction and no current evidence of worsening cardiovascular symptoms or heart failure  Plan: 1.  Continue supportive care looking for other causes of cardiorespiratory arrest 2.  No further intervention at this time from the cardiovascular standpoint due to severe chronic kidney disease stage IV and no current symptoms of progression of cardiovascular concerns 3.  No addition of heparin at this time due to previous subarachnoid hemorrhage and no current evidence of new EKG changes consistent with ST elevation myocardial infarction 4.  Echocardiogram for LV systolic dysfunction valvular heart disease contributing to above 5.  Continue antibiotic treatment for possible causes of cardiorespiratory arrest 6.  Further treatment options after above  Signed, Corey Skains M.D. [facility] 02/28/2018, 8:22 AM

## 2018-02-28 NOTE — Progress Notes (Signed)
Chaplain spoke with the patient's nurse, and later the son and daughter-in-law, about the patient's progress. Chaplain reminded all that chaplain services are available, as needed by the family. Family was polite but did not need a chaplain at this point. Son provided glowing praise for the on-call chaplain and was thankful for how his religious beliefs were respected. Son is not religious. Chaplain notified family that the unit chaplain, Marlou Starks, would check in later today.

## 2018-02-28 NOTE — ED Notes (Signed)
Pt regained ability to maintain respiratory effort with assistance of bi-pap

## 2018-03-01 ENCOUNTER — Encounter: Payer: Self-pay | Admitting: General Practice

## 2018-03-01 DIAGNOSIS — E43 Unspecified severe protein-calorie malnutrition: Secondary | ICD-10-CM

## 2018-03-01 LAB — BASIC METABOLIC PANEL
ANION GAP: 9 (ref 5–15)
BUN: 70 mg/dL — ABNORMAL HIGH (ref 8–23)
CHLORIDE: 112 mmol/L — AB (ref 98–111)
CO2: 23 mmol/L (ref 22–32)
Calcium: 8.1 mg/dL — ABNORMAL LOW (ref 8.9–10.3)
Creatinine, Ser: 4.13 mg/dL — ABNORMAL HIGH (ref 0.44–1.00)
GFR calc non Af Amer: 9 mL/min — ABNORMAL LOW (ref >60)
GFR, EST AFRICAN AMERICAN: 11 mL/min — AB (ref >60)
Glucose, Bld: 126 mg/dL — ABNORMAL HIGH (ref 70–99)
Potassium: 3.7 mmol/L (ref 3.5–5.1)
Sodium: 144 mmol/L (ref 135–145)

## 2018-03-01 LAB — CBC WITH DIFFERENTIAL/PLATELET
Basophils Absolute: 0 10*3/uL (ref 0–0.1)
Basophils Relative: 0 %
Eosinophils Absolute: 0 10*3/uL (ref 0–0.7)
Eosinophils Relative: 0 %
HEMATOCRIT: 25.9 % — AB (ref 35.0–47.0)
HEMOGLOBIN: 8.7 g/dL — AB (ref 12.0–16.0)
LYMPHS ABS: 0.4 10*3/uL — AB (ref 1.0–3.6)
LYMPHS PCT: 4 %
MCH: 32.1 pg (ref 26.0–34.0)
MCHC: 33.5 g/dL (ref 32.0–36.0)
MCV: 95.9 fL (ref 80.0–100.0)
MONO ABS: 0.6 10*3/uL (ref 0.2–0.9)
Monocytes Relative: 5 %
NEUTROS ABS: 9.7 10*3/uL — AB (ref 1.4–6.5)
Neutrophils Relative %: 91 %
Platelets: 100 10*3/uL — ABNORMAL LOW (ref 150–440)
RBC: 2.7 MIL/uL — ABNORMAL LOW (ref 3.80–5.20)
RDW: 16.2 % — ABNORMAL HIGH (ref 11.5–14.5)
WBC: 10.7 10*3/uL (ref 3.6–11.0)

## 2018-03-01 LAB — T3, FREE: T3 FREE: 1.7 pg/mL — AB (ref 2.0–4.4)

## 2018-03-01 LAB — GLUCOSE, CAPILLARY: Glucose-Capillary: 122 mg/dL — ABNORMAL HIGH (ref 70–99)

## 2018-03-01 LAB — HEMOGLOBIN A1C
Hgb A1c MFr Bld: 5.3 % (ref 4.8–5.6)
Mean Plasma Glucose: 105 mg/dL

## 2018-03-01 LAB — PROCALCITONIN: Procalcitonin: 4.94 ng/mL

## 2018-03-01 LAB — ECHOCARDIOGRAM COMPLETE
Height: 63 in
WEIGHTICAEL: 1950.63 [oz_av]

## 2018-03-01 LAB — TROPONIN I: Troponin I: 2.57 ng/mL (ref <0.03)

## 2018-03-01 MED ORDER — MELATONIN 5 MG PO TABS
2.5000 mg | ORAL_TABLET | Freq: Every day | ORAL | Status: DC
  Administered 2018-03-01 – 2018-03-02 (×2): 2.5 mg via ORAL
  Filled 2018-03-01 (×3): qty 0.5

## 2018-03-01 MED ORDER — LORAZEPAM 2 MG/ML IJ SOLN
0.5000 mg | Freq: Once | INTRAMUSCULAR | Status: AC
  Administered 2018-03-01: 0.5 mg via INTRAVENOUS
  Filled 2018-03-01: qty 1

## 2018-03-01 MED ORDER — ASPIRIN 81 MG PO CHEW
81.0000 mg | CHEWABLE_TABLET | Freq: Every day | ORAL | Status: DC
  Administered 2018-03-01 – 2018-03-03 (×3): 81 mg via ORAL
  Filled 2018-03-01 (×3): qty 1

## 2018-03-01 MED ORDER — PANTOPRAZOLE SODIUM 40 MG IV SOLR
40.0000 mg | Freq: Every day | INTRAVENOUS | Status: DC
  Administered 2018-03-01 – 2018-03-03 (×3): 40 mg via INTRAVENOUS
  Filled 2018-03-01 (×3): qty 40

## 2018-03-01 MED ORDER — CARBIDOPA-LEVODOPA 25-100 MG PO TABS
1.0000 | ORAL_TABLET | Freq: Four times a day (QID) | ORAL | Status: DC
  Administered 2018-03-01 – 2018-03-03 (×8): 1 via ORAL
  Filled 2018-03-01 (×12): qty 1

## 2018-03-01 NOTE — Progress Notes (Signed)
Took over for [employer], went into room to assess patient at 15:00. Was unable to get patient to awaken and follow commands. Notified Dr. Mortimer Fries, CBG 122. MD acknowledge and ordered to continue to monitor for now. Holding PO medications at this time.

## 2018-03-01 NOTE — Progress Notes (Signed)
[facility]  ROUNDING NOTE   Subjective:  Patient very well-known to [place]. We saw her last week for management of chronic kidney disease stage IV. When she was in the office EGFR was 16. Patient unfortunately suffered a cardiac arrest upon being brought here. Function is worse at the moment with an EGFR of 11.   Objective:  Vital signs in last 24 hours:  Temp:  [99 F (37.2 C)-101.1 F (38.4 C)] 99 F (37.2 C) (09/10 0630) Pulse Rate:  [87-111] 104 (09/10 0630) Resp:  [15-38] 22 (09/10 0630) BP: (83-126)/(44-73) 117/64 (09/10 0630) SpO2:  [92 %-100 %] 97 % (09/10 0630) Weight:  [56 kg] 56 kg (09/10 0426)  Weight change: 0.7 kg Filed Weights   03/10/2018 2244 03/01/18 0426  Weight: 55.3 kg 56 kg    Intake/Output: I/O last 3 completed shifts: In: 1020.9 [I.V.:685.4; IV Piggyback:335.5] Out: 600 [Urine:600]   Intake/Output this shift:  Total I/O In: 60 [P.O.:60] Out: -   Physical Exam: General: No acute distress  Head: Normocephalic, atraumatic. Moist oral mucosal membranes  Eyes: Anicteric  Neck: Supple, trachea midline  Lungs:  Clear to auscultation, normal effort  Heart: S1S2 no rubs  Abdomen:  Soft, nontender, bowel sounds present  Extremities: 1+ peripheral edema.  Neurologic: Awake, slow to respond to questions  Skin: No lesions       Basic Metabolic Panel: Recent Labs  Lab 02/20/2018 1841 03/16/2018 2314 03/01/18 0532  NA 139 142 144  K 5.0 3.6 3.7  CL 108 110 112*  CO2 14* 21* 23  GLUCOSE 340* 202* 126*  BUN 45* 52* 70*  CREATININE 3.57* 3.47* 4.13*  CALCIUM 8.9 8.2* 8.1*  MG  --  2.0  --   PHOS  --  4.1  --     Liver Function Tests: Recent Labs  Lab 03/06/2018 1841 02/23/2018 2314  AST 31 82*  ALT 5 10  ALKPHOS 81 76  BILITOT 0.7 0.6  PROT 6.3* 6.2*  ALBUMIN 3.7 3.4*   Recent Labs  Lab 03/10/2018 1841  LIPASE 85*   No results for input(s): AMMONIA in the last 168 hours.  CBC: Recent Labs  Lab 03/21/2018 1841  03/01/18 0532  WBC 9.7 10.7  NEUTROABS 4.9 9.7*  HGB 10.6* 8.7*  HCT 33.3* 25.9*  MCV 100.5* 95.9  PLT 134* 100*    Cardiac Enzymes: Recent Labs  Lab 03/06/2018 2043 03/19/2018 2314 02/28/18 0446 02/28/18 1057 03/01/18 0532  TROPONINI 0.07* 0.76* 3.57* 4.43* 2.57*    BNP: Invalid input(s): POCBNP  CBG: Recent Labs  Lab 03/18/2018 2232  GLUCAP 9*    Microbiology: Results for orders placed or performed during the hospital encounter of 03/01/2018  Blood culture (routine x 2)     Status: None (Preliminary result)   Collection Time: 03/01/2018  7:54 PM  Result Value Ref Range Status   Specimen Description BLOOD RIGHT WRIST  Final   Special Requests   Final    BOTTLES DRAWN AEROBIC AND ANAEROBIC Blood Culture results may not be optimal due to an excessive volume of blood received in culture bottles   Culture   Final    NO GROWTH 2 DAYS Performed at [facility], [address], [place], [place] [address]    Report Status PENDING  Incomplete  MRSA PCR Screening     Status: None   Collection Time: 03/06/2018 10:39 PM  Result Value Ref Range Status   MRSA by PCR NEGATIVE NEGATIVE Final  Comment:        The GeneXpert MRSA Assay (FDA approved for NASAL specimens only), is one component of a comprehensive MRSA colonization surveillance program. It is not intended to diagnose MRSA infection nor to guide or monitor treatment for MRSA infections. Performed at [facility], [address] [place], [place] [address]   Blood culture (routine x 2)     Status: None (Preliminary result)   Collection Time: 03/11/2018 11:12 PM  Result Value Ref Range Status   Specimen Description BLOOD LEFT ARM  Final   Special Requests   Final    BOTTLES DRAWN AEROBIC AND ANAEROBIC Blood Culture results may not be optimal due to an inadequate volume of blood received in culture bottles   Culture   Final    NO GROWTH 2 DAYS Performed at [facility], [address], [place], [place] [address]    Report Status PENDING  Incomplete    Coagulation Studies: Recent Labs    03/01/2018 2332/08/04  LABPROT 14.2  INR 1.11    Urinalysis: Recent Labs    03/18/2018 08/04/2049  COLORURINE YELLOW*  LABSPEC 1.013  PHURINE 6.0  GLUCOSEU 150*  HGBUR SMALL*  BILIRUBINUR NEGATIVE  KETONESUR NEGATIVE  PROTEINUR 100*  NITRITE NEGATIVE  LEUKOCYTESUR NEGATIVE      Imaging: Dg Abd 1 View  Result Date: 02/28/2018 CLINICAL DATA:  Abdominal pain EXAM: ABDOMEN - 1 VIEW COMPARISON:  None. FINDINGS: Gaseous distention of the stomach and small bowel. No dilated small bowel is visualized. Incompletely visualized cardiomegaly. IMPRESSION: Gas distended stomach. No radiographic evidence of small-bowel obstruction. Electronically Signed   By: Ulyses Jarred M.D.   On: 02/28/2018 00:11   Ct Head Wo Contrast  Result Date: 02/20/2018 CLINICAL DATA:  Altered level of consciousness, unexplained. EXAM: CT HEAD WITHOUT CONTRAST TECHNIQUE: Contiguous axial images were obtained from the base of the skull through the vertex without intravenous contrast. COMPARISON:  Head CT 11/30/2017 FINDINGS: Brain: No intracranial hemorrhage, mass effect, or midline shift. Unchanged atrophy and chronic small vessel ischemia. Unchanged remote lacunar infarcts in the right greater than left basal ganglia. Small remote left parietal infarct again seen. No cerebral edema. No hydrocephalus. The basilar cisterns are patent. No evidence of territorial infarct or acute ischemia. No extra-axial or intracranial fluid collection. Vascular: Atherosclerosis of skullbase vasculature without hyperdense vessel or abnormal calcification. Skull: No fracture or focal lesion. Sinuses/Orbits: Paranasal sinuses and mastoid air cells are clear. The visualized orbits are unremarkable. Other: None. IMPRESSION: 1.  No acute intracranial abnormality. 2. Unchanged atrophy, chronic small vessel ischemia, and remote lacunar infarcts in the basal  ganglia. Electronically Signed   By: Keith Rake M.D.   On: 02/22/2018 21:08   Dg Chest Port 1 View  Result Date: 02/24/2018 CLINICAL DATA:  Cardiac arrest. EXAM: PORTABLE CHEST 1 VIEW COMPARISON:  Radiograph earlier this day. FINDINGS: Cardiac enlargement partially obscured by large retrocardiac hiatal hernia. Slight decreased gaseous gastric distension from prior. Aortic atherosclerosis. Mild vascular congestion without overt pulmonary edema. Probable bibasilar atelectasis with possible small pleural effusions. Improved linear right lung base atelectasis from prior. No pneumothorax. IMPRESSION: 1. Probable bibasilar atelectasis and possible pleural effusions. Vascular congestion. 2. Cardiac enlargement, partially obscured by large hiatal hernia. Slight decreased gaseous gastric distension from prior. Electronically Signed   By: Keith Rake M.D.   On: 03/17/2018 23:22   Dg Chest Portable 1 View  Result Date: 03/07/2018 CLINICAL DATA:  Respiratory distress. EXAM: PORTABLE CHEST 1 VIEW COMPARISON:  12/18/2016. FINDINGS: Stable enlarged cardiac silhouette. Interval gaseous distention of the stomach, including a large hiatal hernia. Interval linear atelectasis at the right lung base. Clear left lung. Diffuse osteopenia. IMPRESSION: 1. Interval gaseous distention of the stomach, including a large hiatal hernia. 2. Interval linear atelectasis at the right lung base. Electronically Signed   By: Claudie Revering M.D.   On: 03/07/2018 19:18   Dg Abd Portable 1v  Result Date: 02/28/2018 CLINICAL DATA:  Nasogastric tube placement EXAM: PORTABLE ABDOMEN - 1 VIEW COMPARISON:  Portable exam 0138 hours compared to 03/11/2018 and correlated with prior CT abdomen and pelvis exam of 12/18/2016 FINDINGS: Nasogastric tube coiled in a large hiatal hernia at the lower LEFT hemithorax. Gaseous distention of the distal stomach below the diaphragm. Air-filled loops of nondistended bowel throughout abdomen and pelvis. Bones  demineralized. External pacing leads noted. IMPRESSION: Tip of nasogastric tube is coiled within a large hiatal hernia at the inferior LEFT chest. Electronically Signed   By: Lavonia Dana M.D.   On: 02/28/2018 02:00     Medications:   . sodium chloride Stopped (03/01/18 0031)  . ampicillin-sulbactam (UNASYN) IV Stopped (02/28/18 1822)   . aspirin  81 mg Oral Daily  . atorvastatin  80 mg Oral QPM  . carbidopa-levodopa  1 tablet Oral QID  . chlorhexidine  15 mL Mouth Rinse BID  . clopidogrel  75 mg Oral Daily  . feeding supplement (NEPRO CARB STEADY)  237 mL Oral BID BM  . heparin injection (subcutaneous)  5,000 Units Subcutaneous Q8H  . ipratropium-albuterol  3 mL Nebulization Q4H  . mouth rinse  15 mL Mouth Rinse q12n4p  . Melatonin  2.5 mg Oral QHS  . pantoprazole (PROTONIX) IV  40 mg Intravenous Daily  . pramipexole  1 mg Oral TID   sodium chloride, acetaminophen, ketorolac  Assessment/ Plan:  77 y.o. female with past medical history of hypertension, Parkinson's disorder, GI bleed, overactive bladder, iron deficiency anemia, hyperlipidemia, chronic musculoskeletal low back pain, chronic lower extremity edema, GERD, right basal ganglia CVA and vitamin D deficiency admitted with cardiac arrest.   1.  Acute renal failure/chronic kidney disease stage IV baseline EGFR 16.  Suspect that her acute renal failure now attributable to recent cardiac arrest.  No urgent indication to initiate dialysis however we may need to consider this given low EGFR of 11.  As an outpatient they were considering peritoneal dialysis however she could likely not tolerate a procedure with anesthesia at the moment.  Therefore we may need to consider hemodialysis.  2.  Anemia of chronic kidney disease.  Hemoglobin down to 8.7.  We may need to start the patient on Epogen but hold off for now given recent cardiac event.  3.  Secondary hyperparathyroidism.  No indication for calcitriol or binders at the moment.   Continue to monitor bone mineral metabolism parameters.   LOS: 2 Izek Corvino 9/10/201910:35 AM

## 2018-03-01 NOTE — Progress Notes (Signed)
[facility] Encounter Note  Patient: Brianna Coleman / Admit Date: 03/04/2018 / Date of Encounter: 03/01/2018, 6:58 PM   Subjective: Patient is slightly improved since yesterday with some conversation with family and some more alertness.  No evidence of significant complaints at this time.  No evidence of congestive heart failure or anginal symptoms.  Opponent level elevated at 4.4 consistent with non-ST elevation myocardial infarction.  Discussed with family about concerns of myocardial infarction and appropriate approach noninvasive at this time due to concerns of multiple issues with the patient  Review of Systems: Not assessed due to above Objective: Telemetry: This tachycardia Physical Exam: Blood pressure (!) 96/48, pulse (!) 106, temperature 99.1 F (37.3 C), temperature source Bladder, resp. rate (!) 26, height 5\' 3"  (1.6 m), weight 56 kg, SpO2 96 %. Body mass index is 21.87 kg/m. General: Not well well developed, well nourished, in no acute distress. Head: Normocephalic, atraumatic, sclera non-icteric, no xanthomas, nares are without discharge. Neck: No apparent masses Lungs: Normal respirations with no wheezes, no rhonchi, no rales , no crackles   Heart: Regular rate and rhythm, normal S1 S2, no 2-3+ aortic murmur, no rub, no gallop, PMI is normal size and placement, carotid upstroke normal without bruit, jugular venous pressure normal Abdomen: Soft, non-tender, non-distended with normoactive bowel sounds. No hepatosplenomegaly. Abdominal aorta is normal size without bruit Extremities: No edema, no clubbing, no cyanosis, no ulcers,  Peripheral: 2+ radial, 2+ femoral, 2+ dorsal pedal pulses Neuro: Not alert and oriented. Moves all extremities spontaneously. Psych: Does not responds to questions appropriately with a normal affect.   Intake/Output Summary (Last 24 hours) at 03/01/2018 1858 Last data filed at 03/01/2018 1643 Gross per 24 hour  Intake 659.74 ml   Output 610 ml  Net 49.74 ml    Inpatient Medications:  . aspirin  81 mg Oral Daily  . atorvastatin  80 mg Oral QPM  . carbidopa-levodopa  1 tablet Oral QID  . chlorhexidine  15 mL Mouth Rinse BID  . clopidogrel  75 mg Oral Daily  . feeding supplement (NEPRO CARB STEADY)  237 mL Oral BID BM  . heparin injection (subcutaneous)  5,000 Units Subcutaneous Q8H  . ipratropium-albuterol  3 mL Nebulization Q4H  . mouth rinse  15 mL Mouth Rinse q12n4p  . Melatonin  2.5 mg Oral QHS  . pantoprazole (PROTONIX) IV  40 mg Intravenous Daily  . pramipexole  1 mg Oral TID   Infusions:  . sodium chloride Stopped (03/01/18 0031)  . ampicillin-sulbactam (UNASYN) IV 3 g (03/01/18 1809)    Labs: Recent Labs    03/13/2018 2314 03/01/18 0532  NA 142 144  K 3.6 3.7  CL 110 112*  CO2 21* 23  GLUCOSE 202* 126*  BUN 52* 70*  CREATININE 3.47* 4.13*  CALCIUM 8.2* 8.1*  MG 2.0  --   PHOS 4.1  --    Recent Labs    03/02/2018 1841 02/28/2018 2314  AST 31 82*  ALT 5 10  ALKPHOS 81 76  BILITOT 0.7 0.6  PROT 6.3* 6.2*  ALBUMIN 3.7 3.4*   Recent Labs    03/17/2018 1841 03/01/18 0532  WBC 9.7 10.7  NEUTROABS 4.9 9.7*  HGB 10.6* 8.7*  HCT 33.3* 25.9*  MCV 100.5* 95.9  PLT 134* 100*   Recent Labs    02/20/2018 2314 02/28/18 0446 02/28/18 1057 03/01/18 0532  TROPONINI 0.76* 3.57* 4.43* 2.57*   Invalid input(s): POCBNP Recent Labs    03/14/2018 2313  HGBA1C 5.3     Weights: Filed Weights   03/08/2018 2244 03/01/18 0426  Weight: 55.3 kg 56 kg     Radiology/Studies:  Dg Abd 1 View  Result Date: 02/28/2018 CLINICAL DATA:  Abdominal pain EXAM: ABDOMEN - 1 VIEW COMPARISON:  None. FINDINGS: Gaseous distention of the stomach and small bowel. No dilated small bowel is visualized. Incompletely visualized cardiomegaly. IMPRESSION: Gas distended stomach. No radiographic evidence of small-bowel obstruction. Electronically Signed   By: Ulyses Jarred M.D.   On: 02/28/2018 00:11   Ct Head Wo  Contrast  Result Date: 02/21/2018 CLINICAL DATA:  Altered level of consciousness, unexplained. EXAM: CT HEAD WITHOUT CONTRAST TECHNIQUE: Contiguous axial images were obtained from the base of the skull through the vertex without intravenous contrast. COMPARISON:  Head CT 11/30/2017 FINDINGS: Brain: No intracranial hemorrhage, mass effect, or midline shift. Unchanged atrophy and chronic small vessel ischemia. Unchanged remote lacunar infarcts in the right greater than left basal ganglia. Small remote left parietal infarct again seen. No cerebral edema. No hydrocephalus. The basilar cisterns are patent. No evidence of territorial infarct or acute ischemia. No extra-axial or intracranial fluid collection. Vascular: Atherosclerosis of skullbase vasculature without hyperdense vessel or abnormal calcification. Skull: No fracture or focal lesion. Sinuses/Orbits: Paranasal sinuses and mastoid air cells are clear. The visualized orbits are unremarkable. Other: None. IMPRESSION: 1.  No acute intracranial abnormality. 2. Unchanged atrophy, chronic small vessel ischemia, and remote lacunar infarcts in the basal ganglia. Electronically Signed   By: Keith Rake M.D.   On: 03/07/2018 21:08   Dg Chest Port 1 View  Result Date: 03/19/2018 CLINICAL DATA:  Cardiac arrest. EXAM: PORTABLE CHEST 1 VIEW COMPARISON:  Radiograph earlier this day. FINDINGS: Cardiac enlargement partially obscured by large retrocardiac hiatal hernia. Slight decreased gaseous gastric distension from prior. Aortic atherosclerosis. Mild vascular congestion without overt pulmonary edema. Probable bibasilar atelectasis with possible small pleural effusions. Improved linear right lung base atelectasis from prior. No pneumothorax. IMPRESSION: 1. Probable bibasilar atelectasis and possible pleural effusions. Vascular congestion. 2. Cardiac enlargement, partially obscured by large hiatal hernia. Slight decreased gaseous gastric distension from prior.  Electronically Signed   By: Keith Rake M.D.   On: 03/18/2018 23:22   Dg Chest Portable 1 View  Result Date: 02/25/2018 CLINICAL DATA:  Respiratory distress. EXAM: PORTABLE CHEST 1 VIEW COMPARISON:  12/18/2016. FINDINGS: Stable enlarged cardiac silhouette. Interval gaseous distention of the stomach, including a large hiatal hernia. Interval linear atelectasis at the right lung base. Clear left lung. Diffuse osteopenia. IMPRESSION: 1. Interval gaseous distention of the stomach, including a large hiatal hernia. 2. Interval linear atelectasis at the right lung base. Electronically Signed   By: Claudie Revering M.D.   On: 02/28/2018 19:18   Dg Abd Portable 1v  Result Date: 02/28/2018 CLINICAL DATA:  Nasogastric tube placement EXAM: PORTABLE ABDOMEN - 1 VIEW COMPARISON:  Portable exam 0138 hours compared to 03/07/2018 and correlated with prior CT abdomen and pelvis exam of 12/18/2016 FINDINGS: Nasogastric tube coiled in a large hiatal hernia at the lower LEFT hemithorax. Gaseous distention of the distal stomach below the diaphragm. Air-filled loops of nondistended bowel throughout abdomen and pelvis. Bones demineralized. External pacing leads noted. IMPRESSION: Tip of nasogastric tube is coiled within a large hiatal hernia at the inferior LEFT chest. Electronically Signed   By: Lavonia Dana M.D.   On: 02/28/2018 02:00     Assessment and Recommendation  77 y.o. female with known cardiovascular disease peripheral vascular disease acute on chronic  kidney injury with glomerular filtration rate of 14 aortic valve stenosis with apparent cardiac arrest most consistent with possible aspiration or other causes having acute myocardial infarction with no current residual symptoms   1.  Continue medication management treatment for cardiovascular disease and non-ST elevation myocardial infarction including antiplatelet medication management with aspirin and Plavix 2.  High intensity cholesterol therapy 3.  Follow for any  further significant symptoms requiring further adjustments of medication management 4.  Discussed with family in depth about cardiovascular concerns and difficulty of further intervention and family is understanding and wishes no further invasive procedures to be performed at this time  Signed, Serafina Royals M.D. FACC

## 2018-03-01 NOTE — Progress Notes (Signed)
Follow up - Critical Care Medicine Note  Patient Details:    Brianna Coleman is an 77 y.o. female. Severe Parkinson's, orthostatic hypertension, REM behavior disorder, hyperlipidemia, aortic stenosis, hypertension, CVA,status post episode of epigastric pain, brief cardiac arrest and respiratory distress. Has improved mentation and weaned off of BiPAP  Lines, Airways, Drains: Urethral Catheter Megan RN Non-latex 14 Fr. (Active)  Indication for Insertion or Continuance of Catheter Unstable critical patients (first 24-48 hours) 03/01/2018  4:00 AM  Site Assessment Clean;Intact 03/01/2018  4:00 AM  Catheter Maintenance Bag below level of bladder;Catheter secured;Drainage bag/tubing not touching floor;No dependent loops;Insertion date on drainage bag;Seal intact 03/01/2018  4:00 AM  Collection Container Standard drainage bag 03/01/2018  4:00 AM  Output (mL) 100 mL 03/01/2018  4:00 AM    Anti-infectives:  Anti-infectives (From admission, onward)   Start     Dose/Rate Route Frequency Ordered Stop   02/28/18 1800  Ampicillin-Sulbactam (UNASYN) 3 g in sodium chloride 0.9 % 100 mL IVPB     3 g 200 mL/hr over 30 Minutes Intravenous Every 24 hours 02/28/18 1140     02/28/18 0200  piperacillin-tazobactam (ZOSYN) IVPB 3.375 g  Status:  Discontinued     3.375 g 12.5 mL/hr over 240 Minutes Intravenous Every 12 hours 03/09/2018 2349 02/28/18 1008   03/16/2018 1930  ceFEPIme (MAXIPIME) 1 g in sodium chloride 0.9 % 100 mL IVPB     1 g 200 mL/hr over 30 Minutes Intravenous  Once 03/06/2018 1926 03/17/2018 2001      Microbiology: Results for orders placed or performed during the hospital encounter of 02/26/2018  Blood culture (routine x 2)     Status: None (Preliminary result)   Collection Time: 02/25/2018  7:54 PM  Result Value Ref Range Status   Specimen Description BLOOD RIGHT WRIST  Final   Special Requests   Final    BOTTLES DRAWN AEROBIC AND ANAEROBIC Blood Culture results may not be optimal due to an  excessive volume of blood received in culture bottles   Culture   Final    NO GROWTH 2 DAYS Performed at [facility], [address] [place], [place] [address]    Report Status PENDING  Incomplete  MRSA PCR Screening     Status: None   Collection Time: 03/13/2018 10:39 PM  Result Value Ref Range Status   MRSA by PCR NEGATIVE NEGATIVE Final    Comment:        The GeneXpert MRSA Assay (FDA approved for NASAL specimens only), is one component of a comprehensive MRSA colonization surveillance program. It is not intended to diagnose MRSA infection nor to guide or monitor treatment for MRSA infections. Performed at [facility], [address] [place], [place] [address]   Blood culture (routine x 2)     Status: None (Preliminary result)   Collection Time: 03/13/2018 11:12 PM  Result Value Ref Range Status   Specimen Description BLOOD LEFT ARM  Final   Special Requests   Final    BOTTLES DRAWN AEROBIC AND ANAEROBIC Blood Culture results may not be optimal due to an inadequate volume of blood received in culture bottles   Culture   Final    NO GROWTH 2 DAYS Performed at [facility], [address] [place], [place] [address]    Report Status PENDING  Incomplete    Studies: Dg Abd 1 View  Result Date: 02/28/2018 CLINICAL DATA:  Abdominal pain EXAM: ABDOMEN - 1 VIEW COMPARISON:  None. FINDINGS:  Gaseous distention of the stomach and small bowel. No dilated small bowel is visualized. Incompletely visualized cardiomegaly. IMPRESSION: Gas distended stomach. No radiographic evidence of small-bowel obstruction. Electronically Signed   By: Ulyses Jarred M.D.   On: 02/28/2018 00:11   Ct Head Wo Contrast  Result Date: 03/08/2018 CLINICAL DATA:  Altered level of consciousness, unexplained. EXAM: CT HEAD WITHOUT CONTRAST TECHNIQUE: Contiguous axial images were obtained from the base of the skull through the vertex without intravenous contrast. COMPARISON:  Head CT  11/30/2017 FINDINGS: Brain: No intracranial hemorrhage, mass effect, or midline shift. Unchanged atrophy and chronic small vessel ischemia. Unchanged remote lacunar infarcts in the right greater than left basal ganglia. Small remote left parietal infarct again seen. No cerebral edema. No hydrocephalus. The basilar cisterns are patent. No evidence of territorial infarct or acute ischemia. No extra-axial or intracranial fluid collection. Vascular: Atherosclerosis of skullbase vasculature without hyperdense vessel or abnormal calcification. Skull: No fracture or focal lesion. Sinuses/Orbits: Paranasal sinuses and mastoid air cells are clear. The visualized orbits are unremarkable. Other: None. IMPRESSION: 1.  No acute intracranial abnormality. 2. Unchanged atrophy, chronic small vessel ischemia, and remote lacunar infarcts in the basal ganglia. Electronically Signed   By: Keith Rake M.D.   On: 03/03/2018 21:08   Dg Chest Port 1 View  Result Date: 02/22/2018 CLINICAL DATA:  Cardiac arrest. EXAM: PORTABLE CHEST 1 VIEW COMPARISON:  Radiograph earlier this day. FINDINGS: Cardiac enlargement partially obscured by large retrocardiac hiatal hernia. Slight decreased gaseous gastric distension from prior. Aortic atherosclerosis. Mild vascular congestion without overt pulmonary edema. Probable bibasilar atelectasis with possible small pleural effusions. Improved linear right lung base atelectasis from prior. No pneumothorax. IMPRESSION: 1. Probable bibasilar atelectasis and possible pleural effusions. Vascular congestion. 2. Cardiac enlargement, partially obscured by large hiatal hernia. Slight decreased gaseous gastric distension from prior. Electronically Signed   By: Keith Rake M.D.   On: 02/20/2018 23:22   Dg Chest Portable 1 View  Result Date: 02/22/2018 CLINICAL DATA:  Respiratory distress. EXAM: PORTABLE CHEST 1 VIEW COMPARISON:  12/18/2016. FINDINGS: Stable enlarged cardiac silhouette. Interval gaseous  distention of the stomach, including a large hiatal hernia. Interval linear atelectasis at the right lung base. Clear left lung. Diffuse osteopenia. IMPRESSION: 1. Interval gaseous distention of the stomach, including a large hiatal hernia. 2. Interval linear atelectasis at the right lung base. Electronically Signed   By: Claudie Revering M.D.   On: 03/08/2018 19:18   Dg Abd Portable 1v  Result Date: 02/28/2018 CLINICAL DATA:  Nasogastric tube placement EXAM: PORTABLE ABDOMEN - 1 VIEW COMPARISON:  Portable exam 0138 hours compared to 03/19/2018 and correlated with prior CT abdomen and pelvis exam of 12/18/2016 FINDINGS: Nasogastric tube coiled in a large hiatal hernia at the lower LEFT hemithorax. Gaseous distention of the distal stomach below the diaphragm. Air-filled loops of nondistended bowel throughout abdomen and pelvis. Bones demineralized. External pacing leads noted. IMPRESSION: Tip of nasogastric tube is coiled within a large hiatal hernia at the inferior LEFT chest. Electronically Signed   By: Lavonia Dana M.D.   On: 02/28/2018 02:00    Consults: Treatment Team:  Hermelinda Dellen, DO Corey Skains, MD Anthonette Legato, MD   Subjective:    Overnight Issues: has some confusion overnight otherwise respiratory status has improved. Awake alert and nteractive this morning  Objective:  Vital signs for last 24 hours: Temp:  [99 F (37.2 C)-101.1 F (38.4 C)] 99 F (37.2 C) (09/10 0630) Pulse Rate:  [87-111] 104 (09/10  0630) Resp:  [15-38] 22 (09/10 0630) BP: (83-126)/(44-73) 117/64 (09/10 0630) SpO2:  [92 %-100 %] 97 % (09/10 0630) Weight:  [56 kg] 56 kg (09/10 0426)  Hemodynamic parameters for last 24 hours:    Intake/Output from previous day: 09/09 0701 - 09/10 0700 In: 459.7 [I.V.:359.7; IV Piggyback:100] Out: 500 [Urine:500]  Intake/Output this shift: No intake/output data recorded.  Vent settings for last 24 hours:    Physical Exam:  Vital signs: Please see the above  listed vital signs HEENT: Trachea midline, no oral lesions appreciated, no jugular venous distention, on nasal cannula at 3 L Cardiovascular: Regular rate and rhythm Pulmonary: Clear to auscultation Abdominal: Positive bowel sounds, soft exam Extremities: No clubbing cyanosis or edema noted Neurologic: Moves all extremities, is awake and interactive today did have some confusion last night  Assessment/Plan:   Respiratory failure. Significantly improved, most likely multifactorial to include aspiration, pulmonary edema. Patient has been weaned off of BiPAP, presently on supplemental oxygen via nasal cannula, is empirically being treated with Unasyn, albuterol, Atrovent, not actively wheezing at this time  Severe Parkinson's with dementia. Placed back on her Sinemet  Orthostatic hypotension. Most likely in association with her Parkinson's  Positive cardiac enzymes. Peak troponin was 4.43, has decreased now to 2.57. Has been seen by cardiology and felt secondary to all of her comorbidities including chronic renal failure conservative management would be best  Chronic renal failure.She is followed by Dr. Holley Raring, will consult at this time no acute indication for hemodialysis, will do our best to stabilize hemodynamics and avoid nephrotoxic agents  Anemia. No evidence of active bleeding  Patient has improved significantly, will watch this morning may be stable for floor transfer  Ritisha Deitrick 03/01/2018  *Care during the described time interval was provided by me and/or other providers on the critical care team.  I have reviewed this patient's available data, including medical history, events of note, physical examination and test results as part of my evaluation.

## 2018-03-01 NOTE — Progress Notes (Signed)
   03/01/18 1350  Clinical Encounter Type  Visited With Patient not available  Visit Type Follow-up  Spiritual Encounters  Spiritual Needs Prayer   Chaplain attempted follow up; patient sleeping and no family present.  Chaplain offered silent and energetic prayers for patient, family and care team.

## 2018-03-01 NOTE — Progress Notes (Signed)
Speech Language Pathology Treatment: Dysphagia  Brianna Coleman Details Name: Brianna Coleman MRN: [id number] DOB: May 31, 1941 Today's Date: 03/01/2018 Time: 0825-0930 SLP Time Calculation (min) (ACUTE ONLY): 65 min  Assessment / Plan / Recommendation Clinical Impression  Brianna Coleman seen for ongoing assessment of toleration of oral diet. Son present in room today and helped to feed Brianna Coleman during breakfast meal; she appeared fatigued and needed support to sit upright w/ head forward. Brianna Coleman continues to present w/ fairly adequate oropharyngeal phase swallowing function (min oral phase deficits noted w/ puree today mixed w/ crushed medication) but is at Mild risk for dysphagia, aspiration d/t BASELINE Neurological deficits (Parkinson's Dis.) in light of Acute illness and hospitalization. When following general aspiration precautions as she has been doing at home per family report, Brianna Coleman appears at reduced risk for aspiration. Family stated they follow aspiration precautions post previous dysphagia tx in past secondary to Brianna Coleman's dx of Parkinson's Dis(no straws, no mixed consistencies).  Brianna Coleman consumed trials of thin and Nectar(Nepro) liquids Via Cup and purees w/ no immediate, overt s/s of aspiration noted; a delayed cough noted x1 trial of Nepro(may have been the residue?). No decline in respiratory status(O2 sats remained upper 90s% w/ no increase/decrease in RR) and no change in vocal quality appreciated during/post trials when assessed. Oral phase c/b fairly adequate oral control, timely A-P transfer, and full oral clearing b/t trials, however, min delay in A-P transfer noted w/ purees inconsistently(but medications were crushed and mixed in). Dtr stated yesterday at eval Brianna Coleman has "pocketed" the foods at home recently. Brianna Coleman exhibited min impulsivivity when drinking liquids requiring monitoring and verbal cues to "take a break" - this also aided breath support for task. Son educated on how to help assist his Coleman when drinking liquids Via  Cup and how to monitor Brianna needs. Brianna Coleman helped to Hold Cup when drinking but needed full support overall; Brianna Coleman does have some Cognitive decline at baseline as endorsed by Brianna Coleman, and chart review. Discussed general aspiration precautions and swallowing support strategies as used during this session; Son practiced helping Brianna Coleman drink sips of liquid via Cup. Precautions given to Son as posted in room. Discussed Brianna Coleman's baseline comorbidities which can increase risk for dysphagia during this time of Acute illness and discussed need for modified food consistency. Son agreed.  Brianna Coleman appears declined from Brianna baseline when seen in Dec. 2018. Recommend continue a dysphagia level 1 diet w/ Thin liquids; strict aspiration precautions; Pills in puree Crushed as able. Feeding support at all meals.  Also, ANY Esophageal phase dysmotility (Brianna Coleman has a Large Hiatal Hernia per NSG report) can increased risk for Regurgitation which can then increase risk for aspiration of Reflux material thus impacting the Pulmonary status. Son was educated on general Reflux precautions including remaining upright post any oral intake and Small quantities of oral intake at one time w/ breaks to allow for motility. Handouts given to Son including information on the Rije dysphagia drink cup.    HPI HPI: Brianna Coleman is a 77 y.o. female who presents with admission post cardiac arrest. Brianna Coleman has a PMH of Multiple medical issues including Parkinson's Dis., GERD, stroke, and dysphagia per Dtr.  Brianna Coleman has been having short episodes of diaphoresis and epigastric discomfort for the past 3 days or so.  These have been short-lived and self resolving.  Today however, she had the same symptoms that lasted significantly longer and were not resolving on their own.  She was brought to the ED for evaluation and when she arrived here  underwent cardiac arrest.  She was resuscitated with 1 round of epinephrine and CPR.  She was able to become alert again, did not require intubation, but  was placed on BiPAP. Brianna Coleman endorsed Cognitive decline baseline at home - noted H&P listed "memory disturbance".       SLP Plan  Continue with current plan of care       Recommendations  Diet recommendations: Dysphagia 1 (puree);Thin liquid(w/ nectar consistency drinks) Liquids provided via: Cup;No straw Medication Administration: Crushed with puree(for safer, easier swallowing) Supervision: Staff to assist with self feeding;Full supervision/cueing for compensatory strategies Compensations: Minimize environmental distractions;Slow rate;Small sips/bites;Lingual sweep for clearance of pocketing;Multiple dry swallows after each bite/sip;Follow solids with liquid Postural Changes and/or Swallow Maneuvers: Seated upright 90 degrees;Upright 30-60 min after meal                General recommendations: (dietician f/u) Oral Care Recommendations: Oral care BID;Staff/trained caregiver to provide oral care Follow up Recommendations: (TBD) SLP Visit Diagnosis: Dysphagia, oropharyngeal phase (R13.12);Dysphagia, pharyngoesophageal phase (R13.14)(d/t Hiatal Hernia) Plan: Continue with current plan of care       [place], MS, CCC-SLP Mallori Araque 03/01/2018, 3:00 PM

## 2018-03-01 NOTE — Progress Notes (Signed)
Pharmacy Antibiotic Note  Brianna Coleman is a 77 y.o. female admitted on 03/11/2018 s/p cardiac arrest. Pharmacy has been consulted for Unasyn dosing for possible aspiration pneumonia.   Plan: Continue Unasyn 3 g IV q24h. Will treat for total of 7 days of therapy  Height: 5\' 3"  (160 cm) Weight: 123 lb 7.3 oz (56 kg) IBW/kg (Calculated) : 52.4  Temp (24hrs), Avg:99.5 F (37.5 C), Min:98.8 F (37.1 C), Max:100.2 F (37.9 C)  Recent Labs  Lab 03/06/2018 1841 02/21/2018 2314 03/01/18 0532  WBC 9.7  --  10.7  CREATININE 3.57* 3.47* 4.13*    Estimated Creatinine Clearance: 9.3 mL/min (A) (by C-G formula based on SCr of 4.13 mg/dL (H)).    No Known Allergies  Antimicrobials this admission: Cefepime 9/8 x 1  Zosyn 9/9 X 1 Unasyn 9/9 >> 9/14  Dose adjustments this admission: N/A  Microbiology results: 9/8 BCx: no growth x 2 days 9/8 MRSA PCR: negative   Thank you for allowing pharmacy to be a part of this patient's care.  Tawnya Crook, PharmD Pharmacy Resident  03/01/2018 3:03 PM

## 2018-03-01 NOTE — Progress Notes (Signed)
[facility] [place] NAME: Brianna Coleman    MR#:  [id number]  DATE OF BIRTH:  May 05, 1941  SUBJECTIVE:  CHIEF COMPLAINT:   Chief Complaint  Patient presents with  . Cardiac Arrest  BP improving, very weak and lethargic. Son at bedside REVIEW OF SYSTEMS:  Review of Systems  Constitutional: Negative for chills, fever and weight loss.  HENT: Negative for nosebleeds and sore throat.   Eyes: Negative for blurred vision.  Respiratory: Negative for cough, shortness of breath and wheezing.   Cardiovascular: Negative for chest pain, orthopnea, leg swelling and PND.  Gastrointestinal: Negative for abdominal pain, constipation, diarrhea, heartburn, nausea and vomiting.  Genitourinary: Negative for dysuria and urgency.  Musculoskeletal: Negative for back pain.  Skin: Negative for rash.  Neurological: Negative for dizziness, speech change, focal weakness and headaches.  Endo/Heme/Allergies: Does not bruise/bleed easily.  Psychiatric/Behavioral: Negative for depression.   DRUG ALLERGIES:  No Known Allergies VITALS:  Blood pressure 116/68, pulse 100, temperature 99.3 F (37.4 C), temperature source Bladder, resp. rate 20, height 5\' 3"  (1.6 m), weight 56 kg, SpO2 99 %. PHYSICAL EXAMINATION:  Physical Exam  Constitutional: She is oriented to person, place, and time.  HENT:  Head: Normocephalic and atraumatic.  Eyes: Pupils are equal, round, and reactive to light. Conjunctivae and EOM are normal.  Neck: Normal range of motion. Neck supple. No tracheal deviation present. No thyromegaly present.  Cardiovascular: Normal rate, regular rhythm and normal heart sounds.  Pulmonary/Chest: Effort normal and breath sounds normal. No respiratory distress. She has no wheezes. She exhibits no tenderness.  Abdominal: Soft. Bowel sounds are normal. She exhibits no distension. There is no tenderness.  Musculoskeletal: Normal range of motion.  Neurological: She is  alert and oriented to person, place, and time. No cranial nerve deficit.  Skin: Skin is warm and dry. No rash noted.   LABORATORY PANEL:  Female CBC Recent Labs  Lab 03/01/18 0532  WBC 10.7  HGB 8.7*  HCT 25.9*  PLT 100*   ------------------------------------------------------------------------------------------------------------------ Chemistries  Recent Labs  Lab 03/10/2018 2314 03/01/18 0532  NA 142 144  K 3.6 3.7  CL 110 112*  CO2 21* 23  GLUCOSE 202* 126*  BUN 52* 70*  CREATININE 3.47* 4.13*  CALCIUM 8.2* 8.1*  MG 2.0  --   AST 82*  --   ALT 10  --   ALKPHOS 76  --   BILITOT 0.6  --    RADIOLOGY:  No results found. ASSESSMENT AND PLAN:   * Cardiac arrest ([name])  - Echo per cardio  - trend troponins peak at 4.4->2.57  * Acute renal failure superimposed on chronic kidney disease 4 - patient has been following with nephrology for her chronic kidney disease, she has slightly worsened function tonight, nephrology considering dialysis  * Hypotension with h/o Essential (primary) hypertension - monitor in ICU * Parkinson's disease ([place]) - Home dose Sinemet * HLD (hyperlipidemia) - Home dose antilipid   Palliative care c/s -   She is not appropriate for any kind of Rehab - no STR/SNF - d/w son who is in agreement. They would like to take her home with home health [name])  All the records are reviewed and case discussed with Care Management/Social Worker. Management plans discussed with the patient, family and they are in agreement.  CODE STATUS: Full Code  TOTAL TIME TAKING CARE OF THIS PATIENT: 35 minutes.   More than 50% of the time was spent  in counseling/coordination of care: YES  POSSIBLE D/C IN 2-3 DAYS, DEPENDING ON CLINICAL CONDITION.   Max Sane M.D on 03/01/2018 at 3:55 PM  Between 7am to 6pm - Pager - [phone]  After 6pm go to [email or web address] - Proofreader  [facility]  Office  [phone]  CC: Primary  care physician; [place] Pink, MD  Note: This dictation was prepared with Dragon dictation along with smaller phrase technology. Any transcriptional errors that result from this process are unintentional.

## 2018-03-01 NOTE — Progress Notes (Signed)
Patient more alert around 16:30 and able to take pills crushed in applesauce with lots of encouragement from RN and son.

## 2018-03-02 ENCOUNTER — Encounter: Payer: Self-pay | Admitting: *Deleted

## 2018-03-02 ENCOUNTER — Encounter: Admission: EM | Disposition: E | Payer: Self-pay | Source: Home / Self Care | Attending: Internal Medicine

## 2018-03-02 DIAGNOSIS — N186 End stage renal disease: Secondary | ICD-10-CM

## 2018-03-02 HISTORY — PX: DIALYSIS/PERMA CATHETER INSERTION: CATH118288

## 2018-03-02 LAB — GLUCOSE, CAPILLARY: Glucose-Capillary: 101 mg/dL — ABNORMAL HIGH (ref 70–99)

## 2018-03-02 LAB — RENAL FUNCTION PANEL
ANION GAP: 9 (ref 5–15)
Albumin: 3 g/dL — ABNORMAL LOW (ref 3.5–5.0)
BUN: 88 mg/dL — ABNORMAL HIGH (ref 8–23)
CO2: 25 mmol/L (ref 22–32)
Calcium: 8.4 mg/dL — ABNORMAL LOW (ref 8.9–10.3)
Chloride: 113 mmol/L — ABNORMAL HIGH (ref 98–111)
Creatinine, Ser: 4.54 mg/dL — ABNORMAL HIGH (ref 0.44–1.00)
GFR calc Af Amer: 10 mL/min — ABNORMAL LOW (ref >60)
GFR calc non Af Amer: 8 mL/min — ABNORMAL LOW (ref >60)
GLUCOSE: 120 mg/dL — AB (ref 70–99)
PHOSPHORUS: 5.1 mg/dL — AB (ref 2.5–4.6)
POTASSIUM: 3.7 mmol/L (ref 3.5–5.1)
Sodium: 147 mmol/L — ABNORMAL HIGH (ref 135–145)

## 2018-03-02 SURGERY — DIALYSIS/PERMA CATHETER INSERTION
Anesthesia: Moderate Sedation

## 2018-03-02 MED ORDER — TUBERCULIN PPD 5 UNIT/0.1ML ID SOLN
5.0000 [IU] | Freq: Once | INTRADERMAL | Status: AC
  Administered 2018-03-02: 5 [IU] via INTRADERMAL
  Filled 2018-03-02: qty 0.1

## 2018-03-02 MED ORDER — HEPARIN SODIUM (PORCINE) 10000 UNIT/ML IJ SOLN
INTRAMUSCULAR | Status: AC
  Filled 2018-03-02: qty 1

## 2018-03-02 MED ORDER — FENTANYL CITRATE (PF) 100 MCG/2ML IJ SOLN
INTRAMUSCULAR | Status: AC
  Filled 2018-03-02: qty 2

## 2018-03-02 MED ORDER — CEFAZOLIN SODIUM-DEXTROSE 1-4 GM/50ML-% IV SOLN
1.0000 g | Freq: Once | INTRAVENOUS | Status: AC
  Administered 2018-03-02: 1 g via INTRAVENOUS

## 2018-03-02 MED ORDER — STERILE WATER FOR INJECTION IJ SOLN
INTRAMUSCULAR | Status: AC
  Administered 2018-03-02: 10 mL
  Filled 2018-03-02: qty 10

## 2018-03-02 MED ORDER — HEPARIN (PORCINE) IN NACL 1000-0.9 UT/500ML-% IV SOLN
INTRAVENOUS | Status: AC
  Filled 2018-03-02: qty 500

## 2018-03-02 MED ORDER — CEFAZOLIN SODIUM-DEXTROSE 1-4 GM/50ML-% IV SOLN
1.0000 g | INTRAVENOUS | Status: DC
  Filled 2018-03-02: qty 50

## 2018-03-02 MED ORDER — MIDAZOLAM HCL 2 MG/2ML IJ SOLN
INTRAMUSCULAR | Status: AC
  Filled 2018-03-02: qty 2

## 2018-03-02 MED ORDER — CHLORHEXIDINE GLUCONATE CLOTH 2 % EX PADS
6.0000 | MEDICATED_PAD | Freq: Every day | CUTANEOUS | Status: DC
  Administered 2018-03-03 – 2018-03-08 (×2): 6 via TOPICAL

## 2018-03-02 MED ORDER — LIDOCAINE-EPINEPHRINE (PF) 1 %-1:200000 IJ SOLN
INTRAMUSCULAR | Status: AC
  Filled 2018-03-02: qty 30

## 2018-03-02 MED ORDER — IPRATROPIUM-ALBUTEROL 0.5-2.5 (3) MG/3ML IN SOLN
3.0000 mL | RESPIRATORY_TRACT | Status: DC | PRN
  Administered 2018-03-06 – 2018-03-08 (×3): 3 mL via RESPIRATORY_TRACT
  Filled 2018-03-02 (×4): qty 3

## 2018-03-02 SURGICAL SUPPLY — 10 items
CATH PALINDROME RT-P 15FX19CM (CATHETERS) ×3 IMPLANT
DERMABOND ADVANCED (GAUZE/BANDAGES/DRESSINGS) ×2
DERMABOND ADVANCED .7 DNX12 (GAUZE/BANDAGES/DRESSINGS) ×1 IMPLANT
PACK ANGIOGRAPHY (CUSTOM PROCEDURE TRAY) ×3 IMPLANT
SHEATH BRITE TIP 5FRX11 (SHEATH) IMPLANT
SUT MNCRL 4-0 (SUTURE) ×2
SUT MNCRL 4-0 27XMFL (SUTURE) ×1
SUT SILK 0 FSL (SUTURE) ×3 IMPLANT
SUT VICRYL+ 3-0 36IN CT-1 (SUTURE) ×3 IMPLANT
SUTURE MNCRL 4-0 27XMF (SUTURE) ×1 IMPLANT

## 2018-03-02 NOTE — Progress Notes (Signed)
[facility]  ROUNDING NOTE   Subjective:  Urine output remains low. Urine output was only 460 cc over the preceding 24 hours. Repeat renal function testing pending this a.m. We have been considering initiation of renal replacement therapy.   Objective:  Vital signs in last 24 hours:  Temp:  [98.1 F (36.7 C)-99.9 F (37.7 C)] 98.6 F (37 C) (09/11 0800) Pulse Rate:  [84-119] 98 (09/11 0800) Resp:  [11-48] 20 (09/11 0800) BP: (81-159)/(43-120) 113/49 (09/11 0800) SpO2:  [93 %-100 %] 99 % (09/11 0800) Weight:  [56.2 kg] 56.2 kg (09/11 0349)  Weight change: 0.2 kg Filed Weights   02/24/2018 2244 03/01/18 0426 03/09/2018 0349  Weight: 55.3 kg 56 kg 56.2 kg    Intake/Output: I/O last 3 completed shifts: In: 759.7 [P.O.:60; I.V.:359.7; NG/GT:140; IV Piggyback:200] Out: 860 [Urine:860]   Intake/Output this shift:  No intake/output data recorded.  Physical Exam: General: No acute distress  Head: Normocephalic, atraumatic. Moist oral mucosal membranes  Eyes: Anicteric  Neck: Supple, trachea midline  Lungs:  Clear to auscultation, normal effort  Heart: S1S2 no rubs  Abdomen:  Soft, nontender, bowel sounds present  Extremities: 1+ peripheral edema.  Neurologic: Awake, slow to respond to questions  Skin: No lesions       Basic Metabolic Panel: Recent Labs  Lab 03/04/2018 1841 02/28/2018 2314 03/01/18 0532  NA 139 142 144  K 5.0 3.6 3.7  CL 108 110 112*  CO2 14* 21* 23  GLUCOSE 340* 202* 126*  BUN 45* 52* 70*  CREATININE 3.57* 3.47* 4.13*  CALCIUM 8.9 8.2* 8.1*  MG  --  2.0  --   PHOS  --  4.1  --     Liver Function Tests: Recent Labs  Lab 03/10/2018 1841 02/21/2018 2314  AST 31 82*  ALT 5 10  ALKPHOS 81 76  BILITOT 0.7 0.6  PROT 6.3* 6.2*  ALBUMIN 3.7 3.4*   Recent Labs  Lab 03/09/2018 1841  LIPASE 85*   No results for input(s): AMMONIA in the last 168 hours.  CBC: Recent Labs  Lab 03/21/2018 1841 03/01/18 0532  WBC 9.7 10.7  NEUTROABS  4.9 9.7*  HGB 10.6* 8.7*  HCT 33.3* 25.9*  MCV 100.5* 95.9  PLT 134* 100*    Cardiac Enzymes: Recent Labs  Lab 02/21/2018 2043 03/08/2018 2314 02/28/18 0446 02/28/18 1057 03/01/18 0532  TROPONINI 0.07* 0.76* 3.57* 4.43* 2.57*    BNP: Invalid input(s): POCBNP  CBG: Recent Labs  Lab 02/28/2018 2232 03/01/18 1509 02/23/2018 0738  GLUCAP 207* 122* 101*    Microbiology: Results for orders placed or performed during the hospital encounter of 03/13/2018  Blood culture (routine x 2)     Status: None (Preliminary result)   Collection Time: 02/24/2018  7:54 PM  Result Value Ref Range Status   Specimen Description BLOOD RIGHT WRIST  Final   Special Requests   Final    BOTTLES DRAWN AEROBIC AND ANAEROBIC Blood Culture results may not be optimal due to an excessive volume of blood received in culture bottles   Culture   Final    NO GROWTH 3 DAYS Performed at [facility], [address] [place], [place] [address]    Report Status PENDING  Incomplete  MRSA PCR Screening     Status: None   Collection Time: 03/16/2018 10:39 PM  Result Value Ref Range Status   MRSA by PCR NEGATIVE NEGATIVE Final    Comment:        The  GeneXpert MRSA Assay (FDA approved for NASAL specimens only), is one component of a comprehensive MRSA colonization surveillance program. It is not intended to diagnose MRSA infection nor to guide or monitor treatment for MRSA infections. Performed at [facility], [address] [place], [place] [address]   Blood culture (routine x 2)     Status: None (Preliminary result)   Collection Time: 02/25/2018 11:12 PM  Result Value Ref Range Status   Specimen Description BLOOD LEFT ARM  Final   Special Requests   Final    BOTTLES DRAWN AEROBIC AND ANAEROBIC Blood Culture results may not be optimal due to an inadequate volume of blood received in culture bottles   Culture   Final    NO GROWTH 3 DAYS Performed at [facility], [address] [place], [place] [address]    Report Status PENDING  Incomplete    Coagulation Studies: Recent Labs    03/21/2018 Aug 28, 2332  LABPROT 14.2  INR 1.11    Urinalysis: Recent Labs    02/23/2018 28-Aug-2049  COLORURINE YELLOW*  LABSPEC 1.013  PHURINE 6.0  GLUCOSEU 150*  HGBUR SMALL*  BILIRUBINUR NEGATIVE  KETONESUR NEGATIVE  PROTEINUR 100*  NITRITE NEGATIVE  LEUKOCYTESUR NEGATIVE      Imaging: No results found.   Medications:   . sodium chloride Stopped (03/01/18 0031)  . ampicillin-sulbactam (UNASYN) IV Stopped (03/01/18 1811)   . aspirin  81 mg Oral Daily  . atorvastatin  80 mg Oral QPM  . carbidopa-levodopa  1 tablet Oral QID  . chlorhexidine  15 mL Mouth Rinse BID  . clopidogrel  75 mg Oral Daily  . feeding supplement (NEPRO CARB STEADY)  237 mL Oral BID BM  . heparin injection (subcutaneous)  5,000 Units Subcutaneous Q8H  . ipratropium-albuterol  3 mL Nebulization Q4H  . mouth rinse  15 mL Mouth Rinse q12n4p  . Melatonin  2.5 mg Oral QHS  . pantoprazole (PROTONIX) IV  40 mg Intravenous Daily  . pramipexole  1 mg Oral TID   sodium chloride, acetaminophen, ketorolac  Assessment/ Plan:  77 y.o. female with past medical history of hypertension, Parkinson's disorder, GI bleed, overactive bladder, iron deficiency anemia, hyperlipidemia, chronic musculoskeletal low back pain, chronic lower extremity edema, GERD, right basal ganglia CVA and vitamin D deficiency admitted with cardiac arrest.   1.  Acute renal failure/chronic kidney disease stage IV baseline EGFR 16.  Suspect that her acute renal failure now attributable to recent cardiac arrest.   -Renal function remains low at the moment.  Creatinine currently 4.13 however this was from yesterday.  Awaiting new renal function testing.  If renal function remains low we will consider starting the patient on renal placement therapy.  2.  Anemia of chronic kidney disease.  Hemoglobin yesterday was 8.7.  Continue to monitor periodically.   Hold off on Epogen at this time.  3.  Secondary hyperparathyroidism.  Serum phosphorus was 4.1 at last check.  Hold off on binders at the moment.  Continue to periodically monitor serum phosphorus.   LOS: 3 Brianna Coleman 9/11/20198:49 AM

## 2018-03-02 NOTE — Progress Notes (Signed)
[place] [facility] [place] NAME: Brianna Coleman    MR#:  [id number]  DATE OF BIRTH:  Jun 07, 1941  SUBJECTIVE:  CHIEF COMPLAINT:   Chief Complaint  Patient presents with  . Cardiac Arrest  Denies any chest pain or shortness of breath BP, very weak but alert Weaned off BiPAP REVIEW OF SYSTEMS:  Review of Systems  Constitutional: Negative for chills, fever and weight loss.  HENT: Negative for nosebleeds and sore throat.   Eyes: Negative for blurred vision.  Respiratory: Negative for cough, shortness of breath and wheezing.   Cardiovascular: Negative for chest pain, orthopnea, leg swelling and PND.  Gastrointestinal: Negative for abdominal pain, constipation, diarrhea, heartburn, nausea and vomiting.  Genitourinary: Negative for dysuria and urgency.  Musculoskeletal: Negative for back pain.  Skin: Negative for rash.  Neurological: Negative for dizziness, speech change, focal weakness and headaches.  Endo/Heme/Allergies: Does not bruise/bleed easily.  Psychiatric/Behavioral: Negative for depression.   DRUG ALLERGIES:  No Known Allergies VITALS:  Blood pressure (!) 101/53, pulse (!) 101, temperature 98.3 F (36.8 C), temperature source Oral, resp. rate (!) 21, height 5\' 3"  (1.6 m), weight 56.2 kg, SpO2 96 %. PHYSICAL EXAMINATION:  Physical Exam  Constitutional: She is oriented to person, place, and time.  HENT:  Head: Normocephalic and atraumatic.  Eyes: Pupils are equal, round, and reactive to light. Conjunctivae and EOM are normal.  Neck: Normal range of motion. Neck supple. No tracheal deviation present. No thyromegaly present.  Cardiovascular: Normal rate, regular rhythm and normal heart sounds.  Pulmonary/Chest: Effort normal and breath sounds normal. No respiratory distress. She has no wheezes. She exhibits no tenderness.  Abdominal: Soft. Bowel sounds are normal. She exhibits no distension. There is no tenderness.  Musculoskeletal:  Normal range of motion.  Neurological: She is alert and oriented to person, place, and time. No cranial nerve deficit.  Skin: Skin is warm and dry. No rash noted.   LABORATORY PANEL:  Female CBC Recent Labs  Lab 03/01/18 0532  WBC 10.7  HGB 8.7*  HCT 25.9*  PLT 100*   ------------------------------------------------------------------------------------------------------------------ Chemistries  Recent Labs  Lab 03/04/2018 2314  03/20/2018 0829  NA 142   < > 147*  K 3.6   < > 3.7  CL 110   < > 113*  CO2 21*   < > 25  GLUCOSE 202*   < > 120*  BUN 52*   < > 88*  CREATININE 3.47*   < > 4.54*  CALCIUM 8.2*   < > 8.4*  MG 2.0  --   --   AST 82*  --   --   ALT 10  --   --   ALKPHOS 76  --   --   BILITOT 0.6  --   --    < > = values in this interval not displayed.   RADIOLOGY:  No results found. ASSESSMENT AND PLAN:  *Acute respiratory failure Off BiPAP Follow-up with pulmonology   * Cardiac arrest ([place])  - Echo per cardio  - trend troponins peak at 4.4->2.57 -Cardiology is recommending conservative management given the comorbidities  * Acute renal failure superimposed on chronic kidney disease 4 - patient has been following with nephrology for her chronic kidney disease Patient was getting peritoneal dialysis before but now nephrology is considering hemodialysis and awaiting permacath placement   * Hypotension with h/o Essential (primary) hypertension - monitor in ICU Blood pressure is much better today  * Parkinson's  disease ([place]) - Home dose Sinemet  * HLD (hyperlipidemia) - Home dose antilipid   Palliative care c/s -   She is not appropriate for any kind of Rehab - no STR/SNF - d/w son who is in agreement. They would like to take her home with home health [name])  All the records are reviewed and case discussed with Care Management/Social Worker. Management plans discussed with the patient, family and they are in agreement.  CODE STATUS: Full Code  TOTAL  TIME TAKING CARE OF THIS PATIENT: 35 minutes.   More than 50% of the time was spent in counseling/coordination of care: YES  POSSIBLE D/C IN 2-3 DAYS, DEPENDING ON CLINICAL CONDITION.   Nicholes Mango M.D on 03/07/2018 at 3:46 PM  Between 7am to 6pm - Pager - [phone]  After 6pm go to [email or web address] - Proofreader  [employer] [facility]  [facility]ospitalists  Office  [phone]  CC: Primary care physician; [place] Pink, MD  Note: This dictation was prepared with Dragon dictation along with smaller phrase technology. Any transcriptional errors that result from this process are unintentional.

## 2018-03-02 NOTE — Progress Notes (Signed)
Follow up - Critical Care Medicine Note  Patient Details:    Brianna Coleman is an 77 y.o. female. Severe Parkinson's, orthostatic hypertension, REM behavior disorder, hyperlipidemia, aortic stenosis, hypertension, CVA,status post episode of epigastric pain, brief cardiac arrest and respiratory distress. Has improved mentation and weaned off of BiPAP  Lines, Airways, Drains: Urethral Catheter Megan RN Non-latex 14 Fr. (Active)  Indication for Insertion or Continuance of Catheter Unstable critical patients (first 24-48 hours) 03/01/2018  4:00 AM  Site Assessment Clean;Intact 03/01/2018  4:00 AM  Catheter Maintenance Bag below level of bladder;Catheter secured;Drainage bag/tubing not touching floor;No dependent loops;Insertion date on drainage bag;Seal intact 03/01/2018  4:00 AM  Collection Container Standard drainage bag 03/01/2018  4:00 AM  Output (mL) 100 mL 03/01/2018  4:00 AM    Anti-infectives:  Anti-infectives (From admission, onward)   Start     Dose/Rate Route Frequency Ordered Stop   02/28/18 1800  Ampicillin-Sulbactam (UNASYN) 3 g in sodium chloride 0.9 % 100 mL IVPB     3 g 200 mL/hr over 30 Minutes Intravenous Every 24 hours 02/28/18 1140     02/28/18 0200  piperacillin-tazobactam (ZOSYN) IVPB 3.375 g  Status:  Discontinued     3.375 g 12.5 mL/hr over 240 Minutes Intravenous Every 12 hours 03/03/2018 2349 02/28/18 1008   02/23/2018 1930  ceFEPIme (MAXIPIME) 1 g in sodium chloride 0.9 % 100 mL IVPB     1 g 200 mL/hr over 30 Minutes Intravenous  Once 03/17/2018 1926 02/20/2018 2001      Microbiology: Results for orders placed or performed during the hospital encounter of 03/01/2018  Blood culture (routine x 2)     Status: None (Preliminary result)   Collection Time: 03/11/2018  7:54 PM  Result Value Ref Range Status   Specimen Description BLOOD RIGHT WRIST  Final   Special Requests   Final    BOTTLES DRAWN AEROBIC AND ANAEROBIC Blood Culture results may not be optimal due to an  excessive volume of blood received in culture bottles   Culture   Final    NO GROWTH 3 DAYS Performed at [facility], [address], [place], [place] [address]    Report Status PENDING  Incomplete  MRSA PCR Screening     Status: None   Collection Time: 02/21/2018 10:39 PM  Result Value Ref Range Status   MRSA by PCR NEGATIVE NEGATIVE Final    Comment:        The GeneXpert MRSA Assay (FDA approved for NASAL specimens only), is one component of a comprehensive MRSA colonization surveillance program. It is not intended to diagnose MRSA infection nor to guide or monitor treatment for MRSA infections. Performed at [facility], [address] [place], [place] [address]   Blood culture (routine x 2)     Status: None (Preliminary result)   Collection Time: 03/08/2018 11:12 PM  Result Value Ref Range Status   Specimen Description BLOOD LEFT ARM  Final   Special Requests   Final    BOTTLES DRAWN AEROBIC AND ANAEROBIC Blood Culture results may not be optimal due to an inadequate volume of blood received in culture bottles   Culture   Final    NO GROWTH 3 DAYS Performed at [facility], [address], [place], [place] [address]    Report Status PENDING  Incomplete    Studies: Dg Abd 1 View  Result Date: 02/28/2018 CLINICAL DATA:  Abdominal pain EXAM: ABDOMEN - 1 VIEW COMPARISON:  None. FINDINGS:  Gaseous distention of the stomach and small bowel. No dilated small bowel is visualized. Incompletely visualized cardiomegaly. IMPRESSION: Gas distended stomach. No radiographic evidence of small-bowel obstruction. Electronically Signed   By: Ulyses Jarred M.D.   On: 02/28/2018 00:11   Ct Head Wo Contrast  Result Date: 03/11/2018 CLINICAL DATA:  Altered level of consciousness, unexplained. EXAM: CT HEAD WITHOUT CONTRAST TECHNIQUE: Contiguous axial images were obtained from the base of the skull through the vertex without intravenous contrast. COMPARISON:  Head CT  11/30/2017 FINDINGS: Brain: No intracranial hemorrhage, mass effect, or midline shift. Unchanged atrophy and chronic small vessel ischemia. Unchanged remote lacunar infarcts in the right greater than left basal ganglia. Small remote left parietal infarct again seen. No cerebral edema. No hydrocephalus. The basilar cisterns are patent. No evidence of territorial infarct or acute ischemia. No extra-axial or intracranial fluid collection. Vascular: Atherosclerosis of skullbase vasculature without hyperdense vessel or abnormal calcification. Skull: No fracture or focal lesion. Sinuses/Orbits: Paranasal sinuses and mastoid air cells are clear. The visualized orbits are unremarkable. Other: None. IMPRESSION: 1.  No acute intracranial abnormality. 2. Unchanged atrophy, chronic small vessel ischemia, and remote lacunar infarcts in the basal ganglia. Electronically Signed   By: Keith Rake M.D.   On: 03/16/2018 21:08   Dg Chest Port 1 View  Result Date: 02/23/2018 CLINICAL DATA:  Cardiac arrest. EXAM: PORTABLE CHEST 1 VIEW COMPARISON:  Radiograph earlier this day. FINDINGS: Cardiac enlargement partially obscured by large retrocardiac hiatal hernia. Slight decreased gaseous gastric distension from prior. Aortic atherosclerosis. Mild vascular congestion without overt pulmonary edema. Probable bibasilar atelectasis with possible small pleural effusions. Improved linear right lung base atelectasis from prior. No pneumothorax. IMPRESSION: 1. Probable bibasilar atelectasis and possible pleural effusions. Vascular congestion. 2. Cardiac enlargement, partially obscured by large hiatal hernia. Slight decreased gaseous gastric distension from prior. Electronically Signed   By: Keith Rake M.D.   On: 03/15/2018 23:22   Dg Chest Portable 1 View  Result Date: 03/14/2018 CLINICAL DATA:  Respiratory distress. EXAM: PORTABLE CHEST 1 VIEW COMPARISON:  12/18/2016. FINDINGS: Stable enlarged cardiac silhouette. Interval gaseous  distention of the stomach, including a large hiatal hernia. Interval linear atelectasis at the right lung base. Clear left lung. Diffuse osteopenia. IMPRESSION: 1. Interval gaseous distention of the stomach, including a large hiatal hernia. 2. Interval linear atelectasis at the right lung base. Electronically Signed   By: Claudie Revering M.D.   On: 02/21/2018 19:18   Dg Abd Portable 1v  Result Date: 02/28/2018 CLINICAL DATA:  Nasogastric tube placement EXAM: PORTABLE ABDOMEN - 1 VIEW COMPARISON:  Portable exam 0138 hours compared to 03/06/2018 and correlated with prior CT abdomen and pelvis exam of 12/18/2016 FINDINGS: Nasogastric tube coiled in a large hiatal hernia at the lower LEFT hemithorax. Gaseous distention of the distal stomach below the diaphragm. Air-filled loops of nondistended bowel throughout abdomen and pelvis. Bones demineralized. External pacing leads noted. IMPRESSION: Tip of nasogastric tube is coiled within a large hiatal hernia at the inferior LEFT chest. Electronically Signed   By: Lavonia Dana M.D.   On: 02/28/2018 02:00    Consults: Treatment Team:  Hermelinda Dellen, DO Corey Skains, MD Anthonette Legato, MD   Subjective:    Overnight Issues:patient wore BiPAP last night secondary to increased work of breathing, presently on nasal cannula, is awake  Objective:  Vital signs for last 24 hours: Temp:  [98.1 F (36.7 C)-99.9 F (37.7 C)] 98.6 F (37 C) (09/11 0800) Pulse Rate:  [84-119] 98 (  09/11 0800) Resp:  [11-48] 20 (09/11 0800) BP: (81-159)/(43-120) 113/49 (09/11 0800) SpO2:  [93 %-100 %] 99 % (09/11 0800) Weight:  [56.2 kg] 56.2 kg (09/11 0349)  Hemodynamic parameters for last 24 hours:    Intake/Output from previous day: 09/10 0701 - 09/11 0700 In: 300 [P.O.:60; NG/GT:140; IV Piggyback:100] Out: 460 [Urine:460]  Intake/Output this shift: No intake/output data recorded.  Vent settings for last 24 hours:    Physical Exam:  Vital signs: Please see the  above listed vital signs HEENT: Trachea midline, no oral lesions appreciated, no jugular venous distention, on nasal cannula at 3 L Cardiovascular: Regular rate and rhythm Pulmonary: Clear to auscultation Abdominal: Positive bowel sounds, soft exam Extremities: No clubbing cyanosis or edema noted Neurologic: Moves all extremities, is awake and interactive today did have some confusion last night  Assessment/Plan:   Respiratory failure. Patient wore BiPAP last evening secondary to increased work of breathing. On supplemental oxygen via nasal cannula this morning, is empirically being treated with Unasyn, albuterol, Atrovent, not actively wheezing at this time  Severe Parkinson's with dementia. Placed back on her Sinemet  Orthostatic hypotension. Most likely in association with her Parkinson's  Positive cardiac enzymes. Peak troponin was 4.43, has decreased now to 2.57. Has been seen by cardiology and felt secondary to all of her comorbidities including chronic renal failure conservative management would be best  Chronic renal failure.She is followed by Dr. Holley Raring, in the outpatient setting consideration for peritoneal dialysis. Per nephrology patient may be started on hemodialysis and [facility] Cath placement.  Anemia. No evidence of active bleeding  Questionable seizure activity. 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 During these events she loses muscle tone, does not lose consciousness however and can recall some of these events. The facial asymmetry may be muscle atonia, jaw slacking, during the cataplexy episode. There are REM suppressant medications however initial approach should include regular sleep-wake schedule, nightly melatonin to help stabilized circadian rhythm, careful attention to the  sleeping room to help avoid trauma She should follow-up in the outpatient setting with sleep physician for confirmation.  Korry Dalgleish 03/15/2018  *Care during the described time interval was provided by me and/or other providers on the critical care team.  I have reviewed this patient's available data, including medical history, events of note, physical examination and test results as part of my evaluation. Patient ID: Brianna Coleman, female   DOB: Jul 31, 1940, 77 y.o.   MRN: [id number]

## 2018-03-02 NOTE — Progress Notes (Signed)
Pharmacy Antibiotic Note  Brianna Coleman is a 77 y.o. female admitted on 03/07/2018 s/p cardiac arrest. Pharmacy has been consulted for Unasyn dosing for possible aspiration pneumonia.   Plan: Continue Unasyn 3 g IV q24h. Will treat for total of 7 days of therapy  Height: 5\' 3"  (160 cm) Weight: 123 lb 14.4 oz (56.2 kg) IBW/kg (Calculated) : 52.4  Temp (24hrs), Avg:98.9 F (37.2 C), Min:98.1 F (36.7 C), Max:99.9 F (37.7 C)  Recent Labs  Lab 03/05/2018 1841 02/25/2018 2314 03/01/18 0532 02/24/2018 0829  WBC 9.7  --  10.7  --   CREATININE 3.57* 3.47* 4.13* 4.54*    Estimated Creatinine Clearance: 8.4 mL/min (A) (by C-G formula based on SCr of 4.54 mg/dL (H)).    No Known Allergies  Antimicrobials this admission: Cefepime 9/8 x 1  Zosyn 9/9 X 1 Unasyn 9/9 >> 9/14  Dose adjustments this admission: N/A  Microbiology results: 9/8 BCx: no growth x 3 days 9/8 MRSA PCR: negative   Thank you for allowing pharmacy to be a part of this patient's care.  Tawnya Crook, PharmD Pharmacy Resident  03/11/2018 1:26 PM

## 2018-03-02 NOTE — Progress Notes (Signed)
Patient resting well- vitals stable.  Pt on 3 liters 02 all day tolerating well- then placed patient on bipap around 16:45 due to increased work of breathing.  Perm cath placed today- per Dr. Holley Raring patient will start dialysis in the morning.

## 2018-03-02 NOTE — Progress Notes (Signed)
[facility] Encounter Note  Patient: Brianna Coleman / Admit Date: 03/20/2018 / Date of Encounter: 02/23/2018, 8:18 AM   Subjective: Patient is slightly improved since yesterday with some conversation with family and some more alertness.  No evidence of significant complaints at this time.  No evidence of congestive heart failure or anginal symptoms.  Troponin level elevated at 4.4 consistent with non-ST elevation myocardial infarction.  Discussed with family about concerns of myocardial infarction and appropriate approach noninvasive at this time due to concerns of multiple issues with the patient.  Her clinical presentation suggest some type of aspiration and/or respiratory failure causing non-ST elevation myocardial infarction.  Her discussion with family we will not incur any further invasive procedures  Review of Systems: Not assessed due to above Objective: Telemetry: This tachycardia Physical Exam: Blood pressure (!) 113/49, pulse 98, temperature 98.6 F (37 C), resp. rate 20, height 5\' 3"  (1.6 m), weight 56.2 kg, SpO2 99 %. Body mass index is 21.95 kg/m. General: Not well well developed, well nourished, in no acute distress. Head: Normocephalic, atraumatic, sclera non-icteric, no xanthomas, nares are without discharge. Neck: No apparent masses Lungs: Normal respirations with no wheezes, no rhonchi, no rales , no crackles   Heart: Regular rate and rhythm, normal S1 S2, no 2-3+ aortic murmur, no rub, no gallop, PMI is normal size and placement, carotid upstroke normal without bruit, jugular venous pressure normal Abdomen: Soft, non-tender, non-distended with normoactive bowel sounds. No hepatosplenomegaly. Abdominal aorta is normal size without bruit Extremities: No edema, no clubbing, no cyanosis, no ulcers,  Peripheral: 2+ radial, 2+ femoral, 2+ dorsal pedal pulses Neuro: Not alert and oriented. Moves all extremities spontaneously. Psych: Does not responds to  questions appropriately with a normal affect.   Intake/Output Summary (Last 24 hours) at 03/14/2018 0818 Last data filed at 02/24/2018 0600 Gross per 24 hour  Intake 300 ml  Output 460 ml  Net -160 ml    Inpatient Medications:  . aspirin  81 mg Oral Daily  . atorvastatin  80 mg Oral QPM  . carbidopa-levodopa  1 tablet Oral QID  . chlorhexidine  15 mL Mouth Rinse BID  . clopidogrel  75 mg Oral Daily  . feeding supplement (NEPRO CARB STEADY)  237 mL Oral BID BM  . heparin injection (subcutaneous)  5,000 Units Subcutaneous Q8H  . ipratropium-albuterol  3 mL Nebulization Q4H  . mouth rinse  15 mL Mouth Rinse q12n4p  . Melatonin  2.5 mg Oral QHS  . pantoprazole (PROTONIX) IV  40 mg Intravenous Daily  . pramipexole  1 mg Oral TID   Infusions:  . sodium chloride Stopped (03/01/18 0031)  . ampicillin-sulbactam (UNASYN) IV Stopped (03/01/18 1811)    Labs: Recent Labs    02/22/2018 2314 03/01/18 0532  NA 142 144  K 3.6 3.7  CL 110 112*  CO2 21* 23  GLUCOSE 202* 126*  BUN 52* 70*  CREATININE 3.47* 4.13*  CALCIUM 8.2* 8.1*  MG 2.0  --   PHOS 4.1  --    Recent Labs    02/24/2018 1841 03/10/2018 2314  AST 31 82*  ALT 5 10  ALKPHOS 81 76  BILITOT 0.7 0.6  PROT 6.3* 6.2*  ALBUMIN 3.7 3.4*   Recent Labs    03/14/2018 1841 03/01/18 0532  WBC 9.7 10.7  NEUTROABS 4.9 9.7*  HGB 10.6* 8.7*  HCT 33.3* 25.9*  MCV 100.5* 95.9  PLT 134* 100*   Recent Labs    03/11/2018 2314  02/28/18 0446 02/28/18 1057 03/01/18 0532  TROPONINI 0.76* 3.57* 4.43* 2.57*   Invalid input(s): POCBNP Recent Labs    02/24/2018 2313  HGBA1C 5.3     Weights: Filed Weights   02/26/2018 2244 03/01/18 0426 02/21/2018 0349  Weight: 55.3 kg 56 kg 56.2 kg     Radiology/Studies:  Dg Abd 1 View  Result Date: 02/28/2018 CLINICAL DATA:  Abdominal pain EXAM: ABDOMEN - 1 VIEW COMPARISON:  None. FINDINGS: Gaseous distention of the stomach and small bowel. No dilated small bowel is visualized. Incompletely  visualized cardiomegaly. IMPRESSION: Gas distended stomach. No radiographic evidence of small-bowel obstruction. Electronically Signed   By: Ulyses Jarred M.D.   On: 02/28/2018 00:11   Ct Head Wo Contrast  Result Date: 03/08/2018 CLINICAL DATA:  Altered level of consciousness, unexplained. EXAM: CT HEAD WITHOUT CONTRAST TECHNIQUE: Contiguous axial images were obtained from the base of the skull through the vertex without intravenous contrast. COMPARISON:  Head CT 11/30/2017 FINDINGS: Brain: No intracranial hemorrhage, mass effect, or midline shift. Unchanged atrophy and chronic small vessel ischemia. Unchanged remote lacunar infarcts in the right greater than left basal ganglia. Small remote left parietal infarct again seen. No cerebral edema. No hydrocephalus. The basilar cisterns are patent. No evidence of territorial infarct or acute ischemia. No extra-axial or intracranial fluid collection. Vascular: Atherosclerosis of skullbase vasculature without hyperdense vessel or abnormal calcification. Skull: No fracture or focal lesion. Sinuses/Orbits: Paranasal sinuses and mastoid air cells are clear. The visualized orbits are unremarkable. Other: None. IMPRESSION: 1.  No acute intracranial abnormality. 2. Unchanged atrophy, chronic small vessel ischemia, and remote lacunar infarcts in the basal ganglia. Electronically Signed   By: Keith Rake M.D.   On: 02/26/2018 21:08   Dg Chest Port 1 View  Result Date: 03/04/2018 CLINICAL DATA:  Cardiac arrest. EXAM: PORTABLE CHEST 1 VIEW COMPARISON:  Radiograph earlier this day. FINDINGS: Cardiac enlargement partially obscured by large retrocardiac hiatal hernia. Slight decreased gaseous gastric distension from prior. Aortic atherosclerosis. Mild vascular congestion without overt pulmonary edema. Probable bibasilar atelectasis with possible small pleural effusions. Improved linear right lung base atelectasis from prior. No pneumothorax. IMPRESSION: 1. Probable bibasilar  atelectasis and possible pleural effusions. Vascular congestion. 2. Cardiac enlargement, partially obscured by large hiatal hernia. Slight decreased gaseous gastric distension from prior. Electronically Signed   By: Keith Rake M.D.   On: 02/24/2018 23:22   Dg Chest Portable 1 View  Result Date: 03/21/2018 CLINICAL DATA:  Respiratory distress. EXAM: PORTABLE CHEST 1 VIEW COMPARISON:  12/18/2016. FINDINGS: Stable enlarged cardiac silhouette. Interval gaseous distention of the stomach, including a large hiatal hernia. Interval linear atelectasis at the right lung base. Clear left lung. Diffuse osteopenia. IMPRESSION: 1. Interval gaseous distention of the stomach, including a large hiatal hernia. 2. Interval linear atelectasis at the right lung base. Electronically Signed   By: Claudie Revering M.D.   On: 03/08/2018 19:18   Dg Abd Portable 1v  Result Date: 02/28/2018 CLINICAL DATA:  Nasogastric tube placement EXAM: PORTABLE ABDOMEN - 1 VIEW COMPARISON:  Portable exam 0138 hours compared to 03/06/2018 and correlated with prior CT abdomen and pelvis exam of 12/18/2016 FINDINGS: Nasogastric tube coiled in a large hiatal hernia at the lower LEFT hemithorax. Gaseous distention of the distal stomach below the diaphragm. Air-filled loops of nondistended bowel throughout abdomen and pelvis. Bones demineralized. External pacing leads noted. IMPRESSION: Tip of nasogastric tube is coiled within a large hiatal hernia at the inferior LEFT chest. Electronically Signed   By: Elta Guadeloupe  Thornton Papas M.D.   On: 02/28/2018 02:00     Assessment and Recommendation  77 y.o. female with known cardiovascular disease peripheral vascular disease acute on chronic kidney injury with glomerular filtration rate of 14 aortic valve stenosis with apparent cardiac arrest most consistent with possible aspiration or other causes having acute myocardial infarction with no current residual symptoms   1.  Continue medication management treatment for  cardiovascular disease and non-ST elevation myocardial infarction including antiplatelet medication management with aspirin and Plavix 2.  High intensity cholesterol therapy 3.  Follow for any further significant symptoms requiring further adjustments of medication management 4.  Discussed with family in depth about cardiovascular concerns and difficulty of further intervention and family is understanding and wishes no further invasive procedures to be performed at this time. 5.  Call if further questions  Signed, Serafina Royals M.D. FACC

## 2018-03-02 NOTE — Progress Notes (Signed)
SLP Cancellation Note  Patient Details Name: CERINITY ZYNDA MRN: [id number] DOB: 08-11-1940   Cancelled treatment:       Reason Eval/Treat Not Completed: Patient at procedure or test/unavailable(chart reviewed; consulted NSG then met w/ pt, Son). Pt is receiving a perm cath this afternoon and is NPO. ST services will f/u tomorrow w/ toleration of oral diet; education. Son, pt agreed. NSG agreed.    Orinda Kenner, MS, CCC-SLP Vivica Dobosz 03/13/2018, 2:10 PM

## 2018-03-02 NOTE — Op Note (Signed)
OPERATIVE NOTE   PROCEDURE: 1. Insertion of tunneled dialysis catheter right IJ approach with ultrasound and fluoroscopic guidance.  PRE-OPERATIVE DIAGNOSIS: End-stage renal disease   POST-OPERATIVE DIAGNOSIS: Same  SURGEON: Hortencia Pilar.  ANESTHESIA: Conscious sedation was administered under my direct supervision by the interventional radiology RN. IV Versed plus fentanyl were utilized. Continuous ECG, pulse oximetry and blood pressure was monitored throughout the entire procedure. Conscious sedation was for a total of 22 minutes.  ESTIMATED BLOOD LOSS: Minimal cc  CONTRAST USED:  None  FLUOROSCOPY TIME: 0.2 minutes  INDICATIONS:   Brianna Coleman Holbrookis a 77 y.o. y.o. female who presents with end-stage renal disease she is now undergoing placement of a dialysis catheter.  Risks and benefits of been reviewed with family all questions answered they agreed to proceed.  DESCRIPTION: After obtaining full informed written consent, the patient was positioned supine. The right neck and chest wall was prepped and draped in a sterile fashion. Ultrasound was placed in a sterile sleeve. Ultrasound was utilized to identify the right internal jugular vein which is noted to be echolucent and compressible indicating patency. Image is recorded for the permanent record. Under direct ultrasound visualization a micro-needle is inserted into the vein followed by the micro-wire. Micro-sheath was then advanced and a J wire is inserted without difficulty under fluoroscopic guidance. Small counterincision was made at the wire insertion site. Dilators are passed over the wire and the tunneled dialysis catheter is fed into the central venous system without difficulty.  Under fluoroscopy the catheter tip positioned at the atrial caval junction. The catheter is then approximated to the chest wall and an exit site selected. 1% lidocaine is infiltrated in soft tissues at this level small incision is made and the  tunneling device is then passed from the exit site to the neck counterincision. Catheter is then connected to the tunneling device and the catheter was pulled subcutaneously. It is then transected and the hub assembly connected without difficulty. Both lumens aspirate and flush easily. After verification of smooth contour with proper tip position under fluoroscopy the catheter is packed with 5000 units of heparin per lumen.  Catheter secured to the skin of the right chest with 0 silk. A sterile dressing is applied with a Biopatch.  COMPLICATIONS: None  CONDITION: Good  Hortencia Pilar [facility]. Office:  [phone]   03/13/2018,3:50 PM

## 2018-03-02 NOTE — Plan of Care (Signed)
PMT note:  Patient is currently in cath lab. Will reattempt visit tomorrow.

## 2018-03-02 NOTE — Progress Notes (Signed)
   03/11/2018 1330  Clinical Encounter Type  Visited With Patient not available  Visit Type Follow-up  Spiritual Encounters  Spiritual Needs Prayer   Chaplain attempted follow-up.  Neither patient nor family present in room.  Chaplain offered silent and energetic prayers for patient, family and care team.

## 2018-03-03 ENCOUNTER — Inpatient Hospital Stay: Payer: Medicare Other

## 2018-03-03 ENCOUNTER — Encounter: Payer: Self-pay | Admitting: Vascular Surgery

## 2018-03-03 DIAGNOSIS — J9601 Acute respiratory failure with hypoxia: Principal | ICD-10-CM

## 2018-03-03 LAB — CBC WITH DIFFERENTIAL/PLATELET
BASOS PCT: 0 %
Basophils Absolute: 0 10*3/uL (ref 0–0.1)
Eosinophils Absolute: 0 10*3/uL (ref 0–0.7)
Eosinophils Relative: 0 %
HCT: 24.3 % — ABNORMAL LOW (ref 35.0–47.0)
HEMOGLOBIN: 8 g/dL — AB (ref 12.0–16.0)
LYMPHS ABS: 0.3 10*3/uL — AB (ref 1.0–3.6)
Lymphocytes Relative: 4 %
MCH: 31.5 pg (ref 26.0–34.0)
MCHC: 33 g/dL (ref 32.0–36.0)
MCV: 95.5 fL (ref 80.0–100.0)
Monocytes Absolute: 0.5 10*3/uL (ref 0.2–0.9)
Monocytes Relative: 6 %
NEUTROS PCT: 90 %
Neutro Abs: 8.1 10*3/uL — ABNORMAL HIGH (ref 1.4–6.5)
Platelets: 111 10*3/uL — ABNORMAL LOW (ref 150–440)
RBC: 2.55 MIL/uL — AB (ref 3.80–5.20)
RDW: 16.6 % — ABNORMAL HIGH (ref 11.5–14.5)
WBC: 9.1 10*3/uL (ref 3.6–11.0)

## 2018-03-03 LAB — BLOOD GAS, ARTERIAL
Acid-base deficit: 1.2 mmol/L (ref 0.0–2.0)
Bicarbonate: 23.5 mmol/L (ref 20.0–28.0)
FIO2: 0.7
MECHVT: 450 mL
Mechanical Rate: 16
O2 SAT: 99.8 %
PCO2 ART: 38 mmHg (ref 32.0–48.0)
PEEP: 5 cmH2O
Patient temperature: 37
pH, Arterial: 7.4 (ref 7.350–7.450)
pO2, Arterial: 211 mmHg — ABNORMAL HIGH (ref 83.0–108.0)

## 2018-03-03 MED ORDER — SODIUM CHLORIDE 0.9 % IV SOLN: 100.0000 mL | INTRAVENOUS | Status: DC | PRN

## 2018-03-03 MED ORDER — PENTAFLUOROPROP-TETRAFLUOROETH EX AERO
1.0000 "application " | INHALATION_SPRAY | CUTANEOUS | Status: DC | PRN
  Filled 2018-03-03: qty 30

## 2018-03-03 MED ORDER — FENTANYL CITRATE (PF) 100 MCG/2ML IJ SOLN
50.0000 ug | Freq: Once | INTRAMUSCULAR | Status: AC
  Administered 2018-03-03: 50 ug via INTRAVENOUS

## 2018-03-03 MED ORDER — HEPARIN SODIUM (PORCINE) 1000 UNIT/ML DIALYSIS: 1000.0000 [IU] | INTRAMUSCULAR | Status: DC | PRN

## 2018-03-03 MED ORDER — ASPIRIN 81 MG PO CHEW
81.0000 mg | CHEWABLE_TABLET | Freq: Every day | ORAL | Status: DC
  Administered 2018-03-04 – 2018-03-08 (×4): 81 mg
  Filled 2018-03-03 (×4): qty 1

## 2018-03-03 MED ORDER — FENTANYL BOLUS VIA INFUSION
25.0000 ug | INTRAVENOUS | Status: DC | PRN
  Filled 2018-03-03: qty 25

## 2018-03-03 MED ORDER — PROPOFOL 1000 MG/100ML IV EMUL
0.0000 ug/kg/min | INTRAVENOUS | Status: DC
  Administered 2018-03-03: 5 ug/kg/min via INTRAVENOUS
  Administered 2018-03-04: 20 ug/kg/min via INTRAVENOUS
  Administered 2018-03-04: 25 ug/kg/min via INTRAVENOUS
  Filled 2018-03-03 (×3): qty 100

## 2018-03-03 MED ORDER — ACETAMINOPHEN 325 MG PO TABS: 650.0000 mg | ORAL_TABLET | ORAL | Status: DC | PRN

## 2018-03-03 MED ORDER — DOCUSATE SODIUM 50 MG/5ML PO LIQD: 100.0000 mg | Freq: Two times a day (BID) | ORAL | Status: DC | PRN

## 2018-03-03 MED ORDER — PANTOPRAZOLE SODIUM 40 MG PO PACK
40.0000 mg | PACK | Freq: Every day | ORAL | Status: DC
  Administered 2018-03-04: 40 mg
  Filled 2018-03-03: qty 20

## 2018-03-03 MED ORDER — NOREPINEPHRINE 4 MG/250ML-% IV SOLN
0.0000 ug/min | INTRAVENOUS | Status: DC
  Administered 2018-03-03: 5 ug/min via INTRAVENOUS
  Filled 2018-03-03: qty 250

## 2018-03-03 MED ORDER — FENTANYL 2500MCG IN NS 250ML (10MCG/ML) PREMIX INFUSION
25.0000 ug/h | INTRAVENOUS | Status: DC
  Administered 2018-03-03 – 2018-03-04 (×2): 100 ug/h via INTRAVENOUS
  Filled 2018-03-03 (×2): qty 250

## 2018-03-03 MED ORDER — PRAMIPEXOLE DIHYDROCHLORIDE 1 MG PO TABS
1.0000 mg | ORAL_TABLET | Freq: Three times a day (TID) | ORAL | Status: DC
  Administered 2018-03-03 – 2018-03-08 (×9): 1 mg
  Filled 2018-03-03 (×16): qty 1

## 2018-03-03 MED ORDER — ATORVASTATIN CALCIUM 20 MG PO TABS
80.0000 mg | ORAL_TABLET | Freq: Every evening | ORAL | Status: DC
  Administered 2018-03-04 – 2018-03-07 (×2): 80 mg
  Filled 2018-03-03 (×2): qty 4

## 2018-03-03 MED ORDER — INFLUENZA VAC SPLIT HIGH-DOSE 0.5 ML IM SUSY
0.5000 mL | PREFILLED_SYRINGE | INTRAMUSCULAR | Status: DC
  Filled 2018-03-03: qty 0.5

## 2018-03-03 MED ORDER — ALTEPLASE 2 MG IJ SOLR: 2.0000 mg | Freq: Once | INTRAMUSCULAR | Status: DC | PRN

## 2018-03-03 MED ORDER — CLOPIDOGREL BISULFATE 75 MG PO TABS
75.0000 mg | ORAL_TABLET | Freq: Every day | ORAL | Status: DC
  Administered 2018-03-04 – 2018-03-08 (×4): 75 mg
  Filled 2018-03-03 (×4): qty 1

## 2018-03-03 MED ORDER — NOREPINEPHRINE 16 MG/250ML-% IV SOLN
0.0000 ug/min | INTRAVENOUS | Status: DC
  Administered 2018-03-04: 0.5 ug/min via INTRAVENOUS
  Filled 2018-03-03: qty 250

## 2018-03-03 MED ORDER — LIDOCAINE-PRILOCAINE 2.5-2.5 % EX CREA
1.0000 "application " | TOPICAL_CREAM | CUTANEOUS | Status: DC | PRN
  Filled 2018-03-03: qty 5

## 2018-03-03 MED ORDER — LIDOCAINE HCL (PF) 1 % IJ SOLN
5.0000 mL | INTRAMUSCULAR | Status: DC | PRN
  Filled 2018-03-03: qty 5

## 2018-03-03 MED ORDER — CARBIDOPA-LEVODOPA 25-100 MG PO TABS
1.0000 | ORAL_TABLET | Freq: Four times a day (QID) | ORAL | Status: DC
  Administered 2018-03-03 – 2018-03-08 (×11): 1
  Filled 2018-03-03 (×21): qty 1

## 2018-03-03 NOTE — Consult Note (Signed)
Consultation Note Date: 03/03/2018   Patient Name: Brianna Coleman  DOB: 08/22/1940  MRN: [id number]  Age / Sex: 77 y.o., female  PCP: Maryland Pink, MD Referring Physician: Nicholes Mango, MD  Reason for Consultation: Establishing goals of care  HPI/Patient Profile: Brianna Coleman  is a 77 y.o. female who presents with chief complaint as above.  Patient has been having short episodes of diaphoresis and epigastric discomfort for the past 3 days or so.  These have been short-lived and self resolving.  Today however, she had the same symptoms that lasted significantly longer and were not resolving on their own.  She was brought to the ED for evaluation and when she arrived here underwent cardiac arrest.  She was resuscitated with 1 round of epinephrine and CPR.  She was able to become alert again, did not require intubation, but was placed on BiPAP.  BIPAP was discontinued when appropriate.    Clinical Assessment and Goals of Care: Patient is resting in bed. Her speech is difficult to understand. Son [name] is at bedside. 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   Functionally, she walks with a walker, but has freezing spells and can fall. She requires assistance with bathing. She can feed herself solid food, but is now moving to pureed foods and needs assistance. She began to decline since her [place] a year ago. Her speech is difficult to understand at baseline.   We discussed her diagnosis, prognosis, GOC, EOL wishes disposition and options.  [name] states the plan is to start hemodialysis today, with a goal for peritoneal dialysis. If she does not tolerate it or does not wish to continue it, she will stop dialysis. The family wants to honor her wishes and feels her quality of life is very important.   He states she would want  chest compressions, shocks, and a breathing tube if needed. She agrees with this. She would want a time trial of a ventilator. She would never want a trach.   [name] inquired about information of comfort care if she ever decided to go that route. Attempted to discuss the difference between palliative and hospice, but he states he would like information such as brochures but not to discuss it now as it upsets her. Patient agrees. Patient is okay with me calling her daughter [name]) [name], and voicemail was left.        SUMMARY OF RECOMMENDATIONS   Continue current care.  Voicemail left for POA to schedule meeting if able, she works 3rd shift. [name] states she likely will not be able to come until the weekend.    Code Status/Advance Care Planning:  Full code    Symptom Management:   Per primary team.   Palliative Prophylaxis:   Eye Care and Oral Care   Prognosis:   Poor long term.  Discharge Planning: To Be Determined      Primary Diagnoses: Present on Admission: .  Essential (primary) hypertension . HLD (hyperlipidemia) . Parkinson's disease ([place]) . Cardiac arrest ([place]) . Acute renal failure superimposed on chronic kidney disease ([place])   I have reviewed the medical record, interviewed the patient and family, and examined the patient. The following aspects are pertinent.  Past Medical History:  Diagnosis Date  . Anemia   . Aortic stenosis    mild AS by 03/20/13 echo [facility])  . Arthritis, degenerative   . Blood in stool   . Cancer ([place])    skin cancer  . 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 Chronic renal insufficiency    CKD stage IV, sees Dr Holley Raring in [place]  . Dyslipidemia   . Dysphagia   . Episode of syncope    Near-syncope  . Fall   . Gait disorder   . Gastroesophageal reflux disease   . Heart murmur   . Hiatal hernia with GERD   . History of blood transfusion   . Hypertension   . Memory disturbance   . Neurogenic  bladder   . Orthostatic hypotension 06/17/2017  . Parkinson disease ([place])   . 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 RLS (restless legs syndrome)   . Shortness of breath    with exertion  . Stroke [facility])    no residual  . 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 Marital status: Widowed    Spouse name: Not on file  . Number of children: 2  . Years of education: hs  . Highest education level: Not on file  Occupational History  . Occupation: Retired    Fish farm manager: RETIRED  Social Needs  . Financial resource strain: Not hard at all  . Food insecurity:    Worry: Patient refused    Inability: Patient refused  . Transportation needs:    Medical: Patient refused    Non-medical: Patient refused  Tobacco Use  . Smoking status: Never Smoker  . Smokeless tobacco: Never Used  Substance and Sexual Activity  . Alcohol use: No  . Drug use: No  . Sexual activity: Not Currently  Lifestyle  . Physical activity:    Days per week: Patient refused    Minutes per session: Patient refused  . Stress: Patient refused  Relationships  . Social connections:    Talks on phone: Patient refused    Gets together: Patient refused    Attends religious service: Patient refused    Active member of club or organization: Patient refused    Attends meetings of clubs or organizations: Patient refused    Relationship status: Patient refused  Other Topics Concern  . Not on file  Social History Narrative   Patient drinks caffeine occasionally.   Patient is right handed.   Family History  Problem Relation Age of Onset  . Hypertension Mother   . Heart disease Mother   . Hyperlipidemia Mother   . Breast cancer Mother   . Heart attack Father   . Hypertension Father   . Colon cancer Father   . Lung cancer Father   . Diabetes Brother   . Diabetes Brother   . Heart disease Brother   . Hyperlipidemia Brother   . Hypertension Brother     . Varicose Veins Brother   . Deep vein thrombosis Brother   . Hypertension Sister   . Stomach cancer Maternal Grandmother    Scheduled Meds: . aspirin  81 mg Oral Daily  .  atorvastatin  80 mg Oral QPM  . carbidopa-levodopa  1 tablet Oral QID  . chlorhexidine  15 mL Mouth Rinse BID  . Chlorhexidine Gluconate Cloth  6 each Topical Q0600  . clopidogrel  75 mg Oral Daily  . feeding supplement (NEPRO CARB STEADY)  237 mL Oral BID BM  . heparin injection (subcutaneous)  5,000 Units Subcutaneous Q8H  . [START ON 03/04/2018] Influenza vac split quadrivalent PF  0.5 mL Intramuscular Tomorrow-1000  . mouth rinse  15 mL Mouth Rinse q12n4p  . Melatonin  2.5 mg Oral QHS  . [START ON 03/04/2018] pantoprazole sodium  40 mg Per Tube Daily  . pramipexole  1 mg Oral TID  . tuberculin  5 Units Intradermal Once   Continuous Infusions: . sodium chloride Stopped (03/01/18 0031)  . sodium chloride    . sodium chloride    . ampicillin-sulbactam (UNASYN) IV Stopped (03/08/2018 1850)   PRN Meds:.sodium chloride, sodium chloride, sodium chloride, acetaminophen, alteplase, heparin, ipratropium-albuterol, ketorolac, lidocaine (PF), lidocaine-prilocaine, pentafluoroprop-tetrafluoroeth Medications Prior to Admission:  Prior to Admission medications   Medication Sig Start Date End Date Taking? Authorizing Provider  allopurinol (ZYLOPRIM) 100 MG tablet Take 100 mg by mouth daily. 02/17/18  Yes [provider]  aspirin EC 81 MG tablet Take 81 mg by mouth daily.   Yes [provider]  atorvastatin (LIPITOR) 80 MG tablet Take 80 mg by mouth every evening. 02/15/18  Yes [provider]  Carbidopa-Levodopa ER (SINEMET CR) 25-100 MG tablet controlled release TAKE 1 TABLET BY MOUTH FOUR TIMES A DAY 10/27/17  Yes Kathrynn Ducking, MD  cholecalciferol (VITAMIN D) 1000 UNITS tablet Take 1,000 Units by mouth daily.   Yes [provider]  clopidogrel (PLAVIX) 75 MG tablet Take 1 tablet (75 mg  total) by mouth daily. 05/25/17  Yes Henreitta Leber, MD  conjugated estrogens (PREMARIN) vaginal cream Place 1 Applicatorful vaginally daily as needed.    Yes [provider]  docusate sodium (COLACE) 100 MG capsule TAKE 1 CAPSULE (100 MG TOTAL) BY MOUTH DAILY. DO NOT TAKE IF YOU HAVE LOOSE STOOLS 11/03/17  Yes Earlie Server, MD  donepezil (ARICEPT) 5 MG tablet Take 1 tablet (5 mg total) by mouth at bedtime. 10/25/17  Yes Kathrynn Ducking, MD  ferrous gluconate (FERGON) 324 MG tablet TAKE 1 TABLET (324 MG TOTAL) BY MOUTH 2 (TWO) TIMES DAILY WITH A MEAL. 11/03/17  Yes Earlie Server, MD  NIFEdipine (PROCARDIA-XL/ADALAT CC) 30 MG 24 hr tablet Take 30 mg by mouth daily. 09/07/17  Yes [provider]  ondansetron (ZOFRAN-ODT) 4 MG disintegrating tablet Take 4 mg by mouth every 8 (eight) hours as needed for nausea.  09/20/17  Yes [provider]  pantoprazole (PROTONIX) 40 MG tablet Take 40 mg by mouth daily. 09/07/17  Yes [provider]  pramipexole (MIRAPEX) 1 MG tablet Take 1 mg by mouth 3 (three) times daily.   Yes [provider]  QUEtiapine (SEROQUEL) 25 MG tablet Take 1 tablet (25 mg total) by mouth at bedtime. 01/11/18  Yes Kathrynn Ducking, MD  ranitidine (ZANTAC) 150 MG tablet Take 150 mg by mouth daily. 06/29/17  Yes [provider]  folic acid (FOLVITE) 782 MCG tablet TAKE 1 TABLET (400 MCG TOTAL) BY MOUTH DAILY. 02/28/18   Earlie Server, MD   No Known Allergies Review of Systems  All other systems reviewed and are negative.   Physical Exam  Constitutional: No distress.  Pulmonary/Chest: Effort normal.  Neurological: She is  alert.  Skin: Skin is warm and dry.    Vital Signs: BP 118/62   Pulse 83   Temp 98.8 F (37.1 C)   Resp 18   Ht 5\' 3"  (1.6 m)   Wt 55.8 kg   SpO2 100%   BMI 21.79 kg/m  Pain Scale: 0-10 POSS *See Group Information*: 2-Acceptable,Slightly drowsy, easily aroused Pain Score: 0-No pain   SpO2: SpO2: 100 % O2 Device:SpO2: 100  % O2 Flow Rate: .O2 Flow Rate (L/min): 3 L/min  IO: Intake/output summary:   Intake/Output Summary (Last 24 hours) at 03/03/2018 1347 Last data filed at 03/03/2018 1234 Gross per 24 hour  Intake 480.07 ml  Output 840 ml  Net -359.93 ml    LBM: Last BM Date: 03/17/2018 Baseline Weight: Weight: 55.3 kg Most recent weight: Weight: 55.8 kg     Palliative Assessment/Data: 40%     Time In: 1:00 Time Out: 1:50 Time Total: 50 min Greater than 50%  of this time was spent counseling and coordinating care related to the above assessment and plan.  Signed by: Asencion Gowda, NP   Please contact Palliative Medicine Team phone at [phone] for questions and concerns.  For individual provider: See Shea Evans

## 2018-03-03 NOTE — Progress Notes (Signed)
   03/03/18 2040  Clinical Encounter Type  Visited With Family  Visit Type Code  Referral From Nurse  Consult/Referral To Chaplain  Spiritual Encounters  Spiritual Needs Emotional  Brianna Coleman arrived at patient's room to see several members of care team responding to code. Was informed that patient's son was in waiting room. Son seemed anxious of mother's condition. [name] shared with son that care team member will arrive shortly and share patient's condition. Waited mostly in silence for several moments. Was upset with [name] informed him that patient was intubated. He expressed that he discussed desire for heroic attempts be made up to intubation. Desired natural death if intubation was an option. Once care team arrived, patient shared his concerns of being told to leave mother's side in spite of his wish to stay. Was frustrated when he discovered mother removed breathing machine and no one was bedside for several minutes. Son, Brianna Coleman, was informed he can go bedside once patient was stable. Provided pastoral care by being a non-anxious presence. Validated son's emotions/feelings. Follow up visits maybe appreciated for patient as she is religious.

## 2018-03-03 NOTE — Progress Notes (Signed)
ETT pulled back 1cm per NP Blakeney. Pt ETT secured 20 cm at lip.

## 2018-03-03 NOTE — Progress Notes (Signed)
Pt found in room with oxygen saturations in the 60s.  Dialysis RN at bedside.  Noted that the Bi-PAP mask was broken by the patient and unable to be reapplied.  Notified RT therapy.  Began bagging the patient via ambu bag.  Pt became increasingly bradycardic and noted to be pulseless.  CPR initiated at 2044.  Pt received 1 dose of epi before return of ROSC.  Pt intubated via RT at 2057.

## 2018-03-03 NOTE — Progress Notes (Signed)
[facility]  ROUNDING NOTE   Subjective:  Patient seen at bedside. PermCath has been placed. Yesterday creatinine was 4.54 with a BUN of 88.   Objective:  Vital signs in last 24 hours:  Temp:  [98.1 F (36.7 C)-99.5 F (37.5 C)] 98.4 F (36.9 C) (09/12 0800) Pulse Rate:  [80-102] 89 (09/12 0800) Resp:  [6-26] 19 (09/12 0800) BP: (89-166)/(43-90) 166/61 (09/12 0800) SpO2:  [85 %-100 %] 100 % (09/12 0800) FiO2 (%):  [40 %] 40 % (09/12 0520) Weight:  [55.8 kg-56.2 kg] 55.8 kg (09/12 0500)  Weight change: 0 kg Filed Weights   02/23/2018 0349 03/07/2018 1338 03/03/18 0500  Weight: 56.2 kg 56.2 kg 55.8 kg    Intake/Output: I/O last 3 completed shifts: In: 180.1 [P.O.:80; IV Piggyback:100.1] Out: 990 [Urine:990]   Intake/Output this shift:  No intake/output data recorded.  Physical Exam: General: No acute distress  Head: Normocephalic, atraumatic. Moist oral mucosal membranes  Eyes: Anicteric  Neck: Supple, trachea midline  Lungs:  Clear to auscultation, normal effort  Heart: S1S2 no rubs  Abdomen:  Soft, nontender, bowel sounds present  Extremities: trace peripheral edema.  Neurologic: Awake, alert, answers simple questions  Skin: No lesions       Basic Metabolic Panel: Recent Labs  Lab 03/20/2018 1841 03/01/2018 2314 03/01/18 0532 03/20/2018 0829  NA 139 142 144 147*  K 5.0 3.6 3.7 3.7  CL 108 110 112* 113*  CO2 14* 21* 23 25  GLUCOSE 340* 202* 126* 120*  BUN 45* 52* 70* 88*  CREATININE 3.57* 3.47* 4.13* 4.54*  CALCIUM 8.9 8.2* 8.1* 8.4*  MG  --  2.0  --   --   PHOS  --  4.1  --  5.1*    Liver Function Tests: Recent Labs  Lab 03/06/2018 1841 03/16/2018 2314 03/01/2018 0829  AST 31 82*  --   ALT 5 10  --   ALKPHOS 81 76  --   BILITOT 0.7 0.6  --   PROT 6.3* 6.2*  --   ALBUMIN 3.7 3.4* 3.0*   Recent Labs  Lab 02/25/2018 1841  LIPASE 85*   No results for input(s): AMMONIA in the last 168 hours.  CBC: Recent Labs  Lab 02/28/2018 1841  03/01/18 0532  WBC 9.7 10.7  NEUTROABS 4.9 9.7*  HGB 10.6* 8.7*  HCT 33.3* 25.9*  MCV 100.5* 95.9  PLT 134* 100*    Cardiac Enzymes: Recent Labs  Lab 03/01/2018 2043 03/07/2018 2314 02/28/18 0446 02/28/18 1057 03/01/18 0532  TROPONINI 0.07* 0.76* 3.57* 4.43* 2.57*    BNP: Invalid input(s): POCBNP  CBG: Recent Labs  Lab 03/21/2018 2232 03/01/18 1509 03/06/2018 0738  GLUCAP 207* 122* 101*    Microbiology: Results for orders placed or performed during the hospital encounter of 02/26/2018  Blood culture (routine x 2)     Status: None (Preliminary result)   Collection Time: 03/21/2018  7:54 PM  Result Value Ref Range Status   Specimen Description BLOOD RIGHT WRIST  Final   Special Requests   Final    BOTTLES DRAWN AEROBIC AND ANAEROBIC Blood Culture results may not be optimal due to an excessive volume of blood received in culture bottles   Culture   Final    NO GROWTH 4 DAYS Performed at [facility], [address], [place], [place] [address]    Report Status PENDING  Incomplete  MRSA PCR Screening     Status: None   Collection Time: 03/10/2018 10:39 PM  Result Value Ref Range Status   MRSA by PCR NEGATIVE NEGATIVE Final    Comment:        The GeneXpert MRSA Assay (FDA approved for NASAL specimens only), is one component of a comprehensive MRSA colonization surveillance program. It is not intended to diagnose MRSA infection nor to guide or monitor treatment for MRSA infections. Performed at [facility], [address] [place], [place] [address]   Blood culture (routine x 2)     Status: None (Preliminary result)   Collection Time: 03/09/2018 11:12 PM  Result Value Ref Range Status   Specimen Description BLOOD LEFT ARM  Final   Special Requests   Final    BOTTLES DRAWN AEROBIC AND ANAEROBIC Blood Culture results may not be optimal due to an inadequate volume of blood received in culture bottles   Culture   Final    NO GROWTH 4 DAYS Performed at  [facility], [address], [place],  [address]    Report Status PENDING  Incomplete    Coagulation Studies: No results for input(s): LABPROT, INR in the last 72 hours.  Urinalysis: No results for input(s): COLORURINE, LABSPEC, PHURINE, GLUCOSEU, HGBUR, BILIRUBINUR, KETONESUR, PROTEINUR, UROBILINOGEN, NITRITE, LEUKOCYTESUR in the last 72 hours.  Invalid input(s): APPERANCEUR    Imaging: No results found.   Medications:   . sodium chloride Stopped (03/01/18 0031)  . ampicillin-sulbactam (UNASYN) IV Stopped (02/24/2018 1850)   . aspirin  81 mg Oral Daily  . atorvastatin  80 mg Oral QPM  . carbidopa-levodopa  1 tablet Oral QID  . chlorhexidine  15 mL Mouth Rinse BID  . Chlorhexidine Gluconate Cloth  6 each Topical Q0600  . clopidogrel  75 mg Oral Daily  . feeding supplement (NEPRO CARB STEADY)  237 mL Oral BID BM  . heparin injection (subcutaneous)  5,000 Units Subcutaneous Q8H  . mouth rinse  15 mL Mouth Rinse q12n4p  . Melatonin  2.5 mg Oral QHS  . pantoprazole (PROTONIX) IV  40 mg Intravenous Daily  . pramipexole  1 mg Oral TID  . tuberculin  5 Units Intradermal Once   sodium chloride, acetaminophen, ipratropium-albuterol, ketorolac  Assessment/ Plan:  77 y.o. female with past medical history of hypertension, Parkinson's disorder, GI bleed, overactive bladder, iron deficiency anemia, hyperlipidemia, chronic musculoskeletal low back pain, chronic lower extremity edema, GERD, right basal ganglia CVA and vitamin D deficiency admitted with cardiac arrest.   1.  Acute renal failure/chronic kidney disease stage IV baseline EGFR 16.  Suspect that her acute renal failure now attributable to recent cardiac arrest.   -BUN currently 4.54 with a BUN of 88.  Right internal jugular PermCath has been placed.  We will plan for initiation of hemodialysis with treatment of 1.5 hours, blood flow rate 150, dialysate flow rate 300, no ultrafiltration.  Second treatment  tomorrow.  2.  Anemia of chronic kidney disease.  We will consider Epogen as an outpatient but hold off for now given recent myocardial infarction.  3.  Secondary hyperparathyroidism.  Continue to monitor bone mineral metabolism parameters.   LOS: 4 Zyasia Halbleib 9/12/20198:45 AM

## 2018-03-03 NOTE — Procedures (Signed)
Central Venous Catheter Insertion Procedure Note SOLOMIA HARRELL [id number] Feb 14, 1941  Procedure: Insertion of Central Venous Catheter Indications: Assessment of intravascular volume, Drug and/or fluid administration and Frequent blood sampling  Procedure Details Consent: Risks of procedure as well as the alternatives and risks of each were explained to the (patient/caregiver).  Consent for procedure obtained. Time Out: Verified patient identification, verified procedure, site/side was marked, verified correct patient position, special equipment/implants available, medications/allergies/relevent history reviewed, required imaging and test results available.  Performed  Maximum sterile technique was used including antiseptics, cap, gloves, gown, hand hygiene, mask and sheet. Skin prep: Chlorhexidine; local anesthetic administered A antimicrobial bonded/coated triple lumen catheter was placed in the left femoral vein due to multiple attempts, no other available access using the Seldinger technique.  Evaluation Blood flow good Complications: No apparent complications Patient did tolerate procedure well. Chest X-ray ordered to verify placement.  CXR: pending.  Attempted to place left internal jugular CVL utilizing ultrasound, however unable to advance guidewire therefore placed left femoral CVL utilizing ultrasound no complications noted during or following procedure.  Marda Stalker, [place] Pager [phone] (please enter 7 digits) PCCM Consult Pager [phone] (please enter 7 digits)

## 2018-03-03 NOTE — Progress Notes (Signed)
1st attempt to get report prior to HD tx , RN unavailable.    03/03/18 1140  Hand-Off documentation  Report given to (Full Name) Stark Bray  Report received from (Full Name) Delene Ruffini

## 2018-03-03 NOTE — Progress Notes (Signed)
Follow up - Critical Care Medicine Note  Patient Details:    Brianna Coleman is an 77 y.o. female. Severe Parkinson's, orthostatic hypertension, REM behavior disorder, hyperlipidemia, aortic stenosis, hypertension, CVA,status post episode of epigastric pain, brief cardiac arrest and respiratory distress. Has improved mentation and weaned off of BiPAP  Lines, Airways, Drains: Urethral Catheter Megan RN Non-latex 14 Fr. (Active)  Indication for Insertion or Continuance of Catheter Unstable critical patients (first 24-48 hours) 03/01/2018  4:00 AM  Site Assessment Clean;Intact 03/01/2018  4:00 AM  Catheter Maintenance Bag below level of bladder;Catheter secured;Drainage bag/tubing not touching floor;No dependent loops;Insertion date on drainage bag;Seal intact 03/01/2018  4:00 AM  Collection Container Standard drainage bag 03/01/2018  4:00 AM  Output (mL) 100 mL 03/01/2018  4:00 AM    Anti-infectives:  Anti-infectives (From admission, onward)   Start     Dose/Rate Route Frequency Ordered Stop   03/03/18 0000  ceFAZolin (ANCEF) IVPB 1 g/50 mL premix  Status:  Discontinued     1 g 100 mL/hr over 30 Minutes Intravenous On call 03/04/2018 1400 03/01/2018 2331   03/15/2018 1400  ceFAZolin (ANCEF) IVPB 1 g/50 mL premix     1 g 100 mL/hr over 30 Minutes Intravenous  Once 03/07/2018 1351 03/04/2018 1515   02/28/18 1800  Ampicillin-Sulbactam (UNASYN) 3 g in sodium chloride 0.9 % 100 mL IVPB     3 g 200 mL/hr over 30 Minutes Intravenous Every 24 hours 02/28/18 1140     02/28/18 0200  piperacillin-tazobactam (ZOSYN) IVPB 3.375 g  Status:  Discontinued     3.375 g 12.5 mL/hr over 240 Minutes Intravenous Every 12 hours 03/01/2018 2349 02/28/18 1008   03/03/2018 1930  ceFEPIme (MAXIPIME) 1 g in sodium chloride 0.9 % 100 mL IVPB     1 g 200 mL/hr over 30 Minutes Intravenous  Once 02/23/2018 1926 02/23/2018 2001      Microbiology: Results for orders placed or performed during the hospital encounter of 03/13/2018  Blood  culture (routine x 2)     Status: None (Preliminary result)   Collection Time: 03/09/2018  7:54 PM  Result Value Ref Range Status   Specimen Description BLOOD RIGHT WRIST  Final   Special Requests   Final    BOTTLES DRAWN AEROBIC AND ANAEROBIC Blood Culture results may not be optimal due to an excessive volume of blood received in culture bottles   Culture   Final    NO GROWTH 4 DAYS Performed at [facility], [address] [place], [place] [address]    Report Status PENDING  Incomplete  MRSA PCR Screening     Status: None   Collection Time: 02/20/2018 10:39 PM  Result Value Ref Range Status   MRSA by PCR NEGATIVE NEGATIVE Final    Comment:        The GeneXpert MRSA Assay (FDA approved for NASAL specimens only), is one component of a comprehensive MRSA colonization surveillance program. It is not intended to diagnose MRSA infection nor to guide or monitor treatment for MRSA infections. Performed at [facility], [address] [place], [place] [address]   Blood culture (routine x 2)     Status: None (Preliminary result)   Collection Time: 03/05/2018 11:12 PM  Result Value Ref Range Status   Specimen Description BLOOD LEFT ARM  Final   Special Requests   Final    BOTTLES DRAWN AEROBIC AND ANAEROBIC Blood Culture results may not be optimal due to an inadequate volume of  blood received in culture bottles   Culture   Final    NO GROWTH 4 DAYS Performed at [facility], [address] [place], [place] [address]    Report Status PENDING  Incomplete    Studies: Dg Abd 1 View  Result Date: 02/28/2018 CLINICAL DATA:  Abdominal pain EXAM: ABDOMEN - 1 VIEW COMPARISON:  None. FINDINGS: Gaseous distention of the stomach and small bowel. No dilated small bowel is visualized. Incompletely visualized cardiomegaly. IMPRESSION: Gas distended stomach. No radiographic evidence of small-bowel obstruction. Electronically Signed   By: Ulyses Jarred M.D.   On: 02/28/2018  00:11   Ct Head Wo Contrast  Result Date: 02/26/2018 CLINICAL DATA:  Altered level of consciousness, unexplained. EXAM: CT HEAD WITHOUT CONTRAST TECHNIQUE: Contiguous axial images were obtained from the base of the skull through the vertex without intravenous contrast. COMPARISON:  Head CT 11/30/2017 FINDINGS: Brain: No intracranial hemorrhage, mass effect, or midline shift. Unchanged atrophy and chronic small vessel ischemia. Unchanged remote lacunar infarcts in the right greater than left basal ganglia. Small remote left parietal infarct again seen. No cerebral edema. No hydrocephalus. The basilar cisterns are patent. No evidence of territorial infarct or acute ischemia. No extra-axial or intracranial fluid collection. Vascular: Atherosclerosis of skullbase vasculature without hyperdense vessel or abnormal calcification. Skull: No fracture or focal lesion. Sinuses/Orbits: Paranasal sinuses and mastoid air cells are clear. The visualized orbits are unremarkable. Other: None. IMPRESSION: 1.  No acute intracranial abnormality. 2. Unchanged atrophy, chronic small vessel ischemia, and remote lacunar infarcts in the basal ganglia. Electronically Signed   By: Keith Rake M.D.   On: 03/02/2018 21:08   Dg Chest Port 1 View  Result Date: 03/05/2018 CLINICAL DATA:  Cardiac arrest. EXAM: PORTABLE CHEST 1 VIEW COMPARISON:  Radiograph earlier this day. FINDINGS: Cardiac enlargement partially obscured by large retrocardiac hiatal hernia. Slight decreased gaseous gastric distension from prior. Aortic atherosclerosis. Mild vascular congestion without overt pulmonary edema. Probable bibasilar atelectasis with possible small pleural effusions. Improved linear right lung base atelectasis from prior. No pneumothorax. IMPRESSION: 1. Probable bibasilar atelectasis and possible pleural effusions. Vascular congestion. 2. Cardiac enlargement, partially obscured by large hiatal hernia. Slight decreased gaseous gastric distension  from prior. Electronically Signed   By: Keith Rake M.D.   On: 03/18/2018 23:22   Dg Chest Portable 1 View  Result Date: 02/25/2018 CLINICAL DATA:  Respiratory distress. EXAM: PORTABLE CHEST 1 VIEW COMPARISON:  12/18/2016. FINDINGS: Stable enlarged cardiac silhouette. Interval gaseous distention of the stomach, including a large hiatal hernia. Interval linear atelectasis at the right lung base. Clear left lung. Diffuse osteopenia. IMPRESSION: 1. Interval gaseous distention of the stomach, including a large hiatal hernia. 2. Interval linear atelectasis at the right lung base. Electronically Signed   By: Claudie Revering M.D.   On: 03/02/2018 19:18   Dg Abd Portable 1v  Result Date: 02/28/2018 CLINICAL DATA:  Nasogastric tube placement EXAM: PORTABLE ABDOMEN - 1 VIEW COMPARISON:  Portable exam 0138 hours compared to 03/17/2018 and correlated with prior CT abdomen and pelvis exam of 12/18/2016 FINDINGS: Nasogastric tube coiled in a large hiatal hernia at the lower LEFT hemithorax. Gaseous distention of the distal stomach below the diaphragm. Air-filled loops of nondistended bowel throughout abdomen and pelvis. Bones demineralized. External pacing leads noted. IMPRESSION: Tip of nasogastric tube is coiled within a large hiatal hernia at the inferior LEFT chest. Electronically Signed   By: Lavonia Dana M.D.   On: 02/28/2018 02:00    Consults: Treatment Team:  Buzz Axel, DO Corey Skains, MD Anthonette Legato, MD Schnier, Dolores Lory, MD   Subjective:    Overnight Issues:patient is status post PermCath placement without any difficulties is awake and communicating this morning looks very comfortable  Objective:  Vital signs for last 24 hours: Temp:  [98.1 F (36.7 C)-99.5 F (37.5 C)] 98.2 F (36.8 C) (09/12 0700) Pulse Rate:  [80-102] 87 (09/12 0700) Resp:  [6-26] 19 (09/12 0700) BP: (89-158)/(43-90) 150/85 (09/12 0700) SpO2:  [85 %-100 %] 96 % (09/12 0700) FiO2 (%):  [40 %] 40 % (09/12  0520) Weight:  [55.8 kg-56.2 kg] 55.8 kg (09/12 0500)  Hemodynamic parameters for last 24 hours:    Intake/Output from previous day: 09/11 0701 - 09/12 0700 In: 80.1 [P.O.:80; IV Piggyback:0.1] Out: 740 [Urine:740]  Intake/Output this shift: No intake/output data recorded.  Vent settings for last 24 hours: FiO2 (%):  [40 %] 40 %  Physical Exam:  Vital signs: Please see the above listed vital signs HEENT: Trachea midline, no oral lesions appreciated, no jugular venous distention, on nasal cannula at 3 L Cardiovascular: Regular rate and rhythm Pulmonary: Clear to auscultation Abdominal: Positive bowel sounds, soft exam Extremities: No clubbing cyanosis or edema noted Neurologic: Moves all extremities, is awake and interactive today did have some confusion last night  Assessment/Plan:   Respiratory failure. Doing quite well this morning, presently on nasal cannula, resting without any difficulty. Will repeat chest x-ray today  Severe Parkinson's with dementia. Placed back on her Sinemet  Orthostatic hypotension. Most likely in association with her Parkinson's  Positive cardiac enzymes. Peak troponin was 4.43, has decreased now to 2.57. Has been seen by cardiology and felt secondary to all of her comorbidities including chronic renal failure conservative management would be best  Chronic renal failure.She is followed by Dr. Holley Raring, in the outpatient setting [facility] Cath placement and pending dialysis today.   Anemia. No evidence of active bleeding  Questionable seizure activity. 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 During these events she loses muscle tone, does not lose consciousness however and can recall some of these events. The facial asymmetry may be muscle atonia, jaw  slacking, during the cataplexy episode. There are REM suppressant medications however initial approach should include regular sleep-wake schedule, nightly melatonin to help stabilized circadian rhythm, careful attention to the sleeping room to help avoid trauma She should follow-up in the outpatient setting with sleep physician for confirmation.  Patient doing well, if tolerates hemodialysis will transfer to the floor  Ozzie Remmers 03/03/2018  *Care during the described time interval was provided by me and/or other providers on the critical care team.  I have reviewed this patient's available data, including medical history, events of note, physical examination and test results as part of my evaluation. Patient ID: FARHIYA ROSTEN, female   DOB: 27-Aug-1940, 77 y.o.   MRN: [id number] Patient ID: SALSABEEL GORELICK, female   DOB: 1940/11/25, 77 y.o.   MRN: [id number]

## 2018-03-03 NOTE — Progress Notes (Signed)
PHARMACIST - PHYSICIAN COMMUNICATION  CONCERNING: IV to Oral Route Change Policy  RECOMMENDATION: This patient is receiving pantoprazole by the intravenous route.  Based on criteria approved by the Pharmacy and Therapeutics Committee, the intravenous medication(s) is/are being converted to the equivalent oral dose form(s).   DESCRIPTION: These criteria include:  The patient is eating (either orally or via tube) and/or has been taking other orally administered medications for a least 24 hours  The patient has no evidence of active gastrointestinal bleeding or impaired GI absorption (gastrectomy, short bowel, patient on TNA or NPO).  If you have questions about this conversion, please contact the Pharmacy Department  []   [phone] )  [facility] [x]   [phone] )  [facility] []   [phone] )  [facility] []   [phone] )  [facility] []   [phone] )  [place], [facility] 03/03/2018 10:27 AM

## 2018-03-03 NOTE — Progress Notes (Signed)
RN will obtain verbal consent for HD tx and find out from MD if pt is stable enough to come down to HD unit, waiting to hear back from primary RN.    03/03/18 1320  Hand-Off documentation  Report given to (Full Name) Stark Bray  Report received from (Full Name) Delene Ruffini  Vital Signs  Temp 98.8 F (37.1 C)  Pulse Rate 83  Resp 18  Oxygen Therapy  SpO2 100 %

## 2018-03-03 NOTE — Significant Event (Cosign Needed)
Code Blue called by RN pt respiratory arrested than became bradycardic and pulseless. Therefore, ACLS protocol initiated she required 1 amp of epinephrine with ROSC after 2 minutes.  She required mechanical intubation.  I spoke with pts son regarding change in pts condition.  She is currently sedated and vss will continue to monitor and assess pt.  Marda Stalker, [place] Pager [phone] (please enter 7 digits) PCCM Consult Pager [phone] (please enter 7 digits)

## 2018-03-03 NOTE — Progress Notes (Signed)
[place] [facility] [place] NAME: Brianna Coleman    MR#:  [id number]  DATE OF BIRTH:  09-12-1940  SUBJECTIVE:  CHIEF COMPLAINT:   Chief Complaint  Patient presents with  . Cardiac Arrest  Denies any chest pain , right IJ permacath placed on 03/10/2018  very weak but alert.  Son at bedside Weaned off BiPAP REVIEW OF SYSTEMS:  Review of Systems  Constitutional: Negative for chills, fever and weight loss.  HENT: Negative for nosebleeds and sore throat.   Eyes: Negative for blurred vision.  Respiratory: Negative for cough, shortness of breath and wheezing.   Cardiovascular: Negative for chest pain, orthopnea, leg swelling and PND.  Gastrointestinal: Negative for abdominal pain, constipation, diarrhea, heartburn, nausea and vomiting.  Genitourinary: Negative for dysuria and urgency.  Musculoskeletal: Negative for back pain.  Skin: Negative for rash.  Neurological: Negative for dizziness, speech change, focal weakness and headaches.  Endo/Heme/Allergies: Does not bruise/bleed easily.  Psychiatric/Behavioral: Negative for depression.   DRUG ALLERGIES:  No Known Allergies VITALS:  Blood pressure 112/70, pulse 88, temperature 99 F (37.2 C), resp. rate 20, height 5\' 3"  (1.6 m), weight 55.8 kg, SpO2 98 %. PHYSICAL EXAMINATION:  Physical Exam  Constitutional: She is oriented to person, place, and time.  HENT:  Head: Normocephalic and atraumatic.  Eyes: Pupils are equal, round, and reactive to light. Conjunctivae and EOM are normal.  Neck: Normal range of motion. Neck supple. No tracheal deviation present. No thyromegaly present.  Cardiovascular: Normal rate, regular rhythm and normal heart sounds.  Pulmonary/Chest: Effort normal and breath sounds normal. No respiratory distress. She has no wheezes. She exhibits no tenderness.  Abdominal: Soft. Bowel sounds are normal. She exhibits no distension. There is no tenderness.  Musculoskeletal: Normal  range of motion.  Neurological: She is alert and oriented to person, place, and time. No cranial nerve deficit.  Skin: Skin is warm and dry. No rash noted.   LABORATORY PANEL:  Female CBC Recent Labs  Lab 03/01/18 0532  WBC 10.7  HGB 8.7*  HCT 25.9*  PLT 100*   ------------------------------------------------------------------------------------------------------------------ Chemistries  Recent Labs  Lab 02/22/2018 2314  03/05/2018 0829  NA 142   < > 147*  K 3.6   < > 3.7  CL 110   < > 113*  CO2 21*   < > 25  GLUCOSE 202*   < > 120*  BUN 52*   < > 88*  CREATININE 3.47*   < > 4.54*  CALCIUM 8.2*   < > 8.4*  MG 2.0  --   --   AST 82*  --   --   ALT 10  --   --   ALKPHOS 76  --   --   BILITOT 0.6  --   --    < > = values in this interval not displayed.   RADIOLOGY:  Dg Chest Port 1 View  Result Date: 03/03/2018 CLINICAL DATA:  Evaluate for pneumonia. EXAM: PORTABLE CHEST 1 VIEW COMPARISON:  03/04/2018 FINDINGS: Right dialysis catheter in place with the tip in the right atrium. Cardiomegaly with vascular congestion. Very low lung volumes with bibasilar atelectasis and marked gaseous distention of the stomach. No visible effusions or acute bony abnormality. IMPRESSION: Marked gaseous distention of the stomach. Very low lung volumes with bibasilar atelectasis. Cardiomegaly, vascular congestion. Electronically Signed   By: Rolm Baptise M.D.   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 They would like to take her home with home health [name])  All the records are reviewed and case discussed with Care Management/Social Worker. Management plans discussed with the patient, family and they are in agreement.  CODE STATUS: Full Code  TOTAL TIME TAKING CARE OF THIS PATIENT: 35 minutes.   More than 50% of the time was spent in counseling/coordination of care: YES  POSSIBLE D/C IN 2- DAYS, DEPENDING ON CLINICAL CONDITION.   Nicholes Mango M.D on 03/03/2018 at 4:43 PM  Between 7am to 6pm - Pager - [phone]  After 6pm go to [email or web address] - Proofreader  [facility]  Office  [phone]  CC: Primary care physician; [place] Pink, MD  Note: This dictation was prepared with Dragon dictation along with smaller phrase technology. Any transcriptional errors that result from this process are unintentional.

## 2018-03-03 NOTE — Plan of Care (Signed)
Notified MD (Dr. Holley Raring) that  pt is in respiratory distress and unstable at this time. MD ordered to cancel dialysis and states he will revisit the options for dialysis tomorrow.

## 2018-03-04 ENCOUNTER — Inpatient Hospital Stay: Payer: Medicare Other

## 2018-03-04 DIAGNOSIS — Z515 Encounter for palliative care: Secondary | ICD-10-CM

## 2018-03-04 DIAGNOSIS — Z7189 Other specified counseling: Secondary | ICD-10-CM

## 2018-03-04 DIAGNOSIS — N189 Chronic kidney disease, unspecified: Secondary | ICD-10-CM

## 2018-03-04 DIAGNOSIS — N179 Acute kidney failure, unspecified: Secondary | ICD-10-CM

## 2018-03-04 LAB — CULTURE, BLOOD (ROUTINE X 2)
CULTURE: NO GROWTH
CULTURE: NO GROWTH

## 2018-03-04 LAB — CBC
HCT: 22 % — ABNORMAL LOW (ref 35.0–47.0)
Hemoglobin: 7.4 g/dL — ABNORMAL LOW (ref 12.0–16.0)
MCH: 31.9 pg (ref 26.0–34.0)
MCHC: 33.5 g/dL (ref 32.0–36.0)
MCV: 95.4 fL (ref 80.0–100.0)
PLATELETS: 104 10*3/uL — AB (ref 150–440)
RBC: 2.31 MIL/uL — ABNORMAL LOW (ref 3.80–5.20)
RDW: 16.5 % — AB (ref 11.5–14.5)
WBC: 8 10*3/uL (ref 3.6–11.0)

## 2018-03-04 LAB — BLOOD GAS, ARTERIAL
ACID-BASE DEFICIT: 0.1 mmol/L (ref 0.0–2.0)
Bicarbonate: 23.6 mmol/L (ref 20.0–28.0)
FIO2: 0.4
MECHVT: 450 mL
Mechanical Rate: 16
O2 Saturation: 98.1 %
PEEP: 5 cmH2O
PH ART: 7.45 (ref 7.350–7.450)
Patient temperature: 37
pCO2 arterial: 34 mmHg (ref 32.0–48.0)
pO2, Arterial: 102 mmHg (ref 83.0–108.0)

## 2018-03-04 LAB — COMPREHENSIVE METABOLIC PANEL
ALT: 7 U/L (ref 0–44)
AST: 52 U/L — ABNORMAL HIGH (ref 15–41)
Albumin: 2.8 g/dL — ABNORMAL LOW (ref 3.5–5.0)
Alkaline Phosphatase: 74 U/L (ref 38–126)
Anion gap: 7 (ref 5–15)
BUN: 97 mg/dL — ABNORMAL HIGH (ref 8–23)
CHLORIDE: 112 mmol/L — AB (ref 98–111)
CO2: 26 mmol/L (ref 22–32)
CREATININE: 4.31 mg/dL — AB (ref 0.44–1.00)
Calcium: 8.3 mg/dL — ABNORMAL LOW (ref 8.9–10.3)
GFR, EST AFRICAN AMERICAN: 10 mL/min — AB (ref >60)
GFR, EST NON AFRICAN AMERICAN: 9 mL/min — AB (ref >60)
Glucose, Bld: 130 mg/dL — ABNORMAL HIGH (ref 70–99)
POTASSIUM: 3.6 mmol/L (ref 3.5–5.1)
Sodium: 145 mmol/L (ref 135–145)
Total Bilirubin: 1 mg/dL (ref 0.3–1.2)
Total Protein: 5.4 g/dL — ABNORMAL LOW (ref 6.5–8.1)

## 2018-03-04 LAB — GLUCOSE, CAPILLARY
GLUCOSE-CAPILLARY: 112 mg/dL — AB (ref 70–99)
GLUCOSE-CAPILLARY: 84 mg/dL (ref 70–99)
Glucose-Capillary: 98 mg/dL (ref 70–99)

## 2018-03-04 LAB — PHOSPHORUS: Phosphorus: 3.5 mg/dL (ref 2.5–4.6)

## 2018-03-04 LAB — TROPONIN I
TROPONIN I: 0.71 ng/mL — AB (ref <0.03)
Troponin I: 0.82 ng/mL (ref <0.03)

## 2018-03-04 LAB — HEMOGLOBIN: HEMOGLOBIN: 8.6 g/dL — AB (ref 12.0–16.0)

## 2018-03-04 LAB — MAGNESIUM: MAGNESIUM: 2.7 mg/dL — AB (ref 1.7–2.4)

## 2018-03-04 LAB — TRIGLYCERIDES: Triglycerides: 111 mg/dL (ref <150)

## 2018-03-04 MED ORDER — ATROPINE SULFATE 1 MG/10ML IJ SOSY
PREFILLED_SYRINGE | INTRAMUSCULAR | Status: AC
  Administered 2018-03-04: 1 mg
  Filled 2018-03-04: qty 10

## 2018-03-04 MED ORDER — CHLORHEXIDINE GLUCONATE 0.12% ORAL RINSE (MEDLINE KIT)
15.0000 mL | Freq: Two times a day (BID) | OROMUCOSAL | Status: DC
  Administered 2018-03-04 – 2018-03-05 (×3): 15 mL via OROMUCOSAL

## 2018-03-04 MED ORDER — FENTANYL CITRATE (PF) 100 MCG/2ML IJ SOLN
100.0000 ug | Freq: Once | INTRAMUSCULAR | Status: AC
  Administered 2018-03-03: 100 ug via INTRAVENOUS

## 2018-03-04 MED ORDER — HYDRALAZINE HCL 20 MG/ML IJ SOLN: 10.0000 mg | INTRAMUSCULAR | Status: DC | PRN

## 2018-03-04 MED ORDER — DOPAMINE-DEXTROSE 3.2-5 MG/ML-% IV SOLN
0.0000 ug/kg/min | INTRAVENOUS | Status: DC
  Administered 2018-03-04: 1 ug/kg/min via INTRAVENOUS
  Administered 2018-03-08: 3 ug/kg/min via INTRAVENOUS
  Filled 2018-03-04 (×2): qty 250

## 2018-03-04 MED ORDER — PANTOPRAZOLE SODIUM 40 MG IV SOLR
40.0000 mg | Freq: Two times a day (BID) | INTRAVENOUS | Status: DC
  Administered 2018-03-04 – 2018-03-10 (×14): 40 mg via INTRAVENOUS
  Filled 2018-03-04 (×14): qty 40

## 2018-03-04 MED ORDER — ATROPINE SULFATE 1 MG/10ML IJ SOSY
1.0000 mg | PREFILLED_SYRINGE | Freq: Once | INTRAMUSCULAR | Status: AC
  Administered 2018-03-04: 1 mg via INTRAVENOUS

## 2018-03-04 MED ORDER — ORAL CARE MOUTH RINSE
15.0000 mL | OROMUCOSAL | Status: DC
  Administered 2018-03-04 – 2018-03-05 (×15): 15 mL via OROMUCOSAL

## 2018-03-04 MED ORDER — DEXTROSE-NACL 5-0.45 % IV SOLN
INTRAVENOUS | Status: DC
  Administered 2018-03-04 – 2018-03-08 (×5): via INTRAVENOUS

## 2018-03-04 MED ORDER — SODIUM CHLORIDE 0.9 % IV SOLN
0.0000 ug/min | INTRAVENOUS | Status: DC
  Administered 2018-03-04: 5 ug/min via INTRAVENOUS
  Filled 2018-03-04: qty 4

## 2018-03-04 MED ORDER — SODIUM CHLORIDE 0.9 % IV SOLN
0.0000 ug/min | INTRAVENOUS | Status: DC
  Filled 2018-03-04: qty 4

## 2018-03-04 MED FILL — Medication: Qty: 1 | Status: AC

## 2018-03-04 NOTE — Progress Notes (Signed)
Hemodialysis started. HR dropped from 70s to upper 40s and sustaining. BP currently 142/57 with a MAP of 74. Dopamine infusing at 7 mcg/hr. ICU MD notified. ICU MD instructed to closely monitor that patient's HR and titrate the dopamine up as needed. Will continue to monitor.

## 2018-03-04 NOTE — Progress Notes (Signed)
   03/04/18 1100  Clinical Encounter Type  Visited With Family  Visit Type Follow-up   Chaplain followed up with family.  No needs at present.  Family would be interested in a visit this evening.  Unit chaplain will relay request to on-call chaplain.

## 2018-03-04 NOTE — Progress Notes (Signed)
Nutrition Follow Up Note   DOCUMENTATION CODES:   Severe malnutrition in context of chronic illness  INTERVENTION:   If tube feeds initiated recommend vital HP @ 89ml/hr  Free water flushes 71ml q 4 hrs   Propofol: 6.7 ml/hr- provides 177kcal/day   Regimen provides 1257kcal/day, 94g/day protein, 1076ml/day free water   Recommend MVI daily via tube   Recommend Ocuvite daily via tube  Recommend B-Complex with C daily via tube   NUTRITION DIAGNOSIS:   Severe Malnutrition related to chronic illness(CKD stage IV, large hiatal hernia) as evidenced by moderate fat depletion, severe fat depletion, moderate muscle depletion, severe muscle depletion.  GOAL:   Provide needs based on ASPEN/SCCM guidelines  MONITOR:   Labs, Weight trends, Skin, I & O's, Vent status  ASSESSMENT:   77 year old female with PMHx of Parkinson's disease, dyslipidemia, HTN, arthritis, GERD, RLS, neurogenic bladder, dysphagia, memory disturbance, gait disorder, hx CVA, aortic stenosis, CKD stage IV, hiatal hernia who is admitted after cardiac arrest, acute pulmonary edema, N/V, possible aspiration, abdominal pain, acute on chronic renal failure, also with large hiatal hernia.   Pt s/p cardiac arrest yesterday; now sedated and ventilated. OGT in place; no plans for tube feeds today. Pt to initiate HD today. Plans are for possible extubation tomorrow. Palliative care following for GOC.    Medications reviewed and include: aspirin, plavix, heparin, protonix, unasyn, dopamine, fentanyl, neo-synephrine, propofol  Labs reviewed: BUN 97(H), creat 4.31(H), Ca 8.3(L), P 3.5 wnl, Mg 2.7(H) Hgb 7.4(L), Hct 22.0(L)  Patient is currently intubated on ventilator support MV: 7.2 L/min Temp (24hrs), Avg:98.3 F (36.8 C), Min:96.6 F (35.9 C), Max:99.5 F (37.5 C)  Propofol: 6.7 ml/hr- provides 177kcal/day   MAP- 64-74 mmHg  Diet Order:   Diet Order    None     EDUCATION NEEDS:   Education needs have been  addressed  Skin:  Skin Assessment: Reviewed RN Assessment  Last BM:  02/26/2018  Height:   Ht Readings from Last 1 Encounters:  03/20/2018 5\' 3"  (1.6 m)    Weight:   Wt Readings from Last 1 Encounters:  03/03/18 55.8 kg    Ideal Body Weight:  52.3 kg  BMI:  Body mass index is 21.79 kg/m.  Estimated Nutritional Needs:   Kcal:  1246kcal/day   Protein:  84-94g/day   Fluid:  1.4-1.6L/day   Koleen Distance MS, RD, LDN Pager #- [phone] Office#- [phone] After Hours Pager: [phone]

## 2018-03-04 NOTE — Progress Notes (Signed)
This note also relates to the following rows which could not be included: Pulse Rate - Cannot attach notes to unvalidated device data Resp - Cannot attach notes to unvalidated device data BP - Cannot attach notes to unvalidated device data SpO2 - Cannot attach notes to unvalidated device data End Tidal CO2 (EtCO2) - Cannot attach notes to unvalidated device data  Hd started

## 2018-03-04 NOTE — Progress Notes (Signed)
Dopamine restarted as patients blood pressure dropped. Patients heart rate dropped down to 39 when dopamine restarted.

## 2018-03-04 NOTE — Progress Notes (Signed)
[facility]  ROUNDING NOTE   Subjective:  Patient worse this a.m. She apparently was on BiPAP yesterday evening and took the mask off. Thereafter she had a respiratory and cardiac arrest. Currently on the ventilator. Given instability dialysis was held overnight.   Objective:  Vital signs in last 24 hours:  Temp:  [96.6 F (35.9 C)-99.5 F (37.5 C)] 96.8 F (36 C) (09/13 0400) Pulse Rate:  [35-136] 73 (09/13 0530) Resp:  [0-28] 16 (09/13 0530) BP: (61-236)/(43-139) 110/58 (09/13 0530) SpO2:  [60 %-100 %] 100 % (09/13 0530) FiO2 (%):  [40 %-70 %] 40 % (09/13 0739)  Weight change:  Filed Weights   02/21/2018 0349 02/26/2018 1338 03/03/18 0500  Weight: 56.2 kg 56.2 kg 55.8 kg    Intake/Output: I/O last 3 completed shifts: In: 721.7 [P.O.:240; I.V.:141.6; NG/GT:240; IV Piggyback:100.1] Out: 1205 [Urine:1205]   Intake/Output this shift:  No intake/output data recorded.  Physical Exam: General:  Critically ill-appearing  Head: Normocephalic, atraumatic.  ETT in place  Eyes: Anicteric  Neck: Supple, trachea midline  Lungs:   Scattered rhonchi, vent assisted  Heart: S1S2 no rubs  Abdomen:  Soft, nontender, bowel sounds present  Extremities: trace peripheral edema.  Neurologic: Intubated, sedated  Skin: No lesions  Access:  R IJ permcath    Basic Metabolic Panel: Recent Labs  Lab 02/20/2018 1841 03/19/2018 2314 03/01/18 0532 02/28/2018 0829 03/03/18 2336  NA 139 142 144 147* 145  K 5.0 3.6 3.7 3.7 3.6  CL 108 110 112* 113* 112*  CO2 14* 21* 23 25 26   GLUCOSE 340* 202* 126* 120* 130*  BUN 45* 52* 70* 88* 97*  CREATININE 3.57* 3.47* 4.13* 4.54* 4.31*  CALCIUM 8.9 8.2* 8.1* 8.4* 8.3*  MG  --  2.0  --   --  2.7*  PHOS  --  4.1  --  5.1* 3.5    Liver Function Tests: Recent Labs  Lab 03/18/2018 1841 02/20/2018 2314 03/06/2018 0829 03/03/18 2336  AST 31 82*  --  52*  ALT 5 10  --  7  ALKPHOS 81 76  --  74  BILITOT 0.7 0.6  --  1.0  PROT 6.3* 6.2*  --   5.4*  ALBUMIN 3.7 3.4* 3.0* 2.8*   Recent Labs  Lab 03/11/2018 1841  LIPASE 85*   No results for input(s): AMMONIA in the last 168 hours.  CBC: Recent Labs  Lab 03/13/2018 1841 03/01/18 0532 03/03/18 2336 03/04/18 0353  WBC 9.7 10.7 9.1 8.0  NEUTROABS 4.9 9.7* 8.1*  --   HGB 10.6* 8.7* 8.0* 7.4*  HCT 33.3* 25.9* 24.3* 22.0*  MCV 100.5* 95.9 95.5 95.4  PLT 134* 100* 111* 104*    Cardiac Enzymes: Recent Labs  Lab 03/14/2018 2314 02/28/18 0446 02/28/18 1057 03/01/18 0532 03/03/18 2336  TROPONINI 0.76* 3.57* 4.43* 2.57* 0.82*    BNP: Invalid input(s): POCBNP  CBG: Recent Labs  Lab 03/01/2018 2232 03/01/18 1509 02/25/2018 0738  GLUCAP 207* 122* 101*    Microbiology: Results for orders placed or performed during the hospital encounter of 03/17/2018  Blood culture (routine x 2)     Status: None   Collection Time: 03/09/2018  7:54 PM  Result Value Ref Range Status   Specimen Description BLOOD RIGHT WRIST  Final   Special Requests   Final    BOTTLES DRAWN AEROBIC AND ANAEROBIC Blood Culture results may not be optimal due to an excessive volume of blood received in culture bottles   Culture  Final    NO GROWTH 5 DAYS Performed at [facility], [address] [place], [place] [address]    Report Status 03/04/2018 FINAL  Final  MRSA PCR Screening     Status: None   Collection Time: 02/26/2018 10:39 PM  Result Value Ref Range Status   MRSA by PCR NEGATIVE NEGATIVE Final    Comment:        The GeneXpert MRSA Assay (FDA approved for NASAL specimens only), is one component of a comprehensive MRSA colonization surveillance program. It is not intended to diagnose MRSA infection nor to guide or monitor treatment for MRSA infections. Performed at [facility], [address] [place], [place] [address]   Blood culture (routine x 2)     Status: None   Collection Time: 03/04/2018 11:12 PM  Result Value Ref Range Status   Specimen Description BLOOD LEFT  ARM  Final   Special Requests   Final    BOTTLES DRAWN AEROBIC AND ANAEROBIC Blood Culture results may not be optimal due to an inadequate volume of blood received in culture bottles   Culture   Final    NO GROWTH 5 DAYS Performed at [facility], [address] [place], [place] [address]    Report Status 03/04/2018 FINAL  Final    Coagulation Studies: No results for input(s): LABPROT, INR in the last 72 hours.  Urinalysis: No results for input(s): COLORURINE, LABSPEC, PHURINE, GLUCOSEU, HGBUR, BILIRUBINUR, KETONESUR, PROTEINUR, UROBILINOGEN, NITRITE, LEUKOCYTESUR in the last 72 hours.  Invalid input(s): APPERANCEUR    Imaging: Dg Abd 1 View  Result Date: 03/03/2018 CLINICAL DATA:  OG placement EXAM: ABDOMEN - 1 VIEW COMPARISON:  02/28/2018 FINDINGS: A large hiatal hernia is noted. The esophageal tube tip is in the left upper quadrant and projects over the stomach. Pleural effusions and basilar airspace disease. Cardiomegaly. IMPRESSION: Esophageal tube tip projects below the diaphragm and is presumably within the gastric body Electronically Signed   By: Donavan Foil M.D.   On: 03/03/2018 23:11   Dg Chest Port 1 View  Result Date: 03/03/2018 CLINICAL DATA:  Intubated EXAM: PORTABLE CHEST 1 VIEW COMPARISON:  03/03/2018, 03/03/2018, 12/18/2016 FINDINGS: Endotracheal tube tip is about 9 mm superior to the carina. Esophageal tube tip projects over the stomach, a large hiatal hernia is noted with decreased gastric distention. Right-sided central venous catheter tip over the right atrium. Cardiomegaly with vascular congestion, pleural effusion and pulmonary edema. Bibasilar consolidation. No pneumothorax. IMPRESSION: 1. Endotracheal tube tip about 9 mm superior to carina 2. Cardiomegaly with worsened vascular congestion and pulmonary edema. Small pleural effusions with continued basilar consolidations. 3. Decreased gaseous dilatation of large hiatal hernia. Electronically Signed   By:  Donavan Foil M.D.   On: 03/03/2018 23:13   Dg Chest Port 1 View  Result Date: 03/03/2018 CLINICAL DATA:  Evaluate for pneumonia. EXAM: PORTABLE CHEST 1 VIEW COMPARISON:  03/15/2018 FINDINGS: Right dialysis catheter in place with the tip in the right atrium. Cardiomegaly with vascular congestion. Very low lung volumes with bibasilar atelectasis and marked gaseous distention of the stomach. No visible effusions or acute bony abnormality. IMPRESSION: Marked gaseous distention of the stomach. Very low lung volumes with bibasilar atelectasis. Cardiomegaly, vascular congestion. Electronically Signed   By: Rolm Baptise M.D.   On: 03/03/2018 10:48     Medications:   . sodium chloride Stopped (03/01/18 0031)  . sodium chloride    . sodium chloride    . ampicillin-sulbactam (UNASYN) IV Stopped (03/03/18  1808)  . DOPamine 2 mcg/kg/min (03/04/18 0500)  . fentaNYL infusion INTRAVENOUS 100 mcg/hr (03/04/18 0500)  . phenylephrine (NEO-SYNEPHRINE) Adult infusion Stopped (03/04/18 0340)  . propofol (DIPRIVAN) infusion 25 mcg/kg/min (03/04/18 0500)   . aspirin  81 mg Per Tube Daily  . atorvastatin  80 mg Per Tube QPM  . carbidopa-levodopa  1 tablet Per Tube QID  . chlorhexidine  15 mL Mouth Rinse BID  . chlorhexidine gluconate (MEDLINE KIT)  15 mL Mouth Rinse BID  . Chlorhexidine Gluconate Cloth  6 each Topical Q0600  . clopidogrel  75 mg Per Tube Daily  . heparin injection (subcutaneous)  5,000 Units Subcutaneous Q8H  . Influenza vac split quadrivalent PF  0.5 mL Intramuscular Tomorrow-1000  . mouth rinse  15 mL Mouth Rinse 10 times per day  . pantoprazole sodium  40 mg Per Tube Daily  . pramipexole  1 mg Per Tube TID  . tuberculin  5 Units Intradermal Once   sodium chloride, sodium chloride, sodium chloride, acetaminophen, alteplase, docusate, fentaNYL, heparin, hydrALAZINE, ipratropium-albuterol, ketorolac, lidocaine (PF), lidocaine-prilocaine, pentafluoroprop-tetrafluoroeth  Assessment/ Plan:   77 y.o. female with past medical history of hypertension, Parkinson's disorder, GI bleed, overactive bladder, iron deficiency anemia, hyperlipidemia, chronic musculoskeletal low back pain, chronic lower extremity edema, GERD, right basal ganglia CVA and vitamin D deficiency admitted with cardiac arrest.   1.  Acute renal failure/chronic kidney disease stage IV baseline EGFR 16.  Suspect that her acute renal failure now attributable to recent cardiac arrest.   -Overall renal function remains quite low.  EGFR currently 9.  Patient had cardiac arrest overnight and therefore we held her first dialysis.  We will plan for hemodialysis this a.m.  2.  Anemia of chronic kidney disease.  Hemoglobin has declined to 7.4.  Consider blood transfusion for hemoglobin of 7 or less.  3.  Secondary hyperparathyroidism.  Continue to monitor bone mineral metabolism parameters.  4.  Acute respiratory failure/cardiac arrest.  The patient had a short-lived cardiac arrest yesterday.  She is currently on the ventilator.  Ventilatory support as per pulmonary/critical care.   LOS: 5 Coden Franchi 9/13/20197:59 AM

## 2018-03-04 NOTE — Progress Notes (Signed)
Follow up - Critical Care Medicine Note  Patient Details:    Brianna Coleman is an 77 y.o. female.Severe Parkinson's, orthostatic hypertension, REM behavior disorder, hyperlipidemia, aortic stenosis, hypertension, CVA,status post episode of epigastric pain, brief cardiac arrest and respiratory distress.   Lines, Airways, Drains: Airway 7.5 mm (Active)  Secured at (cm) 20 cm 03/04/2018  7:39 AM  Measured From Lips 03/04/2018  7:39 AM  Secured Location Center 03/04/2018  4:08 AM  Secured By [employer] 03/04/2018  7:39 AM  Tube Holder Repositioned Yes 03/04/2018  4:08 AM  Cuff Pressure (cm H2O) 26 cm H2O 03/04/2018  4:08 AM  Site Condition Dry 03/04/2018  7:39 AM     CVC Triple Lumen 03/03/18 Left Femoral (Active)  Indication for Insertion or Continuance of Line Prolonged intravenous therapies;Vasoactive infusions 03/04/2018  8:00 AM  Site Assessment Intact;Dry;Clean 03/03/2018 11:30 PM  Proximal Lumen Status Blood return noted;Infusing;Flushed 03/03/2018 11:30 PM  Medial Lumen Status Infusing;Flushed;Blood return noted 03/03/2018 11:30 PM  Distal Lumen Status Flushed;Capped (Central line);Blood return noted 03/03/2018 11:30 PM  Dressing Type Transparent;Occlusive 03/03/2018 11:30 PM  Dressing Status Clean;Intact;Antimicrobial disc in place;Dry 03/03/2018 11:30 PM  Line Care Connections checked and tightened 03/03/2018 11:30 PM  Dressing Change Due 03/10/18 03/03/2018 11:30 PM     NG/OG Tube Orogastric 16 Fr. Center mouth Xray Documented cm marking at nare/ corner of mouth (Active)  External Length of Tube (cm) - (if applicable) 56 cm 02/01/7516 12:00 AM  Site Assessment Dry;Intact;Clean 03/04/2018 12:00 AM  Ongoing Placement Verification No change in respiratory status;No change in cm markings or external length of tube from initial placement;No acute changes, not attributed to clinical condition 03/04/2018 12:00 AM  Status Clamped 03/04/2018 12:00 AM     Urethral Catheter Megan RN Non-latex  14 Fr. (Active)  Indication for Insertion or Continuance of Catheter Unstable critical patients (first 24-48 hours) 03/04/2018  7:32 AM  Site Assessment Clean;Intact;Dry 03/04/2018 12:00 AM  Catheter Maintenance Bag below level of bladder;Insertion date on drainage bag 03/04/2018  8:00 AM  Collection Container Standard drainage bag 03/04/2018 12:00 AM  Securement Method Leg strap 03/04/2018 12:00 AM  Urinary Catheter Interventions Unclamped 03/04/2018 12:00 AM  Output (mL) 200 mL 03/04/2018  8:39 AM    Anti-infectives:  Anti-infectives (From admission, onward)   Start     Dose/Rate Route Frequency Ordered Stop   03/03/18 0000  ceFAZolin (ANCEF) IVPB 1 g/50 mL premix  Status:  Discontinued     1 g 100 mL/hr over 30 Minutes Intravenous On call 03/09/2018 1400 03/19/2018 2331   03/11/2018 1400  ceFAZolin (ANCEF) IVPB 1 g/50 mL premix     1 g 100 mL/hr over 30 Minutes Intravenous  Once 03/14/2018 1351 03/18/2018 1515   02/28/18 1800  Ampicillin-Sulbactam (UNASYN) 3 g in sodium chloride 0.9 % 100 mL IVPB     3 g 200 mL/hr over 30 Minutes Intravenous Every 24 hours 02/28/18 1140 03/07/18 1759   02/28/18 0200  piperacillin-tazobactam (ZOSYN) IVPB 3.375 g  Status:  Discontinued     3.375 g 12.5 mL/hr over 240 Minutes Intravenous Every 12 hours 03/11/2018 2349 02/28/18 1008   03/14/2018 1930  ceFEPIme (MAXIPIME) 1 g in sodium chloride 0.9 % 100 mL IVPB     1 g 200 mL/hr over 30 Minutes Intravenous  Once 02/22/2018 1926 03/10/2018 2001      Microbiology: Results for orders placed or performed during the hospital encounter of 03/02/2018  Blood culture (routine x 2)  Status: None   Collection Time: 03/15/2018  7:54 PM  Result Value Ref Range Status   Specimen Description BLOOD RIGHT WRIST  Final   Special Requests   Final    BOTTLES DRAWN AEROBIC AND ANAEROBIC Blood Culture results may not be optimal due to an excessive volume of blood received in culture bottles   Culture   Final    NO GROWTH 5 DAYS Performed at  [facility], [address] [place], [place] [address]    Report Status 03/04/2018 FINAL  Final  MRSA PCR Screening     Status: None   Collection Time: 02/26/2018 10:39 PM  Result Value Ref Range Status   MRSA by PCR NEGATIVE NEGATIVE Final    Comment:        The GeneXpert MRSA Assay (FDA approved for NASAL specimens only), is one component of a comprehensive MRSA colonization surveillance program. It is not intended to diagnose MRSA infection nor to guide or monitor treatment for MRSA infections. Performed at [facility], [address] [place], [place] [address]   Blood culture (routine x 2)     Status: None   Collection Time: 03/06/2018 11:12 PM  Result Value Ref Range Status   Specimen Description BLOOD LEFT ARM  Final   Special Requests   Final    BOTTLES DRAWN AEROBIC AND ANAEROBIC Blood Culture results may not be optimal due to an inadequate volume of blood received in culture bottles   Culture   Final    NO GROWTH 5 DAYS Performed at [facility], [address], [place], [place] [address]    Report Status 03/04/2018 FINAL  Final    Studies: Dg Abd 1 View  Result Date: 03/03/2018 CLINICAL DATA:  OG placement EXAM: ABDOMEN - 1 VIEW COMPARISON:  02/28/2018 FINDINGS: A large hiatal hernia is noted. The esophageal tube tip is in the left upper quadrant and projects over the stomach. Pleural effusions and basilar airspace disease. Cardiomegaly. IMPRESSION: Esophageal tube tip projects below the diaphragm and is presumably within the gastric body Electronically Signed   By: Donavan Foil M.D.   On: 03/03/2018 23:11   Dg Abd 1 View  Result Date: 02/28/2018 CLINICAL DATA:  Abdominal pain EXAM: ABDOMEN - 1 VIEW COMPARISON:  None. FINDINGS: Gaseous distention of the stomach and small bowel. No dilated small bowel is visualized. Incompletely visualized cardiomegaly. IMPRESSION: Gas distended stomach. No radiographic evidence of small-bowel obstruction.  Electronically Signed   By: Ulyses Jarred M.D.   On: 02/28/2018 00:11   Ct Head Wo Contrast  Result Date: 03/11/2018 CLINICAL DATA:  Altered level of consciousness, unexplained. EXAM: CT HEAD WITHOUT CONTRAST TECHNIQUE: Contiguous axial images were obtained from the base of the skull through the vertex without intravenous contrast. COMPARISON:  Head CT 11/30/2017 FINDINGS: Brain: No intracranial hemorrhage, mass effect, or midline shift. Unchanged atrophy and chronic small vessel ischemia. Unchanged remote lacunar infarcts in the right greater than left basal ganglia. Small remote left parietal infarct again seen. No cerebral edema. No hydrocephalus. The basilar cisterns are patent. No evidence of territorial infarct or acute ischemia. No extra-axial or intracranial fluid collection. Vascular: Atherosclerosis of skullbase vasculature without hyperdense vessel or abnormal calcification. Skull: No fracture or focal lesion. Sinuses/Orbits: Paranasal sinuses and mastoid air cells are clear. The visualized orbits are unremarkable. Other: None. IMPRESSION: 1.  No acute intracranial abnormality. 2. Unchanged atrophy, chronic small vessel ischemia, and remote lacunar infarcts in the basal ganglia. Electronically Signed  By: Keith Rake M.D.   On: 03/08/2018 21:08   Dg Chest Port 1 View  Result Date: 03/04/2018 CLINICAL DATA:  Acute respiratory failure EXAM: PORTABLE CHEST 1 VIEW COMPARISON:  03/03/2018 FINDINGS: Endotracheal tube, nasogastric catheter and dialysis catheter are again identified and stable. The nasogastric catheter is looped but remains within the stomach in a large hiatal hernia. Cardiac enlargement is again seen and stable. Bibasilar consolidation is again identified and stable. No new focal abnormality is noted. IMPRESSION: No significant change from the previous day. Bibasilar consolidation is again seen. Electronically Signed   By: Inez Catalina M.D.   On: 03/04/2018 08:47   Dg Chest Port 1  View  Result Date: 03/03/2018 CLINICAL DATA:  Intubated EXAM: PORTABLE CHEST 1 VIEW COMPARISON:  03/03/2018, 03/16/2018, 12/18/2016 FINDINGS: Endotracheal tube tip is about 9 mm superior to the carina. Esophageal tube tip projects over the stomach, a large hiatal hernia is noted with decreased gastric distention. Right-sided central venous catheter tip over the right atrium. Cardiomegaly with vascular congestion, pleural effusion and pulmonary edema. Bibasilar consolidation. No pneumothorax. IMPRESSION: 1. Endotracheal tube tip about 9 mm superior to carina 2. Cardiomegaly with worsened vascular congestion and pulmonary edema. Small pleural effusions with continued basilar consolidations. 3. Decreased gaseous dilatation of large hiatal hernia. Electronically Signed   By: Donavan Foil M.D.   On: 03/03/2018 23:13   Dg Chest Port 1 View  Result Date: 03/03/2018 CLINICAL DATA:  Evaluate for pneumonia. EXAM: PORTABLE CHEST 1 VIEW COMPARISON:  03/10/2018 FINDINGS: Right dialysis catheter in place with the tip in the right atrium. Cardiomegaly with vascular congestion. Very low lung volumes with bibasilar atelectasis and marked gaseous distention of the stomach. No visible effusions or acute bony abnormality. IMPRESSION: Marked gaseous distention of the stomach. Very low lung volumes with bibasilar atelectasis. Cardiomegaly, vascular congestion. Electronically Signed   By: Rolm Baptise M.D.   On: 03/03/2018 10:48   Dg Chest Port 1 View  Result Date: 02/21/2018 CLINICAL DATA:  Cardiac arrest. EXAM: PORTABLE CHEST 1 VIEW COMPARISON:  Radiograph earlier this day. FINDINGS: Cardiac enlargement partially obscured by large retrocardiac hiatal hernia. Slight decreased gaseous gastric distension from prior. Aortic atherosclerosis. Mild vascular congestion without overt pulmonary edema. Probable bibasilar atelectasis with possible small pleural effusions. Improved linear right lung base atelectasis from prior. No  pneumothorax. IMPRESSION: 1. Probable bibasilar atelectasis and possible pleural effusions. Vascular congestion. 2. Cardiac enlargement, partially obscured by large hiatal hernia. Slight decreased gaseous gastric distension from prior. Electronically Signed   By: Keith Rake M.D.   On: 02/20/2018 23:22   Dg Chest Portable 1 View  Result Date: 02/21/2018 CLINICAL DATA:  Respiratory distress. EXAM: PORTABLE CHEST 1 VIEW COMPARISON:  12/18/2016. FINDINGS: Stable enlarged cardiac silhouette. Interval gaseous distention of the stomach, including a large hiatal hernia. Interval linear atelectasis at the right lung base. Clear left lung. Diffuse osteopenia. IMPRESSION: 1. Interval gaseous distention of the stomach, including a large hiatal hernia. 2. Interval linear atelectasis at the right lung base. Electronically Signed   By: Claudie Revering M.D.   On: 03/01/2018 19:18   Dg Abd Portable 1v  Result Date: 02/28/2018 CLINICAL DATA:  Nasogastric tube placement EXAM: PORTABLE ABDOMEN - 1 VIEW COMPARISON:  Portable exam 0138 hours compared to 02/21/2018 and correlated with prior CT abdomen and pelvis exam of 12/18/2016 FINDINGS: Nasogastric tube coiled in a large hiatal hernia at the lower LEFT hemithorax. Gaseous distention of the distal stomach below the diaphragm. Air-filled loops of  nondistended bowel throughout abdomen and pelvis. Bones demineralized. External pacing leads noted. IMPRESSION: Tip of nasogastric tube is coiled within a large hiatal hernia at the inferior LEFT chest. Electronically Signed   By: Lavonia Dana M.D.   On: 02/28/2018 02:00    Consults: Treatment Team:  Hermelinda Dellen, DO Corey Skains, MD Anthonette Legato, MD Schnier, Dolores Lory, MD   Subjective:    Overnight Issues: Unfortunately last evening patient suffered a respiratory and then subsequent cardiac arrest, she required intubation, Central line placementis on dopamine, fentanyl and propofol  Objective:  Vital signs for  last 24 hours: Temp:  [96.6 F (35.9 C)-99.5 F (37.5 C)] 97.3 F (36.3 C) (09/13 0800) Pulse Rate:  [35-136] 64 (09/13 0800) Resp:  [0-28] 16 (09/13 0800) BP: (61-236)/(36-139) 127/36 (09/13 0800) SpO2:  [60 %-100 %] 100 % (09/13 0800) FiO2 (%):  [40 %-70 %] 40 % (09/13 0739)  Hemodynamic parameters for last 24 hours:    Intake/Output from previous day: 09/12 0701 - 09/13 0700 In: 721.6 [P.O.:240; I.V.:141.6; NG/GT:240; IV Piggyback:100] Out: 915 [Urine:915]  Intake/Output this shift: Total I/O In: 74.5 [I.V.:74.5] Out: 200 [Urine:200]  Vent settings for last 24 hours: Vent Mode: PRVC FiO2 (%):  [40 %-70 %] 40 % Set Rate:  [16 bmp] 16 bmp Vt Set:  [450 mL] 450 mL PEEP:  [5 cmH20] 5 cmH20  Physical Exam:  Vital signs: Please see the above listed vital signs HEENT: Patient is orally intubated, orogastric tube in place, central line in place, trachea midline, no thyromegaly appreciated Cardiovascular: Regular rhythm, has had episodes of bradycardia Pulmonary: Coarse rhonchi appreciated Abdominal: Positive bowel sounds, soft exam Extremities: No clubbing cyanosis or edema noted Neurologic: Patient is sedated limited exam PermCath noted in place  Assessment/Plan:   Status post respiratory and cardiac arrest now 2 requiring intubation, mechanical ventilation. Patient is on Unasyn for empiric aspiration. Chest x-ray reveals right lower lung field airspace disease, large hiatal hernia, endotracheal tube, PermCath and oral gastric tube in place. We'll send off respiratory culture.We'll hold on breathing trial today and begin tomorrow  Shock. She has had relative bradycardia and hypotension, weaning off of dopamine. Some of this may be medication/sedation related  End-stage renal disease, on hemodialysis, per nephrology pending hemodialysis today. BUN 97/creatinine 4.31, potassium 3.6, CO2 of 26  Anemia. No evidence of active bleeding. Hemoglobin this morning is 7.4. Will  recheck may require transfusion  Parkinson's with dementia, orthostatic hypotension  Had discussion with family today. Wish [place] to continue aggressive care and readdress goals of care if unable to improve  Critical care time 40 minutes  Guido Comp 03/04/2018  *Care during the described time interval was provided by me and/or other providers on the critical care team.  I have reviewed this patient's available data, including medical history, events of note, physical examination and test results as part of my evaluation.

## 2018-03-04 NOTE — Progress Notes (Signed)
[place] [facility] [place] NAME: Dyna Figuereo    MR#:  [id number]  DATE OF BIRTH:  1940/08/30  SUBJECTIVE:  CHIEF COMPLAINT: Patient had acute respiratory distress last night got intubated.  Had hemodialysis today.  Currently on fentanyl and propofol dopamine drip getting weaned off right IJ permacath placed on 03/08/2018  very weak but alert.  Daughter at bedside REVIEW OF SYSTEMS:  Review of Systems  Unable to perform ROS: Acuity of condition  Constitutional: Negative for chills, fever and weight loss.  HENT: Negative for nosebleeds and sore throat.   Eyes: Negative for blurred vision.  Respiratory: Negative for cough, shortness of breath and wheezing.   Cardiovascular: Negative for chest pain, orthopnea, leg swelling and PND.  Gastrointestinal: Negative for abdominal pain, constipation, diarrhea, heartburn, nausea and vomiting.  Genitourinary: Negative for dysuria and urgency.  Musculoskeletal: Negative for back pain.  Skin: Negative for rash.  Neurological: Negative for dizziness, speech change, focal weakness and headaches.  Endo/Heme/Allergies: Does not bruise/bleed easily.  Psychiatric/Behavioral: Negative for depression.   DRUG ALLERGIES:   Allergies  Allergen Reactions  . Levophed [Norepinephrine Bitartrate] Other (See Comments)    Pt with significant bradycardia.  Also EKG changes with inverted P wave and T wave morphology change   VITALS:  Blood pressure 136/60, pulse 74, temperature 98.2 F (36.8 C), temperature source Bladder, resp. rate 16, height 5\' 3"  (1.6 m), weight 55.8 kg, SpO2 100 %. PHYSICAL EXAMINATION:  Physical Exam  HENT:  Head: Normocephalic.  Eyes: Pupils are equal, round, and reactive to light. Conjunctivae are normal.  Neck: No tracheal deviation present. No thyromegaly present.  Cardiovascular: Normal rate, regular rhythm and normal heart sounds.  Pulmonary/Chest: No respiratory distress. She has no  wheezes. She exhibits no tenderness.  ET tube intact, on ventilator  Abdominal: Soft. Bowel sounds are normal. She exhibits no distension. There is no tenderness.  Musculoskeletal: Normal range of motion.  Neurological: No cranial nerve deficit.  Skin: Skin is warm and dry. No rash noted.   LABORATORY PANEL:  Female CBC Recent Labs  Lab 03/04/18 0353 03/04/18 1319  WBC 8.0  --   HGB 7.4* 8.6*  HCT 22.0*  --   PLT 104*  --    ------------------------------------------------------------------------------------------------------------------ Chemistries  Recent Labs  Lab 03/03/18 2336  NA 145  K 3.6  CL 112*  CO2 26  GLUCOSE 130*  BUN 97*  CREATININE 4.31*  CALCIUM 8.3*  MG 2.7*  AST 52*  ALT 7  ALKPHOS 74  BILITOT 1.0   RADIOLOGY:  Dg Abd 1 View  Result Date: 03/03/2018 CLINICAL DATA:  OG placement EXAM: ABDOMEN - 1 VIEW COMPARISON:  02/28/2018 FINDINGS: A large hiatal hernia is noted. The esophageal tube tip is in the left upper quadrant and projects over the stomach. Pleural effusions and basilar airspace disease. Cardiomegaly. IMPRESSION: Esophageal tube tip projects below the diaphragm and is presumably within the gastric body Electronically Signed   By: Donavan Foil M.D.   On: 03/03/2018 23:11   Dg Chest Port 1 View  Result Date: 03/04/2018 CLINICAL DATA:  Acute respiratory failure EXAM: PORTABLE CHEST 1 VIEW COMPARISON:  03/03/2018 FINDINGS: Endotracheal tube, nasogastric catheter and dialysis catheter are again identified and stable. The nasogastric catheter is looped but remains within the stomach in a large hiatal hernia. Cardiac enlargement is again seen and stable. Bibasilar consolidation is again identified and stable. No new focal abnormality is noted. IMPRESSION: No significant change from  the previous day. Bibasilar consolidation is again seen. Electronically Signed   By: Inez Catalina M.D.   On: 03/04/2018 08:47   Dg Chest Port 1 View  Result Date:  03/03/2018 CLINICAL DATA:  Intubated EXAM: PORTABLE CHEST 1 VIEW COMPARISON:  03/03/2018, 02/23/2018, 12/18/2016 FINDINGS: Endotracheal tube tip is about 9 mm superior to the carina. Esophageal tube tip projects over the stomach, a large hiatal hernia is noted with decreased gastric distention. Right-sided central venous catheter tip over the right atrium. Cardiomegaly with vascular congestion, pleural effusion and pulmonary edema. Bibasilar consolidation. No pneumothorax. IMPRESSION: 1. Endotracheal tube tip about 9 mm superior to carina 2. Cardiomegaly with worsened vascular congestion and pulmonary edema. Small pleural effusions with continued basilar consolidations. 3. Decreased gaseous dilatation of large hiatal hernia. Electronically Signed   By: Donavan Foil M.D.   On: 03/03/2018 23:13   ASSESSMENT AND PLAN:  *Acute respiratory failure Intubated overnight.  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  Had hemodialysis yesterday  * Hypotension with h/o Essential (primary) hypertension - monitor in ICU Blood pressure is much better today  * Parkinson's disease ([place]) - Home dose Sinemet  * HLD (hyperlipidemia) - Home dose antilipid   Palliative care c/s -  Plan of care discussed with daughter at bedside today She is not appropriate for any kind of Rehab - no STR/SNF -Dr. Manuella Ghazi d/w son and daughter who is in agreement. They would like to take her home with home health [name])  All the records are reviewed and case discussed with Care Management/Social Worker. Management plans discussed with the patient, family and they are in agreement.  CODE  STATUS: Full Code  TOTAL TIME TAKING CARE OF THIS PATIENT: 35 minutes.   More than 50% of the time was spent in counseling/coordination of care: YES  POSSIBLE D/C IN 2- DAYS, DEPENDING ON CLINICAL CONDITION.   Nicholes Mango M.D on 03/04/2018 at 6:30 PM  Between 7am to 6pm - Pager - [phone]  After 6pm go to [email or web address] - Proofreader  [facility]  Office  [phone]  CC: Primary care physician; [place] Pink, MD  Note: This dictation was prepared with Dragon dictation along with smaller phrase technology. Any transcriptional errors that result from this process are unintentional.

## 2018-03-04 NOTE — Progress Notes (Signed)
This note also relates to the following rows which could not be included: Pulse Rate - Cannot attach notes to unvalidated device data Resp - Cannot attach notes to unvalidated device data  Hd completed  

## 2018-03-04 NOTE — Progress Notes (Signed)
Daily Progress Note   Patient Name: Brianna Coleman       Date: 03/04/2018 DOB: 09-01-1940  Age: 77 y.o. MRN#: [id number] Attending Physician: Nicholes Mango, MD Primary Care Physician: [place] Pink, MD Admit Date: 02/25/2018  Reason for Consultation/Follow-up: Establishing goals of care  Subjective: Patient is resting in bed with ventilator in place. Daughter Brianna Coleman who is POA is present at bedside. She states she has been kept updated. She states the plan as she understands it is to continue intubation and proceed with dialysis today, with possible extubation tomorrow.   She states she, her brother, and mother live together. She states [name] and her mother go out during the day, and walk laps around the [place] or the mall. Prior to her hip fracture in Dec 2016, she used a cane. She began using a walker following the injury and rehab stay. Her gait changed and became more unsteady. She uses a diaper for urinary incontinence. Brianna Coleman states her mother "sundowns" but has no known diagnosis of dementia. She sometimes hallucinates seeing small children or dogs, and has complained that the dog was licking her face.   Inquired on goals of care. She states she has had conversations with her mother in the past, and her mother would want to be intubated long enough to figure out what is going on and what can be done. She states they never discussed a time limit for intubation. She states quality of life is important and her mother would never want to live on lines, tubes, and machines.   Discussed if her mother would want to be reintubated following this extubation. She states she will need to speak with her brother to determine what the plan would be after extubation: reintubation vs comfort care.  She  states at this time, she would like to do what can be done for her mother. She would want chest compressions, shocks, and other indicated measures as needed.   The difference between an aggressive medical intervention path and a comfort care path was discussed. Concepts of Hospice and Palliative Care were discussed as well.   Length of Stay: 5  Current Medications: Scheduled Meds:  . aspirin  81 mg Per Tube Daily  . atorvastatin  80 mg Per Tube QPM  . carbidopa-levodopa  1 tablet Per Tube QID  .  chlorhexidine  15 mL Mouth Rinse BID  . chlorhexidine gluconate (MEDLINE KIT)  15 mL Mouth Rinse BID  . Chlorhexidine Gluconate Cloth  6 each Topical Q0600  . clopidogrel  75 mg Per Tube Daily  . heparin injection (subcutaneous)  5,000 Units Subcutaneous Q8H  . Influenza vac split quadrivalent PF  0.5 mL Intramuscular Tomorrow-1000  . mouth rinse  15 mL Mouth Rinse 10 times per day  . pantoprazole sodium  40 mg Per Tube Daily  . pramipexole  1 mg Per Tube TID  . tuberculin  5 Units Intradermal Once    Continuous Infusions: . sodium chloride Stopped (03/01/18 0031)  . sodium chloride    . sodium chloride    . ampicillin-sulbactam (UNASYN) IV Stopped (03/03/18 1808)  . DOPamine 2 mcg/kg/min (03/04/18 0839)  . fentaNYL infusion INTRAVENOUS 100 mcg/hr (03/04/18 0839)  . phenylephrine (NEO-SYNEPHRINE) Adult infusion Stopped (03/04/18 0340)  . propofol (DIPRIVAN) infusion 20 mcg/kg/min (03/04/18 0914)    PRN Meds: sodium chloride, sodium chloride, sodium chloride, acetaminophen, alteplase, docusate, fentaNYL, heparin, hydrALAZINE, ipratropium-albuterol, ketorolac, lidocaine (PF), lidocaine-prilocaine, pentafluoroprop-tetrafluoroeth  Physical Exam  Constitutional: No distress.  Pulmonary/Chest:  Intubated            Vital Signs: BP (!) 127/36   Pulse 64   Temp (!) 97.3 F (36.3 C) (Bladder)   Resp 16   Ht 5' 3"  (1.6 m)   Wt 55.8 kg   SpO2 100%   BMI 21.79 kg/m  SpO2: SpO2: 100  % O2 Device: O2 Device: Ventilator O2 Flow Rate: O2 Flow Rate (L/min): 15 L/min  Intake/output summary:   Intake/Output Summary (Last 24 hours) at 03/04/2018 0954 Last data filed at 03/04/2018 2831 Gross per 24 hour  Intake 556.13 ml  Output 1115 ml  Net -558.87 ml   LBM: Last BM Date: 03/14/2018 Baseline Weight: Weight: 55.3 kg Most recent weight: Weight: 55.8 kg       Palliative Assessment/Data: Intubated      Patient Active Problem List   Diagnosis Date Noted  . Protein-calorie malnutrition, severe 03/01/2018  . Cardiac arrest ([place]) 02/22/2018  . Acute renal failure superimposed on chronic kidney disease ([place]) 03/15/2018  . Anemia in stage 4 chronic kidney disease ([place]) 11/08/2017  . Heart murmur 11/08/2017  . 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 Anemia 07/30/2017  . Orthostatic hypotension 06/17/2017  . TIA (transient ischemic attack) 05/24/2017  . Hallucinations 04/26/2017  . Apraxia 12/21/2016  . Cervicogenic headache 02/05/2016  . 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 Cerebrovascular accident (CVA) ([place]) 08/12/2015  . Transient alteration of awareness 07/31/2015  . 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 Absolute anemia 01/26/2014  . Aortic valve defect 01/26/2014  . Congestive heart failure ([place]) 01/26/2014  . HLD (hyperlipidemia) 01/26/2014  . Pure hypercholesterolemia 01/26/2014  . Avitaminosis D 01/26/2014  . Carotid artery stenosis 09/19/2013  . Carotid artery obstruction 09/19/2013  . Occlusion and stenosis of carotid artery without mention of cerebral infarction 09/14/2013  . Paralysis agitans ([place]) 10/04/2012  . Abnormality of gait 10/04/2012  . Parkinson's disease ([place]) 10/04/2012  . Memory loss 06/21/2012  . Dysphagia, unspecified(787.20) 06/21/2012  . Unspecified essential hypertension 06/21/2012  . Other and unspecified hyperlipidemia 06/21/2012  . Unspecified cataract 06/21/2012  . Cataract 06/21/2012   . Can't get food down 06/21/2012  . Essential (primary) hypertension 06/21/2012    Palliative Care Assessment & Plan    Assessment: Respiratory arrest with code blue initiation last night.  Patient currently intubated.   Recommendations/Plan:  Continue current level of care.     Code Status:    Code Status Orders  (From admission, onward)         Start     Ordered   03/04/2018 2239  Full code  Continuous     03/15/2018 2238        Code Status History    Date Active Date Inactive Code Status Order ID Comments User Context   05/24/2017 1730 05/25/2017 2110 Full Code [id number]  Dustin Flock, MD ED   09/19/2013 1721 09/20/2013 1700 Full Code [id number]  Gabriel Earing, PA-C Inpatient    Advance Directive Documentation     Most Recent Value  Type of Advance Directive  Healthcare Power of Attorney  Pre-existing out of facility DNR order (yellow form or pink MOST form)  -  "MOST" Form in Place?  -       Prognosis:  Poor. Cardiac arrest with resusitation on admission. Respiratory arrest 9/13. Initiation of dialysis this admissions. Parkinson's.   Discharge Planning:  To Be Determined  Care plan was discussed with RN  Thank you for allowing the Palliative Medicine Team to assist in the care of this patient.   Time In: 9:20 Time Out: 10:00 Total Time 40 min Prolonged Time Billed  no      Greater than 50%  of this time was spent counseling and coordinating care related to the above assessment and plan.  Asencion Gowda, NP  Please contact Palliative Medicine Team phone at [phone] for questions and concerns.

## 2018-03-04 NOTE — Progress Notes (Signed)
Dr Jefferson Fuel aware of patients troponin 0.71. At this time no new orders given.

## 2018-03-04 NOTE — Progress Notes (Signed)
SLP Cancellation Note  Patient Details Name: Brianna Coleman MRN: [id number] DOB: Aug 03, 1940   Cancelled treatment:       Reason Eval/Treat Not Completed: Medical issues which prohibited therapy;Patient not medically ready(chart reviewed; pt had a decline in Medical status ). She is now orally intubated w/ consideration of vent wean next 1-2 days.  ST services will hold on services at this time and reassess pt's status on Monday.    Orinda Kenner, MS, CCC-SLP Watson,Katherine 03/04/2018, 10:44 AM

## 2018-03-05 LAB — CBC WITH DIFFERENTIAL/PLATELET
Basophils Absolute: 0 10*3/uL (ref 0–0.1)
Basophils Relative: 1 %
EOS ABS: 0.1 10*3/uL (ref 0–0.7)
Eosinophils Relative: 2 %
HEMATOCRIT: 22.4 % — AB (ref 35.0–47.0)
HEMOGLOBIN: 7.6 g/dL — AB (ref 12.0–16.0)
LYMPHS ABS: 0.6 10*3/uL — AB (ref 1.0–3.6)
Lymphocytes Relative: 12 %
MCH: 31.8 pg (ref 26.0–34.0)
MCHC: 34.1 g/dL (ref 32.0–36.0)
MCV: 93.4 fL (ref 80.0–100.0)
MONOS PCT: 7 %
Monocytes Absolute: 0.4 10*3/uL (ref 0.2–0.9)
Neutro Abs: 4 10*3/uL (ref 1.4–6.5)
Neutrophils Relative %: 78 %
Platelets: 110 10*3/uL — ABNORMAL LOW (ref 150–440)
RBC: 2.4 MIL/uL — ABNORMAL LOW (ref 3.80–5.20)
RDW: 16 % — ABNORMAL HIGH (ref 11.5–14.5)
WBC: 5.2 10*3/uL (ref 3.6–11.0)

## 2018-03-05 LAB — BLOOD GAS, ARTERIAL
Acid-Base Excess: 6.7 mmol/L — ABNORMAL HIGH (ref 0.0–2.0)
BICARBONATE: 30.4 mmol/L — AB (ref 20.0–28.0)
FIO2: 0.3
O2 Saturation: 95.6 %
PEEP: 5 cmH2O
PO2 ART: 72 mmHg — AB (ref 83.0–108.0)
Patient temperature: 37
Pressure support: 5 cmH2O
pCO2 arterial: 39 mmHg (ref 32.0–48.0)
pH, Arterial: 7.5 — ABNORMAL HIGH (ref 7.350–7.450)

## 2018-03-05 LAB — BASIC METABOLIC PANEL
Anion gap: 8 (ref 5–15)
BUN: 68 mg/dL — AB (ref 8–23)
CO2: 26 mmol/L (ref 22–32)
CREATININE: 3.06 mg/dL — AB (ref 0.44–1.00)
Calcium: 7.9 mg/dL — ABNORMAL LOW (ref 8.9–10.3)
Chloride: 110 mmol/L (ref 98–111)
GFR calc Af Amer: 16 mL/min — ABNORMAL LOW (ref >60)
GFR calc non Af Amer: 14 mL/min — ABNORMAL LOW (ref >60)
GLUCOSE: 119 mg/dL — AB (ref 70–99)
Potassium: 3 mmol/L — ABNORMAL LOW (ref 3.5–5.1)
Sodium: 144 mmol/L (ref 135–145)

## 2018-03-05 LAB — HEPATITIS B CORE ANTIBODY, TOTAL: HEP B C TOTAL AB: NEGATIVE

## 2018-03-05 LAB — GLUCOSE, CAPILLARY
Glucose-Capillary: 109 mg/dL — ABNORMAL HIGH (ref 70–99)
Glucose-Capillary: 114 mg/dL — ABNORMAL HIGH (ref 70–99)
Glucose-Capillary: 107 mg/dL — ABNORMAL HIGH (ref 70–99)
Glucose-Capillary: 121 mg/dL — ABNORMAL HIGH (ref 70–99)
Glucose-Capillary: 121 mg/dL — ABNORMAL HIGH (ref 70–99)

## 2018-03-05 LAB — PARATHYROID HORMONE, INTACT (NO CA): PTH: 88 pg/mL — ABNORMAL HIGH (ref 15–65)

## 2018-03-05 LAB — HEPATITIS B SURFACE ANTIGEN: HEP B S AG: NEGATIVE

## 2018-03-05 LAB — HEPATITIS B SURFACE ANTIBODY,QUALITATIVE: HEP B S AB: NONREACTIVE

## 2018-03-05 MED ORDER — FENTANYL CITRATE (PF) 100 MCG/2ML IJ SOLN
INTRAMUSCULAR | Status: AC
  Administered 2018-03-05: 12.5 ug via INTRAVENOUS
  Filled 2018-03-05: qty 2

## 2018-03-05 MED ORDER — FENTANYL CITRATE (PF) 100 MCG/2ML IJ SOLN
12.5000 ug | INTRAMUSCULAR | Status: DC | PRN
  Administered 2018-03-05 – 2018-03-07 (×4): 12.5 ug via INTRAVENOUS
  Filled 2018-03-05 (×3): qty 2

## 2018-03-05 MED ORDER — SODIUM CHLORIDE 0.9% FLUSH
10.0000 mL | Freq: Two times a day (BID) | INTRAVENOUS | Status: DC
  Administered 2018-03-05: 10 mL
  Administered 2018-03-06: 40 mL
  Administered 2018-03-07: 10 mL
  Administered 2018-03-07: 30 mL
  Administered 2018-03-08 – 2018-03-11 (×7): 10 mL
  Administered 2018-03-11: 30 mL

## 2018-03-05 MED ORDER — ORAL CARE MOUTH RINSE
15.0000 mL | Freq: Two times a day (BID) | OROMUCOSAL | Status: DC
  Administered 2018-03-06 – 2018-03-10 (×5): 15 mL via OROMUCOSAL

## 2018-03-05 MED ORDER — SODIUM CHLORIDE 0.9% FLUSH: 10.0000 mL | INTRAVENOUS | Status: DC | PRN

## 2018-03-05 NOTE — Progress Notes (Signed)
Follow up - Critical Care Medicine Note  Patient Details:    Brianna Coleman is an 77 y.o. female.Severe Parkinson's, orthostatic hypertension, REM behavior disorder, hyperlipidemia, aortic stenosis, hypertension, CVA,status post episode of epigastric pain, brief cardiac arrest and respiratory distress.   Lines, Airways, Drains: Airway 7.5 mm (Active)  Secured at (cm) 20 cm 03/04/2018  7:39 AM  Measured From Lips 03/04/2018  7:39 AM  Secured Location Center 03/04/2018  4:08 AM  Secured By [employer] 03/04/2018  7:39 AM  Tube Holder Repositioned Yes 03/04/2018  4:08 AM  Cuff Pressure (cm H2O) 26 cm H2O 03/04/2018  4:08 AM  Site Condition Dry 03/04/2018  7:39 AM     CVC Triple Lumen 03/03/18 Left Femoral (Active)  Indication for Insertion or Continuance of Line Prolonged intravenous therapies;Vasoactive infusions 03/04/2018  8:00 AM  Site Assessment Intact;Dry;Clean 03/03/2018 11:30 PM  Proximal Lumen Status Blood return noted;Infusing;Flushed 03/03/2018 11:30 PM  Medial Lumen Status Infusing;Flushed;Blood return noted 03/03/2018 11:30 PM  Distal Lumen Status Flushed;Capped (Central line);Blood return noted 03/03/2018 11:30 PM  Dressing Type Transparent;Occlusive 03/03/2018 11:30 PM  Dressing Status Clean;Intact;Antimicrobial disc in place;Dry 03/03/2018 11:30 PM  Line Care Connections checked and tightened 03/03/2018 11:30 PM  Dressing Change Due 03/10/18 03/03/2018 11:30 PM     NG/OG Tube Orogastric 16 Fr. Center mouth Xray Documented cm marking at nare/ corner of mouth (Active)  External Length of Tube (cm) - (if applicable) 56 cm 02/13/2352 12:00 AM  Site Assessment Dry;Intact;Clean 03/04/2018 12:00 AM  Ongoing Placement Verification No change in respiratory status;No change in cm markings or external length of tube from initial placement;No acute changes, not attributed to clinical condition 03/04/2018 12:00 AM  Status Clamped 03/04/2018 12:00 AM     Urethral Catheter Megan RN Non-latex  14 Fr. (Active)  Indication for Insertion or Continuance of Catheter Unstable critical patients (first 24-48 hours) 03/04/2018  7:32 AM  Site Assessment Clean;Intact;Dry 03/04/2018 12:00 AM  Catheter Maintenance Bag below level of bladder;Insertion date on drainage bag 03/04/2018  8:00 AM  Collection Container Standard drainage bag 03/04/2018 12:00 AM  Securement Method Leg strap 03/04/2018 12:00 AM  Urinary Catheter Interventions Unclamped 03/04/2018 12:00 AM  Output (mL) 200 mL 03/04/2018  8:39 AM    Anti-infectives:  Anti-infectives (From admission, onward)   Start     Dose/Rate Route Frequency Ordered Stop   03/03/18 0000  ceFAZolin (ANCEF) IVPB 1 g/50 mL premix  Status:  Discontinued     1 g 100 mL/hr over 30 Minutes Intravenous On call 03/14/2018 1400 03/13/2018 2331   03/18/2018 1400  ceFAZolin (ANCEF) IVPB 1 g/50 mL premix     1 g 100 mL/hr over 30 Minutes Intravenous  Once 03/09/2018 1351 03/01/2018 1515   02/28/18 1800  Ampicillin-Sulbactam (UNASYN) 3 g in sodium chloride 0.9 % 100 mL IVPB     3 g 200 mL/hr over 30 Minutes Intravenous Every 24 hours 02/28/18 1140 03/07/18 1759   02/28/18 0200  piperacillin-tazobactam (ZOSYN) IVPB 3.375 g  Status:  Discontinued     3.375 g 12.5 mL/hr over 240 Minutes Intravenous Every 12 hours 03/11/2018 2349 02/28/18 1008   02/22/2018 1930  ceFEPIme (MAXIPIME) 1 g in sodium chloride 0.9 % 100 mL IVPB     1 g 200 mL/hr over 30 Minutes Intravenous  Once 03/10/2018 1926 02/24/2018 2001      Microbiology: Results for orders placed or performed during the hospital encounter of 02/22/2018  Blood culture (routine x 2)  Status: None   Collection Time: 02/26/2018  7:54 PM  Result Value Ref Range Status   Specimen Description BLOOD RIGHT WRIST  Final   Special Requests   Final    BOTTLES DRAWN AEROBIC AND ANAEROBIC Blood Culture results may not be optimal due to an excessive volume of blood received in culture bottles   Culture   Final    NO GROWTH 5 DAYS Performed at  [facility], [address] [place], [place] [address]    Report Status 03/04/2018 FINAL  Final  MRSA PCR Screening     Status: None   Collection Time: 03/18/2018 10:39 PM  Result Value Ref Range Status   MRSA by PCR NEGATIVE NEGATIVE Final    Comment:        The GeneXpert MRSA Assay (FDA approved for NASAL specimens only), is one component of a comprehensive MRSA colonization surveillance program. It is not intended to diagnose MRSA infection nor to guide or monitor treatment for MRSA infections. Performed at [facility], [address] [place], [place] [address]   Blood culture (routine x 2)     Status: None   Collection Time: 02/25/2018 11:12 PM  Result Value Ref Range Status   Specimen Description BLOOD LEFT ARM  Final   Special Requests   Final    BOTTLES DRAWN AEROBIC AND ANAEROBIC Blood Culture results may not be optimal due to an inadequate volume of blood received in culture bottles   Culture   Final    NO GROWTH 5 DAYS Performed at [facility], [address] [place], [place] [address]    Report Status 03/04/2018 FINAL  Final    Studies: Dg Abd 1 View  Result Date: 03/03/2018 CLINICAL DATA:  OG placement EXAM: ABDOMEN - 1 VIEW COMPARISON:  02/28/2018 FINDINGS: A large hiatal hernia is noted. The esophageal tube tip is in the left upper quadrant and projects over the stomach. Pleural effusions and basilar airspace disease. Cardiomegaly. IMPRESSION: Esophageal tube tip projects below the diaphragm and is presumably within the gastric body Electronically Signed   By: Donavan Foil M.D.   On: 03/03/2018 23:11   Dg Abd 1 View  Result Date: 02/28/2018 CLINICAL DATA:  Abdominal pain EXAM: ABDOMEN - 1 VIEW COMPARISON:  None. FINDINGS: Gaseous distention of the stomach and small bowel. No dilated small bowel is visualized. Incompletely visualized cardiomegaly. IMPRESSION: Gas distended stomach. No radiographic evidence of small-bowel obstruction.  Electronically Signed   By: Ulyses Jarred M.D.   On: 02/28/2018 00:11   Ct Head Wo Contrast  Result Date: 02/26/2018 CLINICAL DATA:  Altered level of consciousness, unexplained. EXAM: CT HEAD WITHOUT CONTRAST TECHNIQUE: Contiguous axial images were obtained from the base of the skull through the vertex without intravenous contrast. COMPARISON:  Head CT 11/30/2017 FINDINGS: Brain: No intracranial hemorrhage, mass effect, or midline shift. Unchanged atrophy and chronic small vessel ischemia. Unchanged remote lacunar infarcts in the right greater than left basal ganglia. Small remote left parietal infarct again seen. No cerebral edema. No hydrocephalus. The basilar cisterns are patent. No evidence of territorial infarct or acute ischemia. No extra-axial or intracranial fluid collection. Vascular: Atherosclerosis of skullbase vasculature without hyperdense vessel or abnormal calcification. Skull: No fracture or focal lesion. Sinuses/Orbits: Paranasal sinuses and mastoid air cells are clear. The visualized orbits are unremarkable. Other: None. IMPRESSION: 1.  No acute intracranial abnormality. 2. Unchanged atrophy, chronic small vessel ischemia, and remote lacunar infarcts in the basal ganglia. Electronically Signed  By: Keith Rake M.D.   On: 03/11/2018 21:08   Dg Chest Port 1 View  Result Date: 03/04/2018 CLINICAL DATA:  Acute respiratory failure EXAM: PORTABLE CHEST 1 VIEW COMPARISON:  03/03/2018 FINDINGS: Endotracheal tube, nasogastric catheter and dialysis catheter are again identified and stable. The nasogastric catheter is looped but remains within the stomach in a large hiatal hernia. Cardiac enlargement is again seen and stable. Bibasilar consolidation is again identified and stable. No new focal abnormality is noted. IMPRESSION: No significant change from the previous day. Bibasilar consolidation is again seen. Electronically Signed   By: Inez Catalina M.D.   On: 03/04/2018 08:47   Dg Chest Port 1  View  Result Date: 03/03/2018 CLINICAL DATA:  Intubated EXAM: PORTABLE CHEST 1 VIEW COMPARISON:  03/03/2018, 02/28/2018, 12/18/2016 FINDINGS: Endotracheal tube tip is about 9 mm superior to the carina. Esophageal tube tip projects over the stomach, a large hiatal hernia is noted with decreased gastric distention. Right-sided central venous catheter tip over the right atrium. Cardiomegaly with vascular congestion, pleural effusion and pulmonary edema. Bibasilar consolidation. No pneumothorax. IMPRESSION: 1. Endotracheal tube tip about 9 mm superior to carina 2. Cardiomegaly with worsened vascular congestion and pulmonary edema. Small pleural effusions with continued basilar consolidations. 3. Decreased gaseous dilatation of large hiatal hernia. Electronically Signed   By: Donavan Foil M.D.   On: 03/03/2018 23:13   Dg Chest Port 1 View  Result Date: 03/03/2018 CLINICAL DATA:  Evaluate for pneumonia. EXAM: PORTABLE CHEST 1 VIEW COMPARISON:  03/21/2018 FINDINGS: Right dialysis catheter in place with the tip in the right atrium. Cardiomegaly with vascular congestion. Very low lung volumes with bibasilar atelectasis and marked gaseous distention of the stomach. No visible effusions or acute bony abnormality. IMPRESSION: Marked gaseous distention of the stomach. Very low lung volumes with bibasilar atelectasis. Cardiomegaly, vascular congestion. Electronically Signed   By: Rolm Baptise M.D.   On: 03/03/2018 10:48   Dg Chest Port 1 View  Result Date: 02/25/2018 CLINICAL DATA:  Cardiac arrest. EXAM: PORTABLE CHEST 1 VIEW COMPARISON:  Radiograph earlier this day. FINDINGS: Cardiac enlargement partially obscured by large retrocardiac hiatal hernia. Slight decreased gaseous gastric distension from prior. Aortic atherosclerosis. Mild vascular congestion without overt pulmonary edema. Probable bibasilar atelectasis with possible small pleural effusions. Improved linear right lung base atelectasis from prior. No  pneumothorax. IMPRESSION: 1. Probable bibasilar atelectasis and possible pleural effusions. Vascular congestion. 2. Cardiac enlargement, partially obscured by large hiatal hernia. Slight decreased gaseous gastric distension from prior. Electronically Signed   By: Keith Rake M.D.   On: 02/23/2018 23:22   Dg Chest Portable 1 View  Result Date: 03/09/2018 CLINICAL DATA:  Respiratory distress. EXAM: PORTABLE CHEST 1 VIEW COMPARISON:  12/18/2016. FINDINGS: Stable enlarged cardiac silhouette. Interval gaseous distention of the stomach, including a large hiatal hernia. Interval linear atelectasis at the right lung base. Clear left lung. Diffuse osteopenia. IMPRESSION: 1. Interval gaseous distention of the stomach, including a large hiatal hernia. 2. Interval linear atelectasis at the right lung base. Electronically Signed   By: Claudie Revering M.D.   On: 02/21/2018 19:18   Dg Abd Portable 1v  Result Date: 02/28/2018 CLINICAL DATA:  Nasogastric tube placement EXAM: PORTABLE ABDOMEN - 1 VIEW COMPARISON:  Portable exam 0138 hours compared to 02/23/2018 and correlated with prior CT abdomen and pelvis exam of 12/18/2016 FINDINGS: Nasogastric tube coiled in a large hiatal hernia at the lower LEFT hemithorax. Gaseous distention of the distal stomach below the diaphragm. Air-filled loops of  nondistended bowel throughout abdomen and pelvis. Bones demineralized. External pacing leads noted. IMPRESSION: Tip of nasogastric tube is coiled within a large hiatal hernia at the inferior LEFT chest. Electronically Signed   By: Lavonia Dana M.D.   On: 02/28/2018 02:00    Consults: Treatment Team:  Hermelinda Dellen, DO Corey Skains, MD Anthonette Legato, MD Schnier, Dolores Lory, MD   Subjective:    Overnight Issues: patient had transient episodes of bradycardia yesterday responded well atropine  Objective:  Vital signs for last 24 hours: Temp:  [97 F (36.1 C)-99.7 F (37.6 C)] 98.6 F (37 C) (09/14 0815) Pulse  Rate:  [41-99] 77 (09/14 0842) Resp:  [15-19] 16 (09/14 0842) BP: (53-173)/(33-69) 96/51 (09/14 0842) SpO2:  [95 %-100 %] 100 % (09/14 0842) FiO2 (%):  [30 %-40 %] 30 % (09/14 0750) Weight:  [53.8 kg-56.3 kg] 56.3 kg (09/14 0815)  Hemodynamic parameters for last 24 hours:    Intake/Output from previous day: 09/13 0701 - 09/14 0700 In: 1465.8 [I.V.:1364.8; IV Piggyback:100.9] Out: 1200 [Urine:950; Emesis/NG output:250]  Intake/Output this shift: Total I/O In: 98.4 [I.V.:98.4] Out: 150 [Urine:150]  Vent settings for last 24 hours: Vent Mode: PRVC FiO2 (%):  [30 %-40 %] 30 % Set Rate:  [16 bmp] 16 bmp Vt Set:  [450 mL] 450 mL PEEP:  [5 cmH20] 5 cmH20  Physical Exam:  Vital signs: Please see the above listed vital signs HEENT: Patient is orally intubated, orogastric tube in place, central line in place, trachea midline, no thyromegaly appreciated Cardiovascular: Regular rhythm, has had episodes of bradycardia Pulmonary: Coarse rhonchi appreciated Abdominal: Positive bowel sounds, soft exam Extremities: No clubbing cyanosis or edema noted Neurologic: Patient is sedated limited exam PermCath noted in place  Assessment/Plan:   Status post respiratory and cardiac arrest now 2 requiring intubation, mechanical ventilation. Patient is on Unasyn for empiric aspiration. Chest x-ray reveals right lower lung field airspace disease, large hiatal hernia, endotracheal tube, PermCath and oral gastric tube in place. Will begin spontaneous awakening and breathing trials today after dialysis  Shock. Transient hypotension with low blood pressures, some of this may be sedation related will try to wean off today  End-stage renal disease, on hemodialysis, per nephrology pending hemodialysis today. Labs from this morning pending  Anemia. Pending a.m. Labs  Parkinson's with dementia, orthostatic hypotension  Had discussion with family today. Wish [place] to continue aggressive care and readdress  goals of care if unable to improve  Critical care time 40 minutes  Robinn Overholt 03/05/2018  *Care during the described time interval was provided by me and/or other providers on the critical care team.  I have reviewed this patient's available data, including medical history, events of note, physical examination and test results as part of my evaluation. Patient ID: SAIRA KRAMME, female   DOB: May 07, 1941, 77 y.o.   MRN: [id number]

## 2018-03-05 NOTE — Progress Notes (Signed)
HD tx end    03/05/18 1115  Vital Signs  Pulse Rate 76  Pulse Rate Source Monitor  Resp 16  BP (!) 143/90  BP Location Left Arm  BP Method Automatic  Patient Position (if appropriate) Lying  Oxygen Therapy  SpO2 100 %  O2 Device Ventilator  End [place] CO2 (EtCO2) 31  During Hemodialysis Assessment  Dialysis Fluid Bolus Normal Saline  Bolus Amount (mL) 250 mL  Intra-Hemodialysis Comments Tx completed

## 2018-03-05 NOTE — Progress Notes (Signed)
Pre HD assessment   03/05/18 0815  Vital Signs  Temp 98.6 F (37 C)  Temp Source Bladder  Pulse Rate 77  Pulse Rate Source Monitor  Resp 19  BP (!) 101/50  BP Location Left Arm  BP Method Automatic  Patient Position (if appropriate) Lying  Oxygen Therapy  SpO2 100 %  O2 Device Ventilator  End Tidal CO2 (EtCO2) 27  Pain Assessment  Pain Scale CPOT  Critical Care Pain Observation Tool (CPOT)  Facial Expression 0  Body Movements 0  Muscle Tension 0  Compliance with ventilator (intubated pts.) 0  Vocalization (extubated pts.) N/A  CPOT Total 0  Dialysis Weight  Weight 56.3 kg  Type of Weight Pre-Dialysis  Time-Out for Hemodialysis  What Procedure? HD  Pt Identifiers(min of two) First/Last Name;MRN/Account#  Correct Site? Yes  Correct Side? Yes  Correct Procedure? Yes  Consents Verified? Yes  Rad Studies Available? N/A  Safety Precautions Reviewed? Yes  Engineer, civil (consulting) Number  (3A)  Station Number  (bedside ICU 07)  UF/Alarm Test Passed  Conductivity: Meter 14  Conductivity: Machine  13.9  pH 7.6  Reverse Osmosis  (WRO #1)  Normal Saline Lot Number [id number]  Dialyzer Lot Number [id number]  Disposable Set Lot Number [id number]  Machine Temperature 98.6 F (37 C)  Musician and Audible Yes  Blood Lines Intact and Secured Yes  Pre Treatment Patient Checks  Vascular access used during treatment Catheter  Hepatitis B Surface Antigen Results Negative  Date Hepatitis B Surface Antigen Drawn 03/03/18  Hepatitis B Surface Antibody  (<10)  Date Hepatitis B Surface Antibody Drawn 03/03/18  Hemodialysis Consent Verified Yes  Hemodialysis Standing Orders Initiated Yes  ECG (Telemetry) Monitor On Yes  Prime Ordered Normal Saline  Length of  DialysisTreatment -hour(s) 2.5 Hour(s)  Dialyzer Elisio 17H NR  Dialysate 3K, 2.5 Ca  Dialysis Anticoagulant None  Dialysate Flow Ordered 500  Blood Flow Rate Ordered 250 mL/min  Ultrafiltration Goal 0.5 Liters  Pre  Treatment Labs Phosphorus  Dialysis Blood Pressure Support Ordered Normal Saline  Education / Care Plan  Dialysis Education Provided Yes  Documented Education in Care Plan Yes  Hemodialysis Catheter Right Subclavian  Placement Date/Time: 03/09/2018 1454   Placed prior to admission: Yes  Time Out: Correct patient;Correct site;Correct procedure  Maximum sterile barrier precautions: Hand hygiene;Cap;Mask;Sterile gown;Sterile gloves;Large sterile sheet  Site Prep: Chlor...  Site Condition No complications  Blue Lumen Status Heparin locked  Red Lumen Status Heparin locked  Purple Lumen Status N/A  Dressing Type Biopatch  Dressing Status Clean;Dry;Intact  Drainage Description None

## 2018-03-05 NOTE — Progress Notes (Signed)
Turned sedation off. Patient able to follow commands. RT placed the ventilator on pressure support. Patient breathing by self comfortably for 30 minutes. ICU MD notified. ICU MD instructed to have patient on pressure support for one full hour and check a blood gas and to continue to monitor.

## 2018-03-05 NOTE — Progress Notes (Signed)
Post HD assessment. Pt tolerated tx well without c/o or complication. Net UF 504, goal met.    03/05/18 1123  Vital Signs  Temp 98.1 F (36.7 C)  Temp Source Bladder  Pulse Rate 84  Pulse Rate Source Monitor  Resp 16  BP (!) 146/83  BP Location Left Arm  BP Method Automatic  Patient Position (if appropriate) Lying  Oxygen Therapy  SpO2 100 %  O2 Device Ventilator  End Tidal CO2 (EtCO2) 30  Dialysis Weight  Weight 55.6 kg  Type of Weight Post-Dialysis  Post-Hemodialysis Assessment  Rinseback Volume (mL) 250 mL  KECN 36.5 V  Dialyzer Clearance Lightly streaked  Duration of HD Treatment -hour(s) 2.5 hour(s)  Hemodialysis Intake (mL) 500 mL  UF Total -Machine (mL) 1004 mL  Net UF (mL) 504 mL  Tolerated HD Treatment Yes  Education / Care Plan  Dialysis Education Provided Yes  Documented Education in Care Plan Yes  Hemodialysis Catheter Right Subclavian  Placement Date/Time: 02/23/2018 1454   Placed prior to admission: Yes  Time Out: Correct patient;Correct site;Correct procedure  Maximum sterile barrier precautions: Hand hygiene;Cap;Mask;Sterile gown;Sterile gloves;Large sterile sheet  Site Prep: Chlor...  Site Condition No complications  Blue Lumen Status Heparin locked  Red Lumen Status Heparin locked  Purple Lumen Status N/A  Catheter fill solution Heparin 1000 units/ml  Catheter fill volume (Arterial) 1.5 cc  Catheter fill volume (Venous) 1.5  Dressing Type Biopatch  Dressing Status Clean;Dry;Intact  Drainage Description None  Post treatment catheter status Capped and Clamped

## 2018-03-05 NOTE — Progress Notes (Signed)
Pre HD assessment    03/05/18 0820  Neurological  Level of Consciousness Responds to Voice  Orientation Level Intubated/Tracheostomy - Unable to assess  Respiratory  Respiratory Pattern Regular;Unlabored  Chest Assessment Chest expansion symmetrical  Cardiac  Pulse Regular  ECG Monitor Yes  Cardiac Rhythm NSR  Vascular  R Radial Pulse +1  L Radial Pulse +1  Integumentary  Integumentary (WDL) X  Skin Color Appropriate for ethnicity  Musculoskeletal  Musculoskeletal (WDL) X  Generalized Weakness Yes  Assistive Device None  GU Assessment  Genitourinary (WDL) X  Genitourinary Symptoms  (HD)  Psychosocial  Psychosocial (WDL) X  Patient Behaviors Not interactive  Psychosocial Additional Assessments Family behavior  Family Behavior Supportive;Calm;Cooperative  Emotional support given Given to patient's family

## 2018-03-05 NOTE — Progress Notes (Signed)
HD tx start    03/05/18 0842  Vital Signs  Pulse Rate 77  Pulse Rate Source Monitor  Resp 16  BP (!) 96/51  BP Location Left Arm  BP Method Automatic  Patient Position (if appropriate) Lying  Oxygen Therapy  SpO2 100 %  O2 Device Ventilator  End Tidal CO2 (EtCO2) 27  During Hemodialysis Assessment  Blood Flow Rate (mL/min) 250 mL/min  Arterial Pressure (mmHg) -80 mmHg  Venous Pressure (mmHg) 50 mmHg  Transmembrane Pressure (mmHg) 60 mmHg  Ultrafiltration Rate (mL/min) 380 mL/min  Dialysate Flow Rate (mL/min) 500 ml/min  Conductivity: Machine  13.9  HD Safety Checks Performed Yes  Dialysis Fluid Bolus Normal Saline  Bolus Amount (mL) 250 mL  Intra-Hemodialysis Comments Tx initiated  Hemodialysis Catheter Right Subclavian  Placement Date/Time: 02/28/2018 1454   Placed prior to admission: Yes  Time Out: Correct patient;Correct site;Correct procedure  Maximum sterile barrier precautions: Hand hygiene;Cap;Mask;Sterile gown;Sterile gloves;Large sterile sheet  Site Prep: Chlor...  Blue Lumen Status Infusing  Red Lumen Status Infusing

## 2018-03-05 NOTE — Progress Notes (Signed)
HOB elevated to high fowlers position, cuff deflated, suctioned orally and endotracheally and extubated to 4 lpm O2 [place]

## 2018-03-05 NOTE — Progress Notes (Signed)
[facility] NAME: Sable Knoles    MR#:  [id number]  DATE OF BIRTH:  03/10/1941  SUBJECTIVE:   Not extubated this morning. Sister in the room at bedside. Patient waking up a bit complaining of sore throat. REVIEW OF SYSTEMS:   Review of Systems  Unable to perform ROS: Medical condition   Tolerating Diet:npo Tolerating PT: pending  DRUG ALLERGIES:   Allergies  Allergen Reactions  . Levophed [Norepinephrine Bitartrate] Other (See Comments)    Pt with significant bradycardia.  Also EKG changes with inverted P wave and T wave morphology change    VITALS:  Blood pressure (!) 134/57, pulse 96, temperature 98.2 F (36.8 C), temperature source Bladder, resp. rate 16, height 5\' 3"  (1.6 m), weight 55.6 kg, SpO2 100 %.  PHYSICAL EXAMINATION:   Physical Exam  GENERAL:  77 y.o.-year-old patient lying in the bed with no acute distress. thin EYES: Pupils equal, round, reactive to light and accommodation. No scleral icterus. Extraocular muscles intact.  HEENT: Head atraumatic, normocephalic. Oropharynx and nasopharynx clear.  NECK:  Supple, no jugular venous distention. No thyroid enlargement, no tenderness.  LUNGS:decreased breath sounds bilaterally, no wheezing, rales, rhonchi. No use of accessory muscles of respiration.  CARDIOVASCULAR: S1, S2 normal. No murmurs, rubs, or gallops.  ABDOMEN: Soft, nontender, nondistended. Bowel sounds present. No organomegaly or mass.  EXTREMITIES: No cyanosis, clubbing or edema b/l.    NEUROLOGIC: moves spontaneoulsy--unable to assess since just got extubated PSYCHIATRIC:  patient is sleepy SKIN: No obvious rash, lesion, or ulcer.   LABORATORY PANEL:  CBC Recent Labs  Lab 03/05/18 0853  WBC 5.2  HGB 7.6*  HCT 22.4*  PLT 110*    Chemistries  Recent Labs  Lab 03/03/18 2336 03/05/18 0853  NA 145 144  K 3.6 3.0*  CL 112* 110  CO2 26 26  GLUCOSE 130* 119*  BUN 97* 68*   CREATININE 4.31* 3.06*  CALCIUM 8.3* 7.9*  MG 2.7*  --   AST 52*  --   ALT 7  --   ALKPHOS 74  --   BILITOT 1.0  --    Cardiac Enzymes Recent Labs  Lab 03/04/18 1750  TROPONINI 0.71*   RADIOLOGY:  Dg Abd 1 View  Result Date: 03/03/2018 CLINICAL DATA:  OG placement EXAM: ABDOMEN - 1 VIEW COMPARISON:  02/28/2018 FINDINGS: A large hiatal hernia is noted. The esophageal tube tip is in the left upper quadrant and projects over the stomach. Pleural effusions and basilar airspace disease. Cardiomegaly. IMPRESSION: Esophageal tube tip projects below the diaphragm and is presumably within the gastric body Electronically Signed   By: Donavan Foil M.D.   On: 03/03/2018 23:11   Dg Chest Port 1 View  Result Date: 03/04/2018 CLINICAL DATA:  Acute respiratory failure EXAM: PORTABLE CHEST 1 VIEW COMPARISON:  03/03/2018 FINDINGS: Endotracheal tube, nasogastric catheter and dialysis catheter are again identified and stable. The nasogastric catheter is looped but remains within the stomach in a large hiatal hernia. Cardiac enlargement is again seen and stable. Bibasilar consolidation is again identified and stable. No new focal abnormality is noted. IMPRESSION: No significant change from the previous day. Bibasilar consolidation is again seen. Electronically Signed   By: Inez Catalina M.D.   On: 03/04/2018 08:47   Dg Chest Port 1 View  Result Date: 03/03/2018 CLINICAL DATA:  Intubated EXAM: PORTABLE CHEST 1 VIEW COMPARISON:  03/03/2018, 02/26/2018, 12/18/2016 FINDINGS: Endotracheal tube tip is about 9 mm  superior to the carina. Esophageal tube tip projects over the stomach, a large hiatal hernia is noted with decreased gastric distention. Right-sided central venous catheter tip over the right atrium. Cardiomegaly with vascular congestion, pleural effusion and pulmonary edema. Bibasilar consolidation. No pneumothorax. IMPRESSION: 1. Endotracheal tube tip about 9 mm superior to carina 2. Cardiomegaly with  worsened vascular congestion and pulmonary edema. Small pleural effusions with continued basilar consolidations. 3. Decreased gaseous dilatation of large hiatal hernia. Electronically Signed   By: Donavan Foil M.D.   On: 03/03/2018 23:13   ASSESSMENT AND PLAN:    *Acute respiratory failure Intubated overnight--now extubated today.  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 Manuella Ghazi d/w son and daughter who is in agreement. They would like to take her home with home health [name])  discussed with Care Management/Social Worker. Management plans discussed with the patient, family and they are in agreement.  CODE STATUS: full  DVT Prophylaxis: lovenox  TOTAL TIME TAKING CARE OF THIS PATIENT: *30* minutes.  >50% time spent on counselling and coordination of care  POSSIBLE D/C IN *few* DAYS, DEPENDING ON CLINICAL CONDITION.  Note: This dictation was prepared with Dragon dictation along with smaller phrase technology. Any transcriptional errors that result from this process are unintentional.  Fritzi Mandes M.D on 03/05/2018 at 3:31 PM  Between 7am to 6pm - Pager - [phone]  After 6pm go to [email or web address] - password [email or web address] [facility]  Office  [phone]  CC: Primary care physician;  [place] Pink, MDPatient ID: Rolanda Jay, female   DOB: 08-Jun-1941, 77 y.o.   MRN: [id number]

## 2018-03-05 NOTE — Progress Notes (Signed)
[facility]  ROUNDING NOTE   Subjective:  Patient remains critically ill. She is remains on the ventilator at this time. Seen and evaluated during hemodialysis. Tolerating well thus far.   Objective:  Vital signs in last 24 hours:  Temp:  [97 F (36.1 C)-99.7 F (37.6 C)] 98.6 F (37 C) (09/14 0815) Pulse Rate:  [41-99] 84 (09/14 0945) Resp:  [15-19] 16 (09/14 0945) BP: (53-173)/(33-69) 104/55 (09/14 0945) SpO2:  [95 %-100 %] 100 % (09/14 0945) FiO2 (%):  [30 %-40 %] 30 % (09/14 0750) Weight:  [53.8 kg-56.3 kg] 56.3 kg (09/14 0815)  Weight change:  Filed Weights   03/03/18 0500 03/05/18 0224 03/05/18 0815  Weight: 55.8 kg 53.8 kg 56.3 kg    Intake/Output: I/O last 3 completed shifts: In: 1607.4 [I.V.:1506.5; IV Piggyback:100.9] Out: 1590 [Urine:1340; Emesis/NG output:250]   Intake/Output this shift:  Total I/O In: 98.4 [I.V.:98.4] Out: 150 [Urine:150]  Physical Exam: General: Critically ill-appearing  Head: Normocephalic, atraumatic.  ETT in place  Eyes: Anicteric  Neck: Supple, trachea midline  Lungs:  Scattered rhonchi, vent assisted  Heart: S1S2 no rubs  Abdomen:  Soft, nontender, bowel sounds present  Extremities: trace peripheral edema.  Neurologic: Intubated, sedated  Skin: No lesions  Access:  R IJ permcath    Basic Metabolic Panel: Recent Labs  Lab 03/07/2018 2314 03/01/18 0532 02/26/2018 0829 03/03/18 2336 03/05/18 0853  NA 142 144 147* 145 144  K 3.6 3.7 3.7 3.6 3.0*  CL 110 112* 113* 112* 110  CO2 21* 23 25 26 26   GLUCOSE 202* 126* 120* 130* 119*  BUN 52* 70* 88* 97* 68*  CREATININE 3.47* 4.13* 4.54* 4.31* 3.06*  CALCIUM 8.2* 8.1* 8.4* 8.3* 7.9*  MG 2.0  --   --  2.7*  --   PHOS 4.1  --  5.1* 3.5  --     Liver Function Tests: Recent Labs  Lab 02/28/2018 1841 03/10/2018 2314 02/26/2018 0829 03/03/18 2336  AST 31 82*  --  52*  ALT 5 10  --  7  ALKPHOS 81 76  --  74  BILITOT 0.7 0.6  --  1.0  PROT 6.3* 6.2*  --  5.4*   ALBUMIN 3.7 3.4* 3.0* 2.8*   Recent Labs  Lab 02/22/2018 1841  LIPASE 85*   No results for input(s): AMMONIA in the last 168 hours.  CBC: Recent Labs  Lab 03/03/2018 1841 03/01/18 0532 03/03/18 2336 03/04/18 0353 03/04/18 1319 03/05/18 0853  WBC 9.7 10.7 9.1 8.0  --  5.2  NEUTROABS 4.9 9.7* 8.1*  --   --  4.0  HGB 10.6* 8.7* 8.0* 7.4* 8.6* 7.6*  HCT 33.3* 25.9* 24.3* 22.0*  --  22.4*  MCV 100.5* 95.9 95.5 95.4  --  93.4  PLT 134* 100* 111* 104*  --  110*    Cardiac Enzymes: Recent Labs  Lab 02/28/18 0446 02/28/18 1057 03/01/18 0532 03/03/18 2336 03/04/18 1750  TROPONINI 3.57* 4.43* 2.57* 0.82* 0.71*    BNP: Invalid input(s): POCBNP  CBG: Recent Labs  Lab 03/04/18 1227 03/04/18 1832 03/04/18 2335 03/05/18 0320 03/05/18 0740  GLUCAP 67 98 112* 109* 114*    Microbiology: Results for orders placed or performed during the hospital encounter of 03/01/2018  Blood culture (routine x 2)     Status: None   Collection Time: 02/26/2018  7:54 PM  Result Value Ref Range Status   Specimen Description BLOOD RIGHT WRIST  Final   Special Requests   Final  BOTTLES DRAWN AEROBIC AND ANAEROBIC Blood Culture results may not be optimal due to an excessive volume of blood received in culture bottles   Culture   Final    NO GROWTH 5 DAYS Performed at [facility], [address] [place], [place] [address]    Report Status 03/04/2018 FINAL  Final  MRSA PCR Screening     Status: None   Collection Time: 03/13/2018 10:39 PM  Result Value Ref Range Status   MRSA by PCR NEGATIVE NEGATIVE Final    Comment:        The GeneXpert MRSA Assay (FDA approved for NASAL specimens only), is one component of a comprehensive MRSA colonization surveillance program. It is not intended to diagnose MRSA infection nor to guide or monitor treatment for MRSA infections. Performed at [facility], [address] [place], [place] [address]   Blood culture (routine x 2)      Status: None   Collection Time: 02/23/2018 11:12 PM  Result Value Ref Range Status   Specimen Description BLOOD LEFT ARM  Final   Special Requests   Final    BOTTLES DRAWN AEROBIC AND ANAEROBIC Blood Culture results may not be optimal due to an inadequate volume of blood received in culture bottles   Culture   Final    NO GROWTH 5 DAYS Performed at [facility], [address], [place], [place] [address]    Report Status 03/04/2018 FINAL  Final    Coagulation Studies: No results for input(s): LABPROT, INR in the last 72 hours.  Urinalysis: No results for input(s): COLORURINE, LABSPEC, PHURINE, GLUCOSEU, HGBUR, BILIRUBINUR, KETONESUR, PROTEINUR, UROBILINOGEN, NITRITE, LEUKOCYTESUR in the last 72 hours.  Invalid input(s): APPERANCEUR    Imaging: Dg Abd 1 View  Result Date: 03/03/2018 CLINICAL DATA:  OG placement EXAM: ABDOMEN - 1 VIEW COMPARISON:  02/28/2018 FINDINGS: A large hiatal hernia is noted. The esophageal tube tip is in the left upper quadrant and projects over the stomach. Pleural effusions and basilar airspace disease. Cardiomegaly. IMPRESSION: Esophageal tube tip projects below the diaphragm and is presumably within the gastric body Electronically Signed   By: Donavan Foil M.D.   On: 03/03/2018 23:11   Dg Chest Port 1 View  Result Date: 03/04/2018 CLINICAL DATA:  Acute respiratory failure EXAM: PORTABLE CHEST 1 VIEW COMPARISON:  03/03/2018 FINDINGS: Endotracheal tube, nasogastric catheter and dialysis catheter are again identified and stable. The nasogastric catheter is looped but remains within the stomach in a large hiatal hernia. Cardiac enlargement is again seen and stable. Bibasilar consolidation is again identified and stable. No new focal abnormality is noted. IMPRESSION: No significant change from the previous day. Bibasilar consolidation is again seen. Electronically Signed   By: Inez Catalina M.D.   On: 03/04/2018 08:47   Dg Chest Port 1 View  Result Date:  03/03/2018 CLINICAL DATA:  Intubated EXAM: PORTABLE CHEST 1 VIEW COMPARISON:  03/03/2018, 03/17/2018, 12/18/2016 FINDINGS: Endotracheal tube tip is about 9 mm superior to the carina. Esophageal tube tip projects over the stomach, a large hiatal hernia is noted with decreased gastric distention. Right-sided central venous catheter tip over the right atrium. Cardiomegaly with vascular congestion, pleural effusion and pulmonary edema. Bibasilar consolidation. No pneumothorax. IMPRESSION: 1. Endotracheal tube tip about 9 mm superior to carina 2. Cardiomegaly with worsened vascular congestion and pulmonary edema. Small pleural effusions with continued basilar consolidations. 3. Decreased gaseous dilatation of large hiatal hernia. Electronically Signed   By: Donavan Foil M.D.   On:  03/03/2018 23:13     Medications:   . sodium chloride Stopped (03/01/18 0031)  . sodium chloride    . sodium chloride    . ampicillin-sulbactam (UNASYN) IV Stopped (03/04/18 1817)  . dextrose 5 % and 0.45% NaCl 50 mL/hr at 03/05/18 0944  . DOPamine 3 mcg/kg/min (03/05/18 0836)  . fentaNYL infusion INTRAVENOUS 50 mcg/hr (03/05/18 0836)  . phenylephrine (NEO-SYNEPHRINE) Adult infusion Stopped (03/04/18 0340)  . propofol (DIPRIVAN) infusion 10 mcg/kg/min (03/05/18 0836)   . aspirin  81 mg Per Tube Daily  . atorvastatin  80 mg Per Tube QPM  . carbidopa-levodopa  1 tablet Per Tube QID  . chlorhexidine  15 mL Mouth Rinse BID  . chlorhexidine gluconate (MEDLINE KIT)  15 mL Mouth Rinse BID  . Chlorhexidine Gluconate Cloth  6 each Topical Q0600  . clopidogrel  75 mg Per Tube Daily  . heparin injection (subcutaneous)  5,000 Units Subcutaneous Q8H  . Influenza vac split quadrivalent PF  0.5 mL Intramuscular Tomorrow-1000  . mouth rinse  15 mL Mouth Rinse 10 times per day  . pantoprazole (PROTONIX) IV  40 mg Intravenous Q12H  . pramipexole  1 mg Per Tube TID  . sodium chloride flush  10-40 mL Intracatheter Q12H   sodium  chloride, sodium chloride, sodium chloride, acetaminophen, alteplase, docusate, fentaNYL, heparin, hydrALAZINE, ipratropium-albuterol, lidocaine (PF), lidocaine-prilocaine, pentafluoroprop-tetrafluoroeth, sodium chloride flush  Assessment/ Plan:  77 y.o. female with past medical history of hypertension, Parkinson's disorder, GI bleed, overactive bladder, iron deficiency anemia, hyperlipidemia, chronic musculoskeletal low back pain, chronic lower extremity edema, GERD, right basal ganglia CVA and vitamin D deficiency admitted with cardiac arrest.   1.  Acute renal failure/chronic kidney disease stage IV baseline EGFR 16.  Suspect that her acute renal failure now attributable to recent cardiac arrest.   -Urine output has improved a bit.  Patient seen and evaluated during hemodialysis this a.m.  Reassess for dialysis on Monday.  2.  Anemia of chronic kidney disease.  Slightly higher at 7.6.  Continue to monitor.  3.  Secondary hyperparathyroidism.  Phosphorus 3.5 at last check and acceptable.  To need to monitor bone mineral metabolism parameters.  4.  Acute respiratory failure/cardiac arrest.  The patient had a short-lived cardiac arrest 03/03/18.   -Maintain the patient on ventilatory support at this time.  Overall prognosis guarded given underlying comorbidities.   LOS: 6 Dalene Robards 9/14/20199:58 AM

## 2018-03-05 NOTE — Progress Notes (Signed)
Post HD assessment    03/05/18 1121  Neurological  Level of Consciousness Alert  Orientation Level Intubated/Tracheostomy - Unable to assess  Respiratory  Respiratory Pattern Regular;Unlabored  Chest Assessment Chest expansion symmetrical  Cardiac  Pulse Irregular  ECG Monitor Yes  Cardiac Rhythm NSR  Vascular  R Radial Pulse +1  L Radial Pulse +1  Integumentary  Integumentary (WDL) X  Skin Color Appropriate for ethnicity  Musculoskeletal  Musculoskeletal (WDL) X  Generalized Weakness Yes  Assistive Device None  GU Assessment  Genitourinary (WDL) X  Genitourinary Symptoms  (HD)  Psychosocial  Psychosocial (WDL) X  Patient Behaviors Not interactive  Needs Expressed Physical  Psychosocial Additional Assessments Family behavior  Family Behavior Supportive;Calm;Cooperative  Emotional support given Given to patient's family;Given to patient

## 2018-03-06 ENCOUNTER — Inpatient Hospital Stay: Payer: Medicare Other

## 2018-03-06 LAB — CBC
HEMATOCRIT: 21.7 % — AB (ref 35.0–47.0)
HEMOGLOBIN: 7.4 g/dL — AB (ref 12.0–16.0)
MCH: 31.9 pg (ref 26.0–34.0)
MCHC: 34.1 g/dL (ref 32.0–36.0)
MCV: 93.7 fL (ref 80.0–100.0)
PLATELETS: 110 10*3/uL — AB (ref 150–440)
RBC: 2.32 MIL/uL — AB (ref 3.80–5.20)
RDW: 16.1 % — ABNORMAL HIGH (ref 11.5–14.5)
WBC: 4.1 10*3/uL (ref 3.6–11.0)

## 2018-03-06 LAB — CBC WITH DIFFERENTIAL/PLATELET
BASOS PCT: 0 %
Basophils Absolute: 0 10*3/uL (ref 0–0.1)
EOS ABS: 0.1 10*3/uL (ref 0–0.7)
Eosinophils Relative: 1 %
HEMATOCRIT: 21.9 % — AB (ref 35.0–47.0)
HEMOGLOBIN: 7.4 g/dL — AB (ref 12.0–16.0)
Lymphocytes Relative: 10 %
Lymphs Abs: 0.5 10*3/uL — ABNORMAL LOW (ref 1.0–3.6)
MCH: 31.8 pg (ref 26.0–34.0)
MCHC: 33.6 g/dL (ref 32.0–36.0)
MCV: 94.6 fL (ref 80.0–100.0)
MONOS PCT: 8 %
Monocytes Absolute: 0.4 10*3/uL (ref 0.2–0.9)
NEUTROS PCT: 81 %
Neutro Abs: 3.9 10*3/uL (ref 1.4–6.5)
Platelets: 113 10*3/uL — ABNORMAL LOW (ref 150–440)
RBC: 2.32 MIL/uL — ABNORMAL LOW (ref 3.80–5.20)
RDW: 16 % — ABNORMAL HIGH (ref 11.5–14.5)
WBC: 4.8 10*3/uL (ref 3.6–11.0)

## 2018-03-06 LAB — COMPREHENSIVE METABOLIC PANEL
ALK PHOS: 56 U/L (ref 38–126)
ALT: 8 U/L (ref 0–44)
AST: 20 U/L (ref 15–41)
Albumin: 2.3 g/dL — ABNORMAL LOW (ref 3.5–5.0)
Anion gap: 5 (ref 5–15)
BILIRUBIN TOTAL: 0.6 mg/dL (ref 0.3–1.2)
BUN: 38 mg/dL — AB (ref 8–23)
CALCIUM: 8.1 mg/dL — AB (ref 8.9–10.3)
CO2: 28 mmol/L (ref 22–32)
Chloride: 105 mmol/L (ref 98–111)
Creatinine, Ser: 2.47 mg/dL — ABNORMAL HIGH (ref 0.44–1.00)
GFR, EST AFRICAN AMERICAN: 20 mL/min — AB (ref >60)
GFR, EST NON AFRICAN AMERICAN: 18 mL/min — AB (ref >60)
Glucose, Bld: 104 mg/dL — ABNORMAL HIGH (ref 70–99)
Potassium: 3.3 mmol/L — ABNORMAL LOW (ref 3.5–5.1)
Sodium: 138 mmol/L (ref 135–145)
Total Protein: 4.7 g/dL — ABNORMAL LOW (ref 6.5–8.1)

## 2018-03-06 LAB — GLUCOSE, CAPILLARY
GLUCOSE-CAPILLARY: 98 mg/dL (ref 70–99)
GLUCOSE-CAPILLARY: 100 mg/dL — AB (ref 70–99)
Glucose-Capillary: 119 mg/dL — ABNORMAL HIGH (ref 70–99)
Glucose-Capillary: 89 mg/dL (ref 70–99)
Glucose-Capillary: 96 mg/dL (ref 70–99)
Glucose-Capillary: 96 mg/dL (ref 70–99)

## 2018-03-06 LAB — RENAL FUNCTION PANEL
ALBUMIN: 2.3 g/dL — AB (ref 3.5–5.0)
ANION GAP: 6 (ref 5–15)
BUN: 32 mg/dL — ABNORMAL HIGH (ref 8–23)
CO2: 27 mmol/L (ref 22–32)
Calcium: 7.5 mg/dL — ABNORMAL LOW (ref 8.9–10.3)
Chloride: 109 mmol/L (ref 98–111)
Creatinine, Ser: 1.97 mg/dL — ABNORMAL HIGH (ref 0.44–1.00)
GFR calc non Af Amer: 23 mL/min — ABNORMAL LOW (ref >60)
GFR, EST AFRICAN AMERICAN: 27 mL/min — AB (ref >60)
Glucose, Bld: 120 mg/dL — ABNORMAL HIGH (ref 70–99)
PHOSPHORUS: 2.5 mg/dL (ref 2.5–4.6)
POTASSIUM: 3 mmol/L — AB (ref 3.5–5.1)
SODIUM: 142 mmol/L (ref 135–145)

## 2018-03-06 LAB — PHOSPHORUS: PHOSPHORUS: 4 mg/dL (ref 2.5–4.6)

## 2018-03-06 LAB — MAGNESIUM: MAGNESIUM: 2.1 mg/dL (ref 1.7–2.4)

## 2018-03-06 MED ORDER — STERILE WATER FOR INJECTION IJ SOLN
INTRAMUSCULAR | Status: AC
  Administered 2018-03-06: 11:00:00
  Filled 2018-03-06: qty 10

## 2018-03-06 MED ORDER — POTASSIUM CHLORIDE 10 MEQ/50ML IV SOLN
10.0000 meq | INTRAVENOUS | Status: AC
  Administered 2018-03-06 – 2018-03-07 (×4): 10 meq via INTRAVENOUS
  Filled 2018-03-06 (×4): qty 50

## 2018-03-06 NOTE — Progress Notes (Signed)
[facility]  ROUNDING NOTE   Subjective:  Patient extubated yesterday. Currently awake and alert. She also underwent hemodialysis yesterday.   Objective:  Vital signs in last 24 hours:  Temp:  [98.1 F (36.7 C)-99.9 F (37.7 C)] 99.9 F (37.7 C) (09/15 0000) Pulse Rate:  [73-96] 82 (09/15 0925) Resp:  [14-24] 15 (09/15 0925) BP: (89-146)/(47-83) 122/55 (09/15 0600) SpO2:  [86 %-100 %] 94 % (09/15 0925) Weight:  [55.6 kg-58.4 kg] 58.4 kg (09/15 0500)  Weight change: 2.5 kg Filed Weights   03/05/18 0815 03/05/18 1123 03/06/18 0500  Weight: 56.3 kg 55.6 kg 58.4 kg    Intake/Output: I/O last 3 completed shifts: In: 2184.7 [I.V.:2084.7; IV Piggyback:100] Out: 5284 [Urine:875; Emesis/NG output:100; Other:504]   Intake/Output this shift:  No intake/output data recorded.  Physical Exam: General:  No acute distress  Head: Normocephalic, atraumatic.  Oral mucosa moist  Eyes: Anicteric  Neck: Supple, trachea midline  Lungs:  Scattered rhonchi, dermal effort  Heart: S1S2 no rubs  Abdomen:  Soft, nontender, bowel sounds present  Extremities: trace peripheral edema.  Neurologic: Awake, alert, following commands  Skin: No lesions  Access:  R IJ permcath    Basic Metabolic Panel: Recent Labs  Lab 03/05/2018 2314 03/01/18 0532 03/16/2018 0829 03/03/18 2336 03/05/18 0853 03/06/18 0018  NA 142 144 147* 145 144 142  K 3.6 3.7 3.7 3.6 3.0* 3.0*  CL 110 112* 113* 112* 110 109  CO2 21* _0 GLUCOSE 202* 126* 120* 130* 119* 120*  BUN 52* 70* 88* 97* 68* 32*  CREATININE 3.47* 4.13* 4.54* 4.31* 3.06* 1.97*  CALCIUM 8.2* 8.1* 8.4* 8.3* 7.9* 7.5*  MG 2.0  --   --  2.7*  --   --   PHOS 4.1  --  5.1* 3.5  --  2.5    Liver Function Tests: Recent Labs  Lab 03/09/2018 1841 03/21/2018 2314 03/10/2018 0829 03/03/18 2336 03/06/18 0018  AST 31 82*  --  52*  --   ALT 5 10  --  7  --   ALKPHOS 81 76  --  74  --   BILITOT 0.7 0.6  --  1.0  --   PROT 6.3* 6.2*   --  5.4*  --   ALBUMIN 3.7 3.4* 3.0* 2.8* 2.3*   Recent Labs  Lab 03/17/2018 1841  LIPASE 85*   No results for input(s): AMMONIA in the last 168 hours.  CBC: Recent Labs  Lab 03/05/2018 1841 03/01/18 0532 03/03/18 2336 03/04/18 0353 03/04/18 1319 03/05/18 0853  WBC 9.7 10.7 9.1 8.0  --  5.2  NEUTROABS 4.9 9.7* 8.1*  --   --  4.0  HGB 10.6* 8.7* 8.0* 7.4* 8.6* 7.6*  HCT 33.3* 25.9* 24.3* 22.0*  --  22.4*  MCV 100.5* 95.9 95.5 95.4  --  93.4  PLT 134* 100* 111* 104*  --  110*    Cardiac Enzymes: Recent Labs  Lab 02/28/18 0446 02/28/18 1057 03/01/18 0532 03/03/18 2336 03/04/18 1750  TROPONINI 3.57* 4.43* 2.57* 0.82* 0.71*    BNP: Invalid input(s): POCBNP  CBG: Recent Labs  Lab 03/05/18 1541 03/05/18 2006 03/06/18 0010 03/06/18 0431 03/06/18 0744  GLUCAP 121* 121* 119* 98 22    Microbiology: Results for orders placed or performed during the hospital encounter of 03/14/2018  Blood culture (routine x 2)     Status: None   Collection Time: 03/05/2018  7:54 PM  Result Value Ref Range Status  Specimen Description BLOOD RIGHT WRIST  Final   Special Requests   Final    BOTTLES DRAWN AEROBIC AND ANAEROBIC Blood Culture results may not be optimal due to an excessive volume of blood received in culture bottles   Culture   Final    NO GROWTH 5 DAYS Performed at [facility], [address] [place], [place] [address]    Report Status 03/04/2018 FINAL  Final  MRSA PCR Screening     Status: None   Collection Time: 03/10/2018 10:39 PM  Result Value Ref Range Status   MRSA by PCR NEGATIVE NEGATIVE Final    Comment:        The GeneXpert MRSA Assay (FDA approved for NASAL specimens only), is one component of a comprehensive MRSA colonization surveillance program. It is not intended to diagnose MRSA infection nor to guide or monitor treatment for MRSA infections. Performed at [facility], [address] [place], [place] [address]   Blood culture  (routine x 2)     Status: None   Collection Time: 03/07/2018 11:12 PM  Result Value Ref Range Status   Specimen Description BLOOD LEFT ARM  Final   Special Requests   Final    BOTTLES DRAWN AEROBIC AND ANAEROBIC Blood Culture results may not be optimal due to an inadequate volume of blood received in culture bottles   Culture   Final    NO GROWTH 5 DAYS Performed at [facility], [address], [place], [place] [address]    Report Status 03/04/2018 FINAL  Final    Coagulation Studies: No results for input(s): LABPROT, INR in the last 72 hours.  Urinalysis: No results for input(s): COLORURINE, LABSPEC, PHURINE, GLUCOSEU, HGBUR, BILIRUBINUR, KETONESUR, PROTEINUR, UROBILINOGEN, NITRITE, LEUKOCYTESUR in the last 72 hours.  Invalid input(s): APPERANCEUR    Imaging: Dg Chest Port 1 View  Result Date: 03/06/2018 CLINICAL DATA:  Acute respiratory failure EXAM: PORTABLE CHEST 1 VIEW COMPARISON:  9/13/9 FINDINGS: Cardiac shadow is again enlarged. The endotracheal tube and nasogastric catheter has been removed. Large hiatal hernia is again identified. Right dialysis catheter is again seen. Stable bibasilar infiltrates are noted. No new focal abnormality is seen. IMPRESSION: Stable bibasilar infiltrates. Electronically Signed   By: Inez Catalina M.D.   On: 03/06/2018 07:32     Medications:   . sodium chloride Stopped (03/01/18 0031)  . sodium chloride    . sodium chloride    . ampicillin-sulbactam (UNASYN) IV Stopped (03/05/18 1916)  . dextrose 5 % and 0.45% NaCl 50 mL/hr at 03/06/18 0700  . DOPamine 2 mcg/kg/min (03/06/18 0600)  . fentaNYL infusion INTRAVENOUS Stopped (03/05/18 1141)  . propofol (DIPRIVAN) infusion Stopped (03/05/18 1312)   . aspirin  81 mg Per Tube Daily  . atorvastatin  80 mg Per Tube QPM  . carbidopa-levodopa  1 tablet Per Tube QID  . Chlorhexidine Gluconate Cloth  6 each Topical Q0600  . clopidogrel  75 mg Per Tube Daily  . heparin injection  (subcutaneous)  5,000 Units Subcutaneous Q8H  . mouth rinse  15 mL Mouth Rinse BID  . pantoprazole (PROTONIX) IV  40 mg Intravenous Q12H  . pramipexole  1 mg Per Tube TID  . sodium chloride flush  10-40 mL Intracatheter Q12H   sodium chloride, sodium chloride, sodium chloride, acetaminophen, alteplase, docusate, fentaNYL (SUBLIMAZE) injection, heparin, ipratropium-albuterol, lidocaine (PF), lidocaine-prilocaine, pentafluoroprop-tetrafluoroeth, sodium chloride flush  Assessment/ Plan:  77 y.o. female with past medical history of hypertension, Parkinson's disorder, GI bleed, overactive  bladder, iron deficiency anemia, hyperlipidemia, chronic musculoskeletal low back pain, chronic lower extremity edema, GERD, right basal ganglia CVA and vitamin D deficiency admitted with cardiac arrest.   1.  Acute renal failure/chronic kidney disease stage IV baseline EGFR 16.  Suspect that her acute renal failure now attributable to recent cardiac arrest.  She may have progressed to end-stage renal disease. -Urine output was low yesterday at 375 cc.  We will plan for hemodialysis again tomorrow.  Outpatient planning for [facility].  2.  Anemia of chronic kidney disease.  Hemoglobin currently 7.6.  She will need Epogen as an outpatient.  3.  Secondary hyperparathyroidism.  Phosphorus currently 2.5.  No indication for binders at the moment.  4.  Acute respiratory failure/cardiac arrest.  The patient had a short-lived cardiac arrest 03/03/18.  Extubated 03/05/2018. -Currently doing well off the ventilator.  It appears that she is had prior aspiration events which led to her acute respiratory failure.  Continue to monitor respiratory status closely.   LOS: 7 Brianna Coleman 9/15/201911:18 AM

## 2018-03-06 NOTE — Progress Notes (Addendum)
Pt accessed with increase in bilateral upper lobe rhonchi. Concern for aspiration. Weak, non-productive cough. Ice chips have been removed from bedside. Bed CPT with low intensity/vibration (per pt comfort) started. Dr Jefferson Fuel made aware-requests PRN breathing tx with NT suction as needed.

## 2018-03-06 NOTE — Progress Notes (Signed)
[name] NAME: Brianna Coleman    MR#:  [id number]  DATE OF BIRTH:  07-23-1940  SUBJECTIVE:   Got extubated yday awake and alert today. Not able to give any review of systems REVIEW OF SYSTEMS:   Review of Systems  Unable to perform ROS: Medical condition   Tolerating Diet:npo Tolerating PT: pending  DRUG ALLERGIES:   Allergies  Allergen Reactions  . Levophed [Norepinephrine Bitartrate] Other (See Comments)    Pt with significant bradycardia.  Also EKG changes with inverted P wave and T wave morphology change    VITALS:  Blood pressure (!) 107/49, pulse 83, temperature 97.8 F (36.6 C), temperature source Axillary, resp. rate 16, height 5\' 3"  (1.6 m), weight 58.4 kg, SpO2 93 %.  PHYSICAL EXAMINATION:   Physical Exam  GENERAL:  77 y.o.-year-old patient lying in the bed with no acute distress. thin EYES: Pupils equal, round, reactive to light and accommodation. No scleral icterus. Extraocular muscles intact.  HEENT: Head atraumatic, normocephalic. Oropharynx and nasopharynx clear.  NECK:  Supple, no jugular venous distention. No thyroid enlargement, no tenderness.  LUNGS:decreased breath sounds bilaterally, no wheezing, rales, rhonchi. No use of accessory muscles of respiration.  CARDIOVASCULAR: S1, S2 normal. No murmurs, rubs, or gallops.  ABDOMEN: Soft, nontender, nondistended. Bowel sounds present. No organomegaly or mass.  EXTREMITIES: No cyanosis, clubbing or edema b/l.    NEUROLOGIC: moves spontaneoulsy--unable to assess since just got extubated PSYCHIATRIC:  patient is sleepy SKIN: No obvious rash, lesion, or ulcer.   LABORATORY PANEL:  CBC Recent Labs  Lab 03/06/18 1134  WBC 4.8  HGB 7.4*  HCT 21.9*  PLT 113*    Chemistries  Recent Labs  Lab 03/03/18 2336  03/06/18 0018  NA 145   < > 142  K 3.6   < > 3.0*  CL 112*   < > 109  CO2 26   < > 27  GLUCOSE 130*   < > 120*  BUN 97*   < > 32*   CREATININE 4.31*   < > 1.97*  CALCIUM 8.3*   < > 7.5*  MG 2.7*  --   --   AST 52*  --   --   ALT 7  --   --   ALKPHOS 74  --   --   BILITOT 1.0  --   --    < > = values in this interval not displayed.   Cardiac Enzymes Recent Labs  Lab 03/04/18 1750  TROPONINI 0.71*   RADIOLOGY:  Dg Chest Port 1 View  Result Date: 03/06/2018 CLINICAL DATA:  Acute respiratory failure EXAM: PORTABLE CHEST 1 VIEW COMPARISON:  9/13/9 FINDINGS: Cardiac shadow is again enlarged. The endotracheal tube and nasogastric catheter has been removed. Large hiatal hernia is again identified. Right dialysis catheter is again seen. Stable bibasilar infiltrates are noted. No new focal abnormality is seen. IMPRESSION: Stable bibasilar infiltrates. Electronically Signed   By: Inez Catalina M.D.   On: 03/06/2018 07:32   ASSESSMENT AND PLAN:    *Acute respiratory failure Intubated overnight--now extubated today.  Off BiPA Follow-up with pulmonology  * Cardiac arrest ([place])  - Echo per cardio  - trend troponins peak at 4.4->2.57 -Cardiology is recommending conservative management given the comorbidities  *Acute renal failure superimposed on chronic kidney disease 4 - trending towards end-stage renal disease Right IJ permacath placed 03/03/2018 Nephrology started pt on dialysis  *Hypotension with h/o Essential (primary)  hypertension - monitor in ICU Blood pressure is much better today  *Parkinson's disease ([place]) - Home dose Sinemet  *HLD (hyperlipidemia) - Home dose antilipid   Palliative care c/s -  Plan of care discussed with daughter at bedside (by Dr Manuella Ghazi) She is not appropriate for any kind of Rehab - no STR/SNF -Dr. Manuella Ghazi d/w son and daughter who is in agreement. They would like to take her home with home health [name])  discussed with Care Management/Social Worker. Management plans discussed with the patient, family and they are in agreement.  CODE STATUS: full  DVT Prophylaxis:  lovenox  TOTAL TIME TAKING CARE OF THIS PATIENT: *30* minutes.  >50% time spent on counselling and coordination of care  POSSIBLE D/C IN *few* DAYS, DEPENDING ON CLINICAL CONDITION.  Note: This dictation was prepared with Dragon dictation along with smaller phrase technology. Any transcriptional errors that result from this process are unintentional.  Fritzi Mandes M.D on 03/06/2018 at 1:36 PM  Between 7am to 6pm - Pager - [phone]  After 6pm go to [email or web address] - password [email or web address] [facility]  Office  [phone]  CC: Primary care physician; [place] Pink, MDPatient ID: Brianna Coleman, female   DOB: [birth year], 77 y.o.   MRN: [id number]

## 2018-03-06 NOTE — Progress Notes (Signed)
Patient ID: Brianna Coleman, female   DOB: 15-Feb-1941, 77 y.o.   MRN: [id number] Follow up - Critical Care Medicine Note  Patient Details:    Brianna Coleman is an 77 y.o. female.Severe Parkinson's, orthostatic hypertension, REM behavior disorder, hyperlipidemia, aortic stenosis, hypertension, CVA,status post episode of epigastric pain, brief cardiac arrest and respiratory distress.   Lines, Airways, Drains: Airway 7.5 mm (Active)  Secured at (cm) 20 cm 03/04/2018  7:39 AM  Measured From Lips 03/04/2018  7:39 AM  Secured Location Center 03/04/2018  4:08 AM  Secured By [employer] 03/04/2018  7:39 AM  Tube Holder Repositioned Yes 03/04/2018  4:08 AM  Cuff Pressure (cm H2O) 26 cm H2O 03/04/2018  4:08 AM  Site Condition Dry 03/04/2018  7:39 AM     CVC Triple Lumen 03/03/18 Left Femoral (Active)  Indication for Insertion or Continuance of Line Prolonged intravenous therapies;Vasoactive infusions 03/04/2018  8:00 AM  Site Assessment Intact;Dry;Clean 03/03/2018 11:30 PM  Proximal Lumen Status Blood return noted;Infusing;Flushed 03/03/2018 11:30 PM  Medial Lumen Status Infusing;Flushed;Blood return noted 03/03/2018 11:30 PM  Distal Lumen Status Flushed;Capped (Central line);Blood return noted 03/03/2018 11:30 PM  Dressing Type Transparent;Occlusive 03/03/2018 11:30 PM  Dressing Status Clean;Intact;Antimicrobial disc in place;Dry 03/03/2018 11:30 PM  Line Care Connections checked and tightened 03/03/2018 11:30 PM  Dressing Change Due 03/10/18 03/03/2018 11:30 PM     NG/OG Tube Orogastric 16 Fr. Center mouth Xray Documented cm marking at nare/ corner of mouth (Active)  External Length of Tube (cm) - (if applicable) 56 cm 8/67/6720 12:00 AM  Site Assessment Dry;Intact;Clean 03/04/2018 12:00 AM  Ongoing Placement Verification No change in respiratory status;No change in cm markings or external length of tube from initial placement;No acute changes, not attributed to clinical condition 03/04/2018 12:00 AM   Status Clamped 03/04/2018 12:00 AM     Urethral Catheter Megan RN Non-latex 14 Fr. (Active)  Indication for Insertion or Continuance of Catheter Unstable critical patients (first 24-48 hours) 03/04/2018  7:32 AM  Site Assessment Clean;Intact;Dry 03/04/2018 12:00 AM  Catheter Maintenance Bag below level of bladder;Insertion date on drainage bag 03/04/2018  8:00 AM  Collection Container Standard drainage bag 03/04/2018 12:00 AM  Securement Method Leg strap 03/04/2018 12:00 AM  Urinary Catheter Interventions Unclamped 03/04/2018 12:00 AM  Output (mL) 200 mL 03/04/2018  8:39 AM    Anti-infectives:  Anti-infectives (From admission, onward)   Start     Dose/Rate Route Frequency Ordered Stop   03/03/18 0000  ceFAZolin (ANCEF) IVPB 1 g/50 mL premix  Status:  Discontinued     1 g 100 mL/hr over 30 Minutes Intravenous On call 03/10/2018 1400 03/06/2018 2331   03/01/2018 1400  ceFAZolin (ANCEF) IVPB 1 g/50 mL premix     1 g 100 mL/hr over 30 Minutes Intravenous  Once 03/13/2018 1351 03/04/2018 1515   02/28/18 1800  Ampicillin-Sulbactam (UNASYN) 3 g in sodium chloride 0.9 % 100 mL IVPB     3 g 200 mL/hr over 30 Minutes Intravenous Every 24 hours 02/28/18 1140 03/07/18 1759   02/28/18 0200  piperacillin-tazobactam (ZOSYN) IVPB 3.375 g  Status:  Discontinued     3.375 g 12.5 mL/hr over 240 Minutes Intravenous Every 12 hours 02/23/2018 2349 02/28/18 1008   03/05/2018 1930  ceFEPIme (MAXIPIME) 1 g in sodium chloride 0.9 % 100 mL IVPB     1 g 200 mL/hr over 30 Minutes Intravenous  Once 02/21/2018 1926 03/16/2018 2001      Microbiology: Results for orders placed  or performed during the hospital encounter of 03/06/2018  Blood culture (routine x 2)     Status: None   Collection Time: 03/13/2018  7:54 PM  Result Value Ref Range Status   Specimen Description BLOOD RIGHT WRIST  Final   Special Requests   Final    BOTTLES DRAWN AEROBIC AND ANAEROBIC Blood Culture results may not be optimal due to an excessive volume of blood  received in culture bottles   Culture   Final    NO GROWTH 5 DAYS Performed at [facility], [address] [place], [place] [address]    Report Status 03/04/2018 FINAL  Final  MRSA PCR Screening     Status: None   Collection Time: 03/13/2018 10:39 PM  Result Value Ref Range Status   MRSA by PCR NEGATIVE NEGATIVE Final    Comment:        The GeneXpert MRSA Assay (FDA approved for NASAL specimens only), is one component of a comprehensive MRSA colonization surveillance program. It is not intended to diagnose MRSA infection nor to guide or monitor treatment for MRSA infections. Performed at [facility], [address] [place], [place] [address]   Blood culture (routine x 2)     Status: None   Collection Time: 03/01/2018 11:12 PM  Result Value Ref Range Status   Specimen Description BLOOD LEFT ARM  Final   Special Requests   Final    BOTTLES DRAWN AEROBIC AND ANAEROBIC Blood Culture results may not be optimal due to an inadequate volume of blood received in culture bottles   Culture   Final    NO GROWTH 5 DAYS Performed at [facility], [address] [place], [place] [address]    Report Status 03/04/2018 FINAL  Final    Studies: Dg Abd 1 View  Result Date: 03/03/2018 CLINICAL DATA:  OG placement EXAM: ABDOMEN - 1 VIEW COMPARISON:  02/28/2018 FINDINGS: A large hiatal hernia is noted. The esophageal tube tip is in the left upper quadrant and projects over the stomach. Pleural effusions and basilar airspace disease. Cardiomegaly. IMPRESSION: Esophageal tube tip projects below the diaphragm and is presumably within the gastric body Electronically Signed   By: Donavan Foil M.D.   On: 03/03/2018 23:11   Dg Abd 1 View  Result Date: 02/28/2018 CLINICAL DATA:  Abdominal pain EXAM: ABDOMEN - 1 VIEW COMPARISON:  None. FINDINGS: Gaseous distention of the stomach and small bowel. No dilated small bowel is visualized. Incompletely visualized cardiomegaly.  IMPRESSION: Gas distended stomach. No radiographic evidence of small-bowel obstruction. Electronically Signed   By: Ulyses Jarred M.D.   On: 02/28/2018 00:11   Ct Head Wo Contrast  Result Date: 03/08/2018 CLINICAL DATA:  Altered level of consciousness, unexplained. EXAM: CT HEAD WITHOUT CONTRAST TECHNIQUE: Contiguous axial images were obtained from the base of the skull through the vertex without intravenous contrast. COMPARISON:  Head CT 11/30/2017 FINDINGS: Brain: No intracranial hemorrhage, mass effect, or midline shift. Unchanged atrophy and chronic small vessel ischemia. Unchanged remote lacunar infarcts in the right greater than left basal ganglia. Small remote left parietal infarct again seen. No cerebral edema. No hydrocephalus. The basilar cisterns are patent. No evidence of territorial infarct or acute ischemia. No extra-axial or intracranial fluid collection. Vascular: Atherosclerosis of skullbase vasculature without hyperdense vessel or abnormal calcification. Skull: No fracture or focal lesion. Sinuses/Orbits: Paranasal sinuses and mastoid air cells are clear. The visualized orbits are unremarkable. Other: None. IMPRESSION: 1.  No acute intracranial abnormality. 2.  Unchanged atrophy, chronic small vessel ischemia, and remote lacunar infarcts in the basal ganglia. Electronically Signed   By: Keith Rake M.D.   On: 03/11/2018 21:08   Dg Chest Port 1 View  Result Date: 03/06/2018 CLINICAL DATA:  Acute respiratory failure EXAM: PORTABLE CHEST 1 VIEW COMPARISON:  9/13/9 FINDINGS: Cardiac shadow is again enlarged. The endotracheal tube and nasogastric catheter has been removed. Large hiatal hernia is again identified. Right dialysis catheter is again seen. Stable bibasilar infiltrates are noted. No new focal abnormality is seen. IMPRESSION: Stable bibasilar infiltrates. Electronically Signed   By: Inez Catalina M.D.   On: 03/06/2018 07:32   Dg Chest Port 1 View  Result Date: 03/04/2018 CLINICAL  DATA:  Acute respiratory failure EXAM: PORTABLE CHEST 1 VIEW COMPARISON:  03/03/2018 FINDINGS: Endotracheal tube, nasogastric catheter and dialysis catheter are again identified and stable. The nasogastric catheter is looped but remains within the stomach in a large hiatal hernia. Cardiac enlargement is again seen and stable. Bibasilar consolidation is again identified and stable. No new focal abnormality is noted. IMPRESSION: No significant change from the previous day. Bibasilar consolidation is again seen. Electronically Signed   By: Inez Catalina M.D.   On: 03/04/2018 08:47   Dg Chest Port 1 View  Result Date: 03/03/2018 CLINICAL DATA:  Intubated EXAM: PORTABLE CHEST 1 VIEW COMPARISON:  03/03/2018, 03/20/2018, 12/18/2016 FINDINGS: Endotracheal tube tip is about 9 mm superior to the carina. Esophageal tube tip projects over the stomach, a large hiatal hernia is noted with decreased gastric distention. Right-sided central venous catheter tip over the right atrium. Cardiomegaly with vascular congestion, pleural effusion and pulmonary edema. Bibasilar consolidation. No pneumothorax. IMPRESSION: 1. Endotracheal tube tip about 9 mm superior to carina 2. Cardiomegaly with worsened vascular congestion and pulmonary edema. Small pleural effusions with continued basilar consolidations. 3. Decreased gaseous dilatation of large hiatal hernia. Electronically Signed   By: Donavan Foil M.D.   On: 03/03/2018 23:13   Dg Chest Port 1 View  Result Date: 03/03/2018 CLINICAL DATA:  Evaluate for pneumonia. EXAM: PORTABLE CHEST 1 VIEW COMPARISON:  02/24/2018 FINDINGS: Right dialysis catheter in place with the tip in the right atrium. Cardiomegaly with vascular congestion. Very low lung volumes with bibasilar atelectasis and marked gaseous distention of the stomach. No visible effusions or acute bony abnormality. IMPRESSION: Marked gaseous distention of the stomach. Very low lung volumes with bibasilar atelectasis.  Cardiomegaly, vascular congestion. Electronically Signed   By: Rolm Baptise M.D.   On: 03/03/2018 10:48   Dg Chest Port 1 View  Result Date: 03/11/2018 CLINICAL DATA:  Cardiac arrest. EXAM: PORTABLE CHEST 1 VIEW COMPARISON:  Radiograph earlier this day. FINDINGS: Cardiac enlargement partially obscured by large retrocardiac hiatal hernia. Slight decreased gaseous gastric distension from prior. Aortic atherosclerosis. Mild vascular congestion without overt pulmonary edema. Probable bibasilar atelectasis with possible small pleural effusions. Improved linear right lung base atelectasis from prior. No pneumothorax. IMPRESSION: 1. Probable bibasilar atelectasis and possible pleural effusions. Vascular congestion. 2. Cardiac enlargement, partially obscured by large hiatal hernia. Slight decreased gaseous gastric distension from prior. Electronically Signed   By: Keith Rake M.D.   On: 02/20/2018 23:22   Dg Chest Portable 1 View  Result Date: 03/21/2018 CLINICAL DATA:  Respiratory distress. EXAM: PORTABLE CHEST 1 VIEW COMPARISON:  12/18/2016. FINDINGS: Stable enlarged cardiac silhouette. Interval gaseous distention of the stomach, including a large hiatal hernia. Interval linear atelectasis at the right lung base. Clear left lung. Diffuse osteopenia. IMPRESSION: 1. Interval gaseous distention  of the stomach, including a large hiatal hernia. 2. Interval linear atelectasis at the right lung base. Electronically Signed   By: Claudie Revering M.D.   On: 02/28/2018 19:18   Dg Abd Portable 1v  Result Date: 02/28/2018 CLINICAL DATA:  Nasogastric tube placement EXAM: PORTABLE ABDOMEN - 1 VIEW COMPARISON:  Portable exam 0138 hours compared to 03/14/2018 and correlated with prior CT abdomen and pelvis exam of 12/18/2016 FINDINGS: Nasogastric tube coiled in a large hiatal hernia at the lower LEFT hemithorax. Gaseous distention of the distal stomach below the diaphragm. Air-filled loops of nondistended bowel throughout  abdomen and pelvis. Bones demineralized. External pacing leads noted. IMPRESSION: Tip of nasogastric tube is coiled within a large hiatal hernia at the inferior LEFT chest. Electronically Signed   By: Lavonia Dana M.D.   On: 02/28/2018 02:00    Consults: Treatment Team:  Hermelinda Dellen, DO Corey Skains, MD Anthonette Legato, MD Schnier, Dolores Lory, MD   Subjective:    Overnight Issues: Patient was successfully extubated yesterday, required BiPAP and NT suctioning last night for secretion clearance. Presently resting comfortably on BiPAP  Objective:  Vital signs for last 24 hours: Temp:  [98.1 F (36.7 C)-99.9 F (37.7 C)] 99.9 F (37.7 C) (09/15 0000) Pulse Rate:  [73-96] 77 (09/15 0600) Resp:  [14-24] 16 (09/15 0600) BP: (80-146)/(47-90) 122/55 (09/15 0600) SpO2:  [86 %-100 %] 100 % (09/15 0600) FiO2 (%):  [30 %] 30 % (09/14 1055) Weight:  [55.6 kg-58.4 kg] 58.4 kg (09/15 0500)  Hemodynamic parameters for last 24 hours:    Intake/Output from previous day: 09/14 0701 - 09/15 0700 In: 1371.1 [I.V.:1271.1; IV Piggyback:100] Out: 017 [Urine:375]  Intake/Output this shift: No intake/output data recorded.  Vent settings for last 24 hours: Vent Mode: PRVC FiO2 (%):  [30 %] 30 % Set Rate:  [16 bmp] 16 bmp Vt Set:  [450 mL] 450 mL PEEP:  [5 cmH20] 5 cmH20  Physical Exam:  Vital signs: Please see the above listed vital signs HEENT: Patient presently awake and alert, on BiPAP Cardiovascular: Regular rhythm, remains on 3 g of dopamine Pulmonary: Coarse rhonchi appreciated Abdominal: Positive bowel sounds, soft exam Extremities: No clubbing cyanosis or edema noted Neurologic: Patient is sedated limited exam PermCath noted in place  Assessment/Plan:   Status post respiratory and cardiac arrest now 2 requiring intubation, mechanical ventilation. Patient is on Unasyn for empiric aspiration. Chest x-ray reveals right lower lung field airspace disease, large hiatal hernia.  Did well postextubation, required BiPAP last night for increased work of breathing, also had difficulty with secretion clearance. My presumption is that secondary to her severe Parkinson's, swallowing dysfunction, hiatal hernia and altered mental status she has difficulty with secretion clearance potentially aspirate or mucous plugs and and suffers cardiac arrest from her hemodynamic instability from her Parkinson's  Shock. Remains on low-dose of dopamine for pulse and pressure support will continue to wean as tolerated  End-stage renal disease, on hemodialysis, per nephrology. This morning BUN is 32/creatinine 1.97, potassium 3.0  Anemia. Pending a.m. Labs  Parkinson's with dementia, orthostatic hypotension  Brianna Coleman 03/06/2018  *Care during the described time interval was provided by me and/or other providers on the critical care team.  I have reviewed this patient's available data, including medical history, events of note, physical examination and test results as part of my evaluation. Patient ID: SALLY-ANNE WAMBLE, female   DOB: 1940-09-30, 77 y.o.   MRN: [id number]

## 2018-03-07 LAB — BASIC METABOLIC PANEL
ANION GAP: 5 (ref 5–15)
BUN: 38 mg/dL — ABNORMAL HIGH (ref 8–23)
CHLORIDE: 108 mmol/L (ref 98–111)
CO2: 28 mmol/L (ref 22–32)
CREATININE: 2.43 mg/dL — AB (ref 0.44–1.00)
Calcium: 7.8 mg/dL — ABNORMAL LOW (ref 8.9–10.3)
GFR calc non Af Amer: 18 mL/min — ABNORMAL LOW (ref >60)
GFR, EST AFRICAN AMERICAN: 21 mL/min — AB (ref >60)
Glucose, Bld: 104 mg/dL — ABNORMAL HIGH (ref 70–99)
Potassium: 3.5 mmol/L (ref 3.5–5.1)
Sodium: 141 mmol/L (ref 135–145)

## 2018-03-07 LAB — GLUCOSE, CAPILLARY
GLUCOSE-CAPILLARY: 100 mg/dL — AB (ref 70–99)
GLUCOSE-CAPILLARY: 102 mg/dL — AB (ref 70–99)
GLUCOSE-CAPILLARY: 110 mg/dL — AB (ref 70–99)
GLUCOSE-CAPILLARY: 75 mg/dL (ref 70–99)
GLUCOSE-CAPILLARY: 78 mg/dL (ref 70–99)
GLUCOSE-CAPILLARY: 79 mg/dL (ref 70–99)
Glucose-Capillary: 90 mg/dL (ref 70–99)

## 2018-03-07 MED ORDER — TUBERCULIN PPD 5 UNIT/0.1ML ID SOLN
5.0000 [IU] | Freq: Once | INTRADERMAL | Status: DC
  Filled 2018-03-07: qty 0.1

## 2018-03-07 MED ORDER — RENA-VITE PO TABS
1.0000 | ORAL_TABLET | Freq: Every day | ORAL | Status: DC
  Administered 2018-03-07 – 2018-03-09 (×3): 1 via ORAL
  Filled 2018-03-07 (×3): qty 1

## 2018-03-07 MED ORDER — NEPRO/CARBSTEADY PO LIQD
237.0000 mL | Freq: Three times a day (TID) | ORAL | Status: DC
  Administered 2018-03-08 – 2018-03-10 (×4): 237 mL via ORAL

## 2018-03-07 NOTE — Evaluation (Signed)
Physical Therapy Evaluation Patient Details Name: Brianna Coleman MRN: [id number] DOB: 1940/12/24 Today's Date: 03/07/2018   History of Present Illness  presented to ER and admitted secondary to persistent diaphoresis, epigastric discomfort, underwent cardiac arrest with subsequent CPR in ER (requiring BiPAP after ROSC).  Hospital course additionally significant for worsening renal function (R chest perm cath planced 9/11 and dialysis initiated), elevated troponin (peak 4.4) consistent with NSTEMI and repeat cardiac arrest with CPR (requiring intubation 9/12-9/14 after ROSC).  Currently on 4L supplemental O2; awake and oriented to self, location; following commands and eager for OOB efforts.  Clinical Impression  Upon evaluation, patient alert and oriented to self, location; consistently follows simple commands and demonstrates good effort with all functional activities.  Bilat UE/LE generally weak and deconditioned throughout due to extended illness and hospitalization; denies significant pain at this time.  Currently requiring mod assist for bed mobility; mod assist +1-2 for sit/stand, basic transfers and very short-distance gait with RW.  Heavy posterior trunk lean/weight shift with all upright postures, but improves/accommodates with repetition and skilled intervention/handling from therapist.  Vitals stable and WFL; no subjective reports of dizziness or orthostasis.  However, very high risk for orthostasis (and subsequent fall/injury) given extended hospitalization and known illnesses. Patient significantly different from baseline and currently requiring hands-on assist from 2 helpers (but demonstrates great potential for recovery); unsafe to return home at this level of functional ability.  Given patient's known prior level of function (confirmed by both patient and daughter at bedside), patient was highly functional and generally independent prior to this admission.  Would benefit significantly from  post-acute rehab services in STR to facilitate optimal return to prior level of function prior to return home with family. Would benefit from skilled PT to address above deficits and promote optimal return to PLOF; recommend transition to STR upon discharge from acute hospitalization.     Follow Up Recommendations SNF    Equipment Recommendations       Recommendations for Other Services       Precautions / Restrictions Precautions Precautions: Fall Precaution Comments: R chest perm cath, L femoral central line Restrictions Weight Bearing Restrictions: No      Mobility  Bed Mobility Overal bed mobility: Needs Assistance Bed Mobility: Supine to Sit     Supine to sit: Mod assist     General bed mobility comments: assist for LE management, truncal elevation; limited ability to dissociate extremities and trunk.  Transfers Overall transfer level: Needs assistance Equipment used: Rolling walker (2 wheeled) Transfers: Sit to/from Stand Sit to Stand: Mod assist;+2 safety/equipment         General transfer comment: assist for lift off and anterior weight translation; heavy posterior weight shift, improving with each repetition  Ambulation/Gait Ambulation/Gait assistance: Mod assist;+2 safety/equipment Gait Distance (Feet): 5 Feet Assistive device: Rolling walker (2 wheeled)       General Gait Details: heavy posterior weight shift, constant hands-on assist to correct.  Very short, shuffling steps; difficulty fully extending and actively supporting self on L LE at times (baseline weakness from CVA).  Patient very eager to participate/progress as able.  Vitals stable and WFL; no subjective reports of dizziness/orthostasis.  Stairs            Wheelchair Mobility    Modified Rankin (Stroke Patients Only)       Balance Overall balance assessment: Needs assistance Sitting-balance support: No upper extremity supported;Feet supported Sitting balance-Leahy Scale:  Fair Sitting balance - Comments: min assist for initial sitting  balance, close sup with accommodation to position   Standing balance support: Bilateral upper extremity supported Standing balance-Leahy Scale: Poor                               Pertinent Vitals/Pain Pain Assessment: No/denies pain(generalized soreness from being in bed)    Home Living Family/patient expects to be discharged to:: Private residence Living Arrangements: Children Available Help at Discharge: Family;Available 24 hours/day Type of Home: House Home Access: Stairs to enter Entrance Stairs-Rails: Right(ascending) Entrance Stairs-Number of Steps: 3 Home Layout: One level Home Equipment: Walker - 2 wheels;Walker - 4 wheels;Toilet riser;Shower seat      Prior Function Level of Independence: Needs assistance         Comments: Sup/mod indep with RW for transfers, household and community distances (on a daily basis with son/daughter); family assists with ADLs and household chores/responsibiltiies as needed. No home O2.  Endorses single fall within previous six months.     Hand Dominance   Dominant Hand: Right    Extremity/Trunk Assessment   Upper Extremity Assessment Upper Extremity Assessment: Generalized weakness(grossly at least 3-/5 throughout)    Lower Extremity Assessment Lower Extremity Assessment: Generalized weakness(grossly at least 3-/5 throughout)       Communication   Communication: No difficulties  Cognition Arousal/Alertness: Awake/alert Behavior During Therapy: WFL for tasks assessed/performed Overall Cognitive Status: Within Functional Limits for tasks assessed                                 General Comments: alert and oriented to self, location; follows commands and demonstrates good effort with all functional activities      General Comments      Exercises Other Exercises Other Exercises: Sit/stand and static standing balance with RW, mod  assist +1-2-emphasis on postural alignment, weight shift and overall standing balance. Unsafe to complete without mod assist +1-2.   Assessment/Plan    PT Assessment Patient needs continued PT services  PT Problem List Decreased strength;Decreased range of motion;Decreased activity tolerance;Decreased balance;Decreased mobility;Decreased coordination;Decreased cognition;Decreased knowledge of use of DME;Decreased safety awareness;Decreased knowledge of precautions;Cardiopulmonary status limiting activity       PT Treatment Interventions DME instruction;Gait training;Stair training;Functional mobility training;Therapeutic activities;Balance training;Neuromuscular re-education;Therapeutic exercise;Patient/family education;Cognitive remediation    PT Goals (Current goals can be found in the Care Plan section)  Acute Rehab PT Goals Patient Stated Goal: to get up to chair, to get some water PT Goal Formulation: With patient/family Time For Goal Achievement: 03/21/18 Potential to Achieve Goals: Good    Frequency Min 2X/week   Barriers to discharge        Co-evaluation               AM-PAC PT "6 Clicks" Daily Activity  Outcome Measure Difficulty turning over in bed (including adjusting bedclothes, sheets and blankets)?: Unable Difficulty moving from lying on back to sitting on the side of the bed? : Unable Difficulty sitting down on and standing up from a chair with arms (e.g., wheelchair, bedside commode, etc,.)?: Unable Help needed moving to and from a bed to chair (including a wheelchair)?: A Lot Help needed walking in hospital room?: A Lot Help needed climbing 3-5 steps with a railing? : Total 6 Click Score: 8    End of Session Equipment Utilized During Treatment: Gait belt Activity Tolerance: Patient tolerated treatment well Patient left: in chair;with  call bell/phone within reach;with family/visitor present Nurse Communication: Mobility status PT Visit Diagnosis:  Unsteadiness on feet (R26.81);Muscle weakness (generalized) (M62.81);Difficulty in walking, not elsewhere classified (R26.2)    Time: 7654-6503 PT Time Calculation (min) (ACUTE ONLY): 33 min   Charges:   PT Evaluation $PT Eval Moderate Complexity: 1 Mod PT Treatments $Therapeutic Activity: 23-37 mins       Cezar Misiaszek H. Owens Shark, PT, DPT, NCS 03/07/18, 11:31 AM [phone]

## 2018-03-07 NOTE — Evaluation (Signed)
Clinical/Bedside Swallow Evaluation Patient Details  Name: Brianna Coleman MRN: [id number] Date of Birth: 04/18/1941  Today's Date: 03/07/2018 Time: SLP Start Time (ACUTE ONLY): 1150 SLP Stop Time (ACUTE ONLY): 1245 SLP Time Calculation (min) (ACUTE ONLY): 55 min  Past Medical History:  Past Medical History:  Diagnosis Date  . Anemia   . Aortic stenosis    mild AS by 03/20/13 echo [facility])  . Arthritis, degenerative   . Blood in stool   . Cancer ([place])    skin cancer  . 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 Chronic renal insufficiency    CKD stage IV, sees Dr Holley Raring in [place]  . Dyslipidemia   . Dysphagia   . Episode of syncope    Near-syncope  . Fall   . Gait disorder   . Gastroesophageal reflux disease   . Heart murmur   . Hiatal hernia with GERD   . History of blood transfusion   . Hypertension   . Memory disturbance   . Neurogenic bladder   . Orthostatic hypotension 06/17/2017  . Parkinson disease ([place])   . 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 RLS (restless legs syndrome)   . Shortness of breath    with exertion  . Stroke [facility])    no residual  . 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 CAROTID ENDARTERECTOMY    . CATARACT EXTRACTION, BILATERAL    . COLONOSCOPY    . DIALYSIS/PERMA CATHETER INSERTION N/A 02/21/2018   Procedure: DIALYSIS/PERMA CATHETER INSERTION;  Surgeon: Katha Cabal, MD;  Location: [place] [facility];  Service: Cardiovascular;  Laterality: N/A;  . ENDARTERECTOMY Right 09/19/2013   Procedure: ENDARTERECTOMY CAROTID WITH PATCH ANGIOPLASTY;  Surgeon: Elam Dutch, MD;  Location: [place];  Service: Vascular;  Laterality: Right;  . EYE SURGERY Bilateral    cataract  . UPPER GI ENDOSCOPY    . vaginal cyst removal  2009   HPI:  Brianna Coleman is a 77 y.o. female who presents with admission post cardiac arrest. Brianna Coleman  has a PMH of Multiple medical issues including Parkinson's Dis., GERD, stroke, and dysphagia per Dtr.  Patient has been having short episodes of diaphoresis and epigastric discomfort for the past 3 days or so.  These have been short-lived and self resolving.  Today however, she had the same symptoms that lasted significantly longer and were not resolving on their own.  She was brought to the ED for evaluation and when she arrived here underwent cardiac arrest.  She was resuscitated with 1 round of epinephrine and CPR.  She was able to become alert again, did not require intubation, but was placed on BiPAP. Daughter endorsed Cognitive decline baseline at home - noted H&P listed "memory disturbance". Brianna Coleman had a repeat cardiac arrest with CPR (requiring oral intubation 9/12-9/14). Currently NPO; on 4L supplemental O2; awake and oriented to self, location; following commands.    Assessment / Plan / Recommendation Clinical Impression  Brianna Coleman was seen for a Reassessment of Swallowing as Brianna Coleman had a decline in Medical Status requiring Re-intubation for ~3 days. She is now extubated and currently NPO. Brianna Coleman was sitting in a chair w/ Son at bedside; she presented w/ increased attention and verbal engagement w/ the po tasks. Brianna Coleman was only assessed w/ trials of Nectar liquids and purees at this re-assessment. Brianna Coleman appears to present w/ adequate oropharyngeal phase swallowing function  w/ the trial consistencies assessed. She remains at increased risk for dysphagia, aspiration d/t BASELINE Neurological deficits (Parkinson's Dis.) in light of Acute illness and hospitalization, and d/t baseline impact of the Large Hiatal Hernia on Esophageal clearing. When following general aspiration precautions as she has been doing at home per family, Brianna Coleman appears at reduced risk for aspiration but does have some feelings of Globus and regurgitation. OM exam was wfl w/ no unilateral weakness noted in lingual/labial movements during bolus control and  management. Brianna Coleman consumed trials of Nectar liquids Via Cup and purees w/ no immediate, overt s/s of aspiration noted; no decline in respiratory status(O2 sats remained upper 90s w/ no increase/decrease in RR) and no change in vocal quality appreciated during/post trials - quite clear and strong. Oral phase c/b fairly adequate oral control, timely A-P transfer, and full oral clearing b/t trials. No "pocketing" of the food trials. Brianna Coleman helped to Hold Cup when drinking but needed full support overall; Brianna Coleman does have some Cognitive decline at baseline as endorsed by Daughter, and chart review as well as overall weakness. Discussed general aspiration precautions and swallowing support strategies as used during this evaluation; Son practiced helping Brianna Coleman drink sips of liquid via Cup while Brianna Coleman held the Cup. Precautions posted in room. Discussed Brianna Coleman's baseline comorbidities which can increase risk for dysphagia during this time of Acute illness. Son agreed. Brianna Coleman appears declined from her baseline when seen in Dec. 2018. Recommend a dysphagia level 1 diet w/ Nectar consistency liquids; strict aspiration precautions; Pills in puree Crushed as able. Feeding support at all meals. ST services will f/u w/ trials to upgrade liquid consistency in diet as able. Son agreed. SLP Visit Diagnosis: Dysphagia, oropharyngeal phase (R13.12);Dysphagia, pharyngoesophageal phase (R13.14)    Aspiration Risk  Mild+ aspiration risk(baseline Neurological status; hiatal hernia); now extended illness, weakness    Diet Recommendation  Dysphagia level 1 (PUREE) w/ NECTAR consistency liquids; CUP ONLY w/ Brianna Coleman helping to [employer]. Aspiration precautions; Reflux precautions; assistance at meals.  Medication Administration: Crushed with puree(as able)    Other  Recommendations Recommended Consults: Consider GI evaluation(for education; Dietitian f/u) Oral Care Recommendations: Oral care BID;Staff/trained caregiver to provide oral care Other Recommendations:  Order thickener from pharmacy;Prohibited food (jello, ice cream, thin soups);Remove water pitcher;Have oral suction available   Follow up Recommendations Skilled Nursing facility(TBD)      Frequency and Duration min 3x week  2 weeks       Prognosis Prognosis for Safe Diet Advancement: Fair Barriers to Reach Goals: Cognitive deficits;Severity of deficits(baseline Neuro and Anatomical deficits) Barriers/Prognosis Comment: Parkinson's Dis., Hiatal Hernia      Swallow Study   General Date of Onset: 02/26/2018 HPI: Brianna Coleman is a 77 y.o. female who presents with admission post cardiac arrest. Brianna Coleman has a PMH of Multiple medical issues including Parkinson's Dis., GERD, stroke, and dysphagia per Dtr.  Patient has been having short episodes of diaphoresis and epigastric discomfort for the past 3 days or so.  These have been short-lived and self resolving.  Today however, she had the same symptoms that lasted significantly longer and were not resolving on their own.  She was brought to the ED for evaluation and when she arrived here underwent cardiac arrest.  She was resuscitated with 1 round of epinephrine and CPR.  She was able to become alert again, did not require intubation, but was placed on BiPAP. Daughter endorsed Cognitive decline baseline at home - noted H&P listed "memory disturbance". Brianna Coleman had a repeat cardiac  arrest with CPR (requiring oral intubation 9/12-9/14). Currently NPO; on 4L supplemental O2; awake and oriented to self, location; following commands.  Type of Study: Bedside Swallow Evaluation Previous Swallow Assessment: 02/28/2018; 05/2017 Diet Prior to this Study: Dysphagia 1 (puree);Thin liquids;NPO(prior to intubation; currently NPO) Temperature Spikes Noted: No(wbc 4.1) Respiratory Status: Nasal cannula(4 liters) History of Recent Intubation: Yes Length of Intubations (days): 3 days Date extubated: 03/05/18 Behavior/Cognition: Alert;Cooperative;Pleasant mood;Distractible;Requires  cueing Oral Cavity Assessment: Within Functional Limits(has been min dry) Oral Care Completed by SLP: Recent completion by staff Oral Cavity - Dentition: Adequate natural dentition Vision: Functional for self-feeding Self-Feeding Abilities: Able to feed self;Needs assist;Needs set up;Total assist(can hold own cup) Patient Positioning: Upright in chair(needed min more support w/ pillows forward) Baseline Vocal Quality: Low vocal intensity(but quite fair) Volitional Cough: Strong;Congested Volitional Swallow: Able to elicit    Oral/Motor/Sensory Function Overall Oral Motor/Sensory Function: Within functional limits   Ice Chips Ice chips: Not tested   Thin Liquid Thin Liquid: Not tested    Nectar Thick Nectar Thick Liquid: Within functional limits Presentation: Cup;Self Fed;Spoon(assisted d/t weakness in UEs; ~6+ ozs) Other Comments: Brianna Coleman drank Nepro also   Honey Thick Honey Thick Liquid: Not tested   Puree Puree: Within functional limits Presentation: Spoon(fed; 8 trials) Other Comments: even attempted to hold spoon w/ SLP   Solid     Solid: Not tested       Orinda Kenner, MS, CCC-SLP Cynai Skeens 03/07/2018,3:51 PM

## 2018-03-07 NOTE — Progress Notes (Signed)
TB test negative.

## 2018-03-07 NOTE — Progress Notes (Signed)
Nutrition Follow-up  DOCUMENTATION CODES:   Severe malnutrition in context of chronic illness  INTERVENTION:  Provide Nepro Shake po TID, each supplement provides 425 kcal and 19 grams protein.  Provide Rena-vite QHS.  Consider scheduled bowel regimen as patient has not had a BM since 9/12.  NUTRITION DIAGNOSIS:   Severe Malnutrition related to chronic illness(CKD stage IV, large hiatal hernia) as evidenced by moderate fat depletion, severe fat depletion, moderate muscle depletion, severe muscle depletion.  Ongoing.  GOAL:   Patient will meet greater than or equal to 90% of their needs  Progressing.  MONITOR:   PO intake, Supplement acceptance, Diet advancement, Labs, Weight trends, I & O's  REASON FOR ASSESSMENT:   Malnutrition Screening Tool    ASSESSMENT:   77 year old female with PMHx of Parkinson's disease, dyslipidemia, HTN, arthritis, GERD, RLS, neurogenic bladder, dysphagia, memory disturbance, gait disorder, hx CVA, aortic stenosis, CKD stage IV, hiatal hernia who is admitted after cardiac arrest, acute pulmonary edema, N/V, possible aspiration, abdominal pain, acute on chronic renal failure, also with large hiatal hernia.   -PermCath was placed on 9/11. -Plan was for initiation of HD on 9/12. However, patient suffered cardiac arrest on evening of 9/12 and was intubated. -Underwent first HD session on 9/13. Also had HD on 9/14. -Patient was extubated on 9/14. -Per Nephrology note outpatient planning for [facility]. -Following SLP evaluation today diet was advanced to dysphagia 1 with nectar-thick liquids.  Met with patient and her daughter at bedside. Patient was sitting up in chair. Daughter reports patient enjoyed the Nepro last week. Meal completion was not documented regularly last week so unsure how well patient was eating.   Medications reviewed and include: pantoprazole, D5-1/2NS @ 50 mL/hr.  Labs reviewed: CBG 75-96, BUN 38, Creatinine  2.43.  I/O: 350 mL UOP yesterday (0.3 mL/kg/hr)  Weight trend: 58.2 kg on 9/16; +2.9 kg from admission  Discussed with RN and on rounds. Plan is for HD today.  Diet Order:   Diet Order            DIET - DYS 1 Room service appropriate? Yes with Assist; Fluid consistency: Nectar Thick  Diet effective now              EDUCATION NEEDS:   Education needs have been addressed  Skin:  Skin Assessment: Reviewed RN Assessment  Last BM:  03/03/2018 - medium type 6  Height:   Ht Readings from Last 1 Encounters:  03/05/2018 5' 3"  (1.6 m)    Weight:   Wt Readings from Last 1 Encounters:  03/07/18 58.2 kg    Ideal Body Weight:  52.3 kg  BMI:  Body mass index is 22.73 kg/m.  Estimated Nutritional Needs:   Kcal:  3546-5681 (25-30 kcal/kg)  Protein:  83-94 grams (1.5-1.7 grams/kg)  Fluid:  UOP + 1L  Willey Blade, MS, RD, LDN Office: [phone] Pager: [phone] After Hours/Weekend Pager: [phone]

## 2018-03-07 NOTE — Progress Notes (Signed)
    03/04/18 1817  PPD Results  Does patient have an induration at the injection site? No  Induration(mm) 0 mm  Name of Physician Notified None

## 2018-03-07 NOTE — Progress Notes (Signed)
[facility] NAME: Brianna Coleman    MR#:  [id number]  DATE OF BIRTH:  1940-12-29  SUBJECTIVE:   Patient sitting out in the chair. Son in the room. Awake and alert today. Not able to give any review of systems. Any complaints. REVIEW OF SYSTEMS:   Review of Systems  Unable to perform ROS: Medical condition   Tolerating Diet:soft Tolerating PT: evaluation noted  DRUG ALLERGIES:   Allergies  Allergen Reactions  . Levophed [Norepinephrine Bitartrate] Other (See Comments)    Pt with significant bradycardia.  Also EKG changes with inverted P wave and T wave morphology change    VITALS:  Blood pressure (!) 112/51, pulse 79, temperature (!) 97.5 F (36.4 C), temperature source Bladder, resp. rate (!) 26, height 5\' 3"  (1.6 m), weight 58.2 kg, SpO2 (!) 60 %.  PHYSICAL EXAMINATION:   Physical Exam  GENERAL:  77 y.o.-year-old patient lying in the bed with no acute distress. thin EYES: Pupils equal, round, reactive to light and accommodation. No scleral icterus. Extraocular muscles intact.  HEENT: Head atraumatic, normocephalic. Oropharynx and nasopharynx clear.  NECK:  Supple, no jugular venous distention. No thyroid enlargement, no tenderness.  LUNGS:decreased breath sounds bilaterally, no wheezing, rales, rhonchi. No use of accessory muscles of respiration.  CARDIOVASCULAR: S1, S2 normal. No murmurs, rubs, or gallops.  ABDOMEN: Soft, nontender, nondistended. Bowel sounds present. No organomegaly or mass.  EXTREMITIES: No cyanosis, clubbing or edema b/l.    NEUROLOGIC: moves spontaneously, no focal weakness PSYCHIATRIC:  patient is alert and awake SKIN: No obvious rash, lesion, or ulcer.   LABORATORY PANEL:  CBC Recent Labs  Lab 03/06/18 2000  WBC 4.1  HGB 7.4*  HCT 21.7*  PLT 110*    Chemistries  Recent Labs  Lab 03/06/18 2000 03/07/18 0529  NA 138 141  K 3.3* 3.5  CL 105 108  CO2 28 28  GLUCOSE 104* 104*   BUN 38* 38*  CREATININE 2.47* 2.43*  CALCIUM 8.1* 7.8*  MG 2.1  --   AST 20  --   ALT 8  --   ALKPHOS 56  --   BILITOT 0.6  --    Cardiac Enzymes Recent Labs  Lab 03/04/18 1750  TROPONINI 0.71*   RADIOLOGY:  Dg Chest Port 1 View  Result Date: 03/06/2018 CLINICAL DATA:  Acute respiratory failure EXAM: PORTABLE CHEST 1 VIEW COMPARISON:  9/13/9 FINDINGS: Cardiac shadow is again enlarged. The endotracheal tube and nasogastric catheter has been removed. Large hiatal hernia is again identified. Right dialysis catheter is again seen. Stable bibasilar infiltrates are noted. No new focal abnormality is seen. IMPRESSION: Stable bibasilar infiltrates. Electronically Signed   By: Inez Catalina M.D.   On: 03/06/2018 07:32   ASSESSMENT AND PLAN:  77 y.o. white female with hypertension, Parkinson's disorder, GI bleed, overactive bladder, iron deficiency anemia, hyperlipidemia, chronic musculoskeletal low back pain, chronic lower extremity edema, GERD, right basal ganglia CVA and vitamin D deficiency admitted with cardiac arrest.   *Acute hypoxic respiratory failure Intubated overnight--now extubated today.   On RA  * Cardiac arrest ([place])  - Echo Left ventricle: The cavity size was normal. Systolic function was   normal. The estimated ejection fraction was in the range of 55%   to 60%. - Aortic valve: Bicuspid; mildly thickened, mildly calcified   leaflets. There was moderate stenosis. Valve area (VTI): 0.94   cm^2. Valve area (Vmax): 1.45 cm^2. Valve area (Vmean): 1.73  cm^2. - Mitral valve: There was mild regurgitation. 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 Hepatitis panel negative  *Hypotension with h/o Essential  (primary) hypertension - monitor in ICU Blood pressure is much better today  *Parkinson's disease ([place]) - Home dose Sinemet  *HLD (hyperlipidemia) - Home dose antilipid   Palliative care c/s -  Plan of care discussed with son at bedside. She is not appropriate for any kind of Rehab - no STR/SNF -son is in agreement. They would like to take her home with home health [facility]). Son stays home to take care of patient.  Outpatient dialysis chair time to be obtained. This was discussed with nephrology. Work in progress.  discussed with Care Management/Social Worker. Management plans discussed with the patient, family and they are in agreement.  CODE STATUS: full  DVT Prophylaxis: lovenox  TOTAL TIME TAKING CARE OF THIS PATIENT: *30* minutes.  >50% time spent on counselling and coordination of care  POSSIBLE D/C IN *few* DAYS, DEPENDING ON CLINICAL CONDITION.  Note: This dictation was prepared with Dragon dictation along with smaller phrase technology. Any transcriptional errors that result from this process are unintentional.  Fritzi Mandes M.D on 03/07/2018 at 3:06 PM  Between 7am to 6pm - Pager - [phone]  After 6pm go to [email or web address] - password [email or web address] [facility]  Office  [phone]  CC: Primary care physician; [place] Pink, MDPatient ID: Brianna Coleman, female   DOB: 1940/10/16, 77 y.o.   MRN: [id number]

## 2018-03-07 NOTE — Progress Notes (Signed)
[facility]  ROUNDING NOTE   Subjective:   Daughter at bedside  Confused this morning. Seems to be dyspnic  Hemodialysis later today   Objective:  Vital signs in last 24 hours:  Temp:  [97.2 F (36.2 C)-98.4 F (36.9 C)] 97.5 F (36.4 C) (09/16 0800) Pulse Rate:  [73-98] 77 (09/16 0800) Resp:  [13-35] 15 (09/16 0800) BP: (98-154)/(49-83) 154/73 (09/16 0800) SpO2:  [87 %-100 %] 98 % (09/16 0800) Weight:  [58.2 kg] 58.2 kg (09/16 0500)  Weight change: 1.9 kg Filed Weights   03/05/18 1123 03/06/18 0500 03/07/18 0500  Weight: 55.6 kg 58.4 kg 58.2 kg    Intake/Output: I/O last 3 completed shifts: In: 1832.7 [I.V.:1508.5; IV Piggyback:324.2] Out: 500 [Urine:500]   Intake/Output this shift:  Total I/O In: 10 [I.V.:10] Out: 50 [Urine:50]  Physical Exam: General: No acute distress  Head: Normocephalic, atraumatic.  Oral mucosa moist  Eyes: Anicteric  Neck: Supple, trachea midline  Lungs:  Scattered rhonchi  Heart: S1S2 no rubs  Abdomen:  Soft, nontender, bowel sounds present  Extremities: No peripheral edema.  Neurologic: Awake, alert, following commands  Skin: No lesions  Access:  R IJ permcath    Basic Metabolic Panel: Recent Labs  Lab 03/03/2018 0829 03/03/18 2336 03/05/18 0853 03/06/18 0018 03/06/18 2000 03/07/18 0529  NA 147* 145 144 142 138 141  K 3.7 3.6 3.0* 3.0* 3.3* 3.5  CL 113* 112* 110 109 105 108  CO2 _0 GLUCOSE 120* 130* 119* 120* 104* 104*  BUN 88* 97* 68* 32* 38* 38*  CREATININE 4.54* 4.31* 3.06* 1.97* 2.47* 2.43*  CALCIUM 8.4* 8.3* 7.9* 7.5* 8.1* 7.8*  MG  --  2.7*  --   --  2.1  --   PHOS 5.1* 3.5  --  2.5 4.0  --     Liver Function Tests: Recent Labs  Lab 02/25/2018 0829 03/03/18 2336 03/06/18 0018 03/06/18 2000  AST  --  52*  --  20  ALT  --  7  --  8  ALKPHOS  --  74  --  56  BILITOT  --  1.0  --  0.6  PROT  --  5.4*  --  4.7*  ALBUMIN 3.0* 2.8* 2.3* 2.3*   No results for input(s): LIPASE,  AMYLASE in the last 168 hours. No results for input(s): AMMONIA in the last 168 hours.  CBC: Recent Labs  Lab 03/01/18 0532 03/03/18 2336 03/04/18 0353 03/04/18 1319 03/05/18 0853 03/06/18 1134 03/06/18 2000  WBC 10.7 9.1 8.0  --  5.2 4.8 4.1  NEUTROABS 9.7* 8.1*  --   --  4.0 3.9  --   HGB 8.7* 8.0* 7.4* 8.6* 7.6* 7.4* 7.4*  HCT 25.9* 24.3* 22.0*  --  22.4* 21.9* 21.7*  MCV 95.9 95.5 95.4  --  93.4 94.6 93.7  PLT 100* 111* 104*  --  110* 113* 110*    Cardiac Enzymes: Recent Labs  Lab 02/28/18 1057 03/01/18 0532 03/03/18 2336 03/04/18 1750  TROPONINI 4.43* 2.57* 0.82* 0.71*    BNP: Invalid input(s): POCBNP  CBG: Recent Labs  Lab 03/06/18 1534 03/06/18 1950 03/07/18 0016 03/07/18 0413 03/07/18 0724  GLUCAP 96 89 79 78 75    Microbiology: Results for orders placed or performed during the hospital encounter of 02/22/2018  Blood culture (routine x 2)     Status: None   Collection Time: 03/01/2018  7:54 PM  Result Value Ref Range Status  Specimen Description BLOOD RIGHT WRIST  Final   Special Requests   Final    BOTTLES DRAWN AEROBIC AND ANAEROBIC Blood Culture results may not be optimal due to an excessive volume of blood received in culture bottles   Culture   Final    NO GROWTH 5 DAYS Performed at [facility], [address] [place], [place] [address]    Report Status 03/04/2018 FINAL  Final  MRSA PCR Screening     Status: None   Collection Time: 03/04/2018 10:39 PM  Result Value Ref Range Status   MRSA by PCR NEGATIVE NEGATIVE Final    Comment:        The GeneXpert MRSA Assay (FDA approved for NASAL specimens only), is one component of a comprehensive MRSA colonization surveillance program. It is not intended to diagnose MRSA infection nor to guide or monitor treatment for MRSA infections. Performed at [facility], [address] [place], [place] [address]   Blood culture (routine x 2)     Status: None   Collection Time:  03/10/2018 11:12 PM  Result Value Ref Range Status   Specimen Description BLOOD LEFT ARM  Final   Special Requests   Final    BOTTLES DRAWN AEROBIC AND ANAEROBIC Blood Culture results may not be optimal due to an inadequate volume of blood received in culture bottles   Culture   Final    NO GROWTH 5 DAYS Performed at [facility], [address] [place], [place] [address]    Report Status 03/04/2018 FINAL  Final    Coagulation Studies: No results for input(s): LABPROT, INR in the last 72 hours.  Urinalysis: No results for input(s): COLORURINE, LABSPEC, PHURINE, GLUCOSEU, HGBUR, BILIRUBINUR, KETONESUR, PROTEINUR, UROBILINOGEN, NITRITE, LEUKOCYTESUR in the last 72 hours.  Invalid input(s): APPERANCEUR    Imaging: Dg Chest Port 1 View  Result Date: 03/06/2018 CLINICAL DATA:  Acute respiratory failure EXAM: PORTABLE CHEST 1 VIEW COMPARISON:  9/13/9 FINDINGS: Cardiac shadow is again enlarged. The endotracheal tube and nasogastric catheter has been removed. Large hiatal hernia is again identified. Right dialysis catheter is again seen. Stable bibasilar infiltrates are noted. No new focal abnormality is seen. IMPRESSION: Stable bibasilar infiltrates. Electronically Signed   By: Inez Catalina M.D.   On: 03/06/2018 07:32     Medications:   . sodium chloride Stopped (03/01/18 0031)  . sodium chloride    . sodium chloride    . dextrose 5 % and 0.45% NaCl 50 mL/hr at 03/07/18 0905  . DOPamine Stopped (03/06/18 1724)  . fentaNYL infusion INTRAVENOUS Stopped (03/05/18 1141)  . propofol (DIPRIVAN) infusion Stopped (03/05/18 1312)   . aspirin  81 mg Per Tube Daily  . atorvastatin  80 mg Per Tube QPM  . carbidopa-levodopa  1 tablet Per Tube QID  . Chlorhexidine Gluconate Cloth  6 each Topical Q0600  . clopidogrel  75 mg Per Tube Daily  . heparin injection (subcutaneous)  5,000 Units Subcutaneous Q8H  . mouth rinse  15 mL Mouth Rinse BID  . pantoprazole (PROTONIX) IV  40 mg  Intravenous Q12H  . pramipexole  1 mg Per Tube TID  . sodium chloride flush  10-40 mL Intracatheter Q12H   sodium chloride, sodium chloride, sodium chloride, acetaminophen, alteplase, docusate, fentaNYL (SUBLIMAZE) injection, heparin, ipratropium-albuterol, lidocaine (PF), lidocaine-prilocaine, pentafluoroprop-tetrafluoroeth, sodium chloride flush  Assessment/ Plan:  77 y.o. white female with hypertension, Parkinson's disorder, GI bleed, overactive bladder, iron deficiency anemia, hyperlipidemia, chronic musculoskeletal low back pain, chronic lower  extremity edema, GERD, right basal ganglia CVA and vitamin D deficiency admitted with cardiac arrest.   1.  Acute renal failure/chronic kidney disease stage IV baseline EGFR 16.  Suspect that her acute renal failure now attributable to recent cardiac arrest.  She may have progressed to end-stage renal disease. RIJ permcath placed - Oliguric urine output - Hemodialysis for later today. Orders prepared.  Outpatient planning for [employer].  2.  Anemia of chronic kidney disease.  Hemoglobin currently 7.4.   - EPO as outpatient.  3.  Secondary hyperparathyroidism. PTH 88 - low for level of kidney function. Corrected calcium and phosphorus at goal. Not currently on vitamin D agents.   4.  Acute respiratory failure/cardiac arrest.  The patient had a short-lived cardiac arrest 03/03/18.  Extubated 03/05/2018.  5. Hypertension: 154/73 - sinemet, nifedipine as outpatient. Restart sinemet when SLP clears.    LOS: 8 Eola Waldrep 9/16/20199:22 AM

## 2018-03-07 NOTE — Evaluation (Signed)
Occupational Therapy Evaluation Patient Details Name: Brianna Coleman MRN: [id number] DOB: 1940-12-11 Today's Date: 03/07/2018    History of Present Illness 77yo female pt presented to ER and admitted secondary to persistent diaphoresis, epigastric discomfort, underwent cardiac arrest with subsequent CPR in ER (requiring BiPAP after ROSC).  Hospital course additionally significant for worsening renal function (R chest perm cath planced 9/11 and dialysis initiated), elevated troponin (peak 4.4) consistent with NSTEMI and repeat cardiac arrest with CPR (requiring intubation 9/12-9/14 after ROSC).  Currently on 4L supplemental O2; awake and oriented to self, location; following commands and eager for OOB efforts.   Clinical Impression   Pt seen for OT evaluation this date. Pt alert and oriented to self and location. Son in room. Pt reporting no pain/discomfort up in recliner and agreeable to OT. Prior to hospital admission, pt was living with her son and daughter in a 1 story home with 3 steps to enter (son planning to have ramp installed soon). Pt was ambulating with supervision using a RW and required assist from son/daughter for bathing, dressing, and toileting needs (managing incontinence products). Son notes that pt has been having increased difficulty over the couple months with self feeding, depending on the day and severity of Parkinson's symptoms. Son notes many "close calls" in terms of LOB and falling but only 2 actual falls in past 12 months that he can recall. Currently pt demonstrates impairments in activity tolerance, strength, DME use, and balance requiring mod assist to initiate LB dressing over feet and complete over hips. Pt fatigues easily. O2 sats noted to be >95% using therapist's pulse ox. Difficulty reading and RN notified to monitor ear O2 sats monitoring. Pt/son instructed in energy conservation strategies to support pt's participation in ADL/daily routines and handout was provided.  Pt would benefit from skilled OT to address noted impairments and functional limitations (see below for any additional details) in order to maximize safety and independence while minimizing falls risk and caregiver burden.  Upon hospital discharge, recommend pt discharge to STR to facilitate optimally safe return home with family assist.     Follow Up Recommendations  SNF    Equipment Recommendations  Other (comment)(TBD)    Recommendations for Other Services       Precautions / Restrictions Precautions Precautions: Fall Precaution Comments: R chest perm cath, L femoral central line Restrictions Weight Bearing Restrictions: No      Mobility Bed Mobility               General bed mobility comments: deferred up in recliner  Transfers Overall transfer level: Needs assistance Equipment used: Rolling walker (2 wheeled) Transfers: Sit to/from Stand Sit to Stand: Mod assist;+2 safety/equipment              Balance Overall balance assessment: Needs assistance Sitting-balance support: No upper extremity supported;Feet supported Sitting balance-Leahy Scale: Fair     Standing balance support: Bilateral upper extremity supported Standing balance-Leahy Scale: Poor                             ADL either performed or assessed with clinical judgement   ADL Overall ADL's : Needs assistance/impaired Eating/Feeding: Sitting;With caregiver independent assisting;Minimal assistance;Set up Eating/Feeding Details (indicate cue type and reason): diet recently changed by SLP - puree, nectar thick liquids; son able to provide assist as needed depending on pt's severity of parkinson's symptoms on a given day Grooming: Sitting;Set up;With caregiver independent assisting;Minimal assistance  Upper Body Bathing: Sitting;Minimal assistance;Moderate assistance   Lower Body Bathing: Sit to/from stand;Moderate assistance   Upper Body Dressing : Sitting;Minimal  assistance;Moderate assistance   Lower Body Dressing: Sit to/from stand;Moderate assistance   Toilet Transfer: RW;Stand-pivot;Moderate assistance;Cueing for safety                   Vision Baseline Vision/History: (son reports pt has a prescription but doesn't wear glasses) Patient Visual Report: No change from baseline Vision Assessment?: No apparent visual deficits     Perception     Praxis      Pertinent Vitals/Pain Pain Assessment: No/denies pain     Hand Dominance Right   Extremity/Trunk Assessment Upper Extremity Assessment Upper Extremity Assessment: Generalized weakness(grossly at least 3-/5 throughout)   Lower Extremity Assessment Lower Extremity Assessment: Generalized weakness(grossly at least 3-/5 throughout)       Communication Communication Communication: No difficulties   Cognition Arousal/Alertness: Awake/alert Behavior During Therapy: WFL for tasks assessed/performed;Restless Overall Cognitive Status: Within Functional Limits for tasks assessed                                 General Comments: alert and oriented to self, location; follows commands and demonstrates good effort with all functional activities; slightly restless but easily redirected   General Comments  HR 84-90, BP 112/51, O2 in mid-high 90's (RN notified of varied O2 sat readings discrepancies from ear monitor vs therapist's pulse ox)    Exercises Other Exercises Other Exercises: pt/son educated in basic energy conservation principles to support pt's safe participation in daily routines, handout provided. Additional training/education will be beneficial to support recall and carryover with implementation   Shoulder Instructions      Home Living Family/patient expects to be discharged to:: Private residence Living Arrangements: Children(son and daughter) Available Help at Discharge: Family;Available 24 hours/day(son is pt's aide) Type of Home: House Home Access:  Stairs to enter [employer] of Steps: 3 Entrance Stairs-Rails: Right(ascending) Home Layout: One level     Bathroom Shower/Tub: Teacher, early years/pre: Handicapped height     Home Equipment: Environmental consultant - 2 wheels;Walker - 4 wheels;Toilet riser;Shower seat;Other (comment)(bed rails)   Additional Comments: Per son, he is going to attempt to have someone come to the home and install a ramp for improved access      Prior Functioning/Environment Level of Independence: Needs assistance  Gait / Transfers Assistance Needed: Supervision with RW for transfers and mobility, household and community distances (on a daily basis with son/daughter) ADL's / Homemaking Assistance Needed: family assists with ADLs and household chores/responsibiltiies as needed. No home O2.    Comments:  Endorses single fall within previous six months.        OT Problem List: Decreased strength;Decreased knowledge of use of DME or AE;Cardiopulmonary status limiting activity;Decreased activity tolerance;Impaired balance (sitting and/or standing);Decreased safety awareness      OT Treatment/Interventions: Self-care/ADL training;Balance training;Therapeutic exercise;Therapeutic activities;Energy conservation;DME and/or AE instruction;Cognitive remediation/compensation;Patient/family education    OT Goals(Current goals can be found in the care plan section) Acute Rehab OT Goals Patient Stated Goal: go home OT Goal Formulation: With patient/family Time For Goal Achievement: 03/21/18 Potential to Achieve Goals: Good  OT Frequency: Min 1X/week   Barriers to D/C:            Co-evaluation              AM-PAC PT "6 Clicks" Daily Activity  Outcome Measure Help from another person eating meals?: A Little Help from another person taking care of personal grooming?: A Little Help from another person toileting, which includes using toliet, bedpan, or urinal?: A Lot Help from another person  bathing (including washing, rinsing, drying)?: A Lot Help from another person to put on and taking off regular upper body clothing?: A Lot Help from another person to put on and taking off regular lower body clothing?: A Lot 6 Click Score: 14   End of Session Nurse Communication: Other (comment)(O2 sats discrepancies )  Activity Tolerance: Patient tolerated treatment well Patient left: in chair;with call bell/phone within reach;with family/visitor present  OT Visit Diagnosis: Other abnormalities of gait and mobility (R26.89);History of falling (Z91.81);Muscle weakness (generalized) (M62.81)                Time: 1355-1440 OT Time Calculation (min): 45 min Charges:  OT General Charges $OT Visit: 1 Visit OT Evaluation $OT Eval Moderate Complexity: 1 Mod OT Treatments $Self Care/Home Management : 8-22 mins  Jeni Salles, MPH, MS, OTR/L ascom [phone] 03/07/18, 3:34 PM

## 2018-03-07 NOTE — Care Management (Signed)
Results for Brianna Coleman, [facility] (MRN [id number]) as of 03/07/2018 14:14  Ref. Range 03/03/2018 23:36  Hepatitis B Surface Ag Latest Ref Range: Negative  Negative  Hep B S Ab Unknown Non Reactive  Hep B Core Ab, Tot Latest Ref Range: Negative  Negative    Brianna Coleman required intubation after respiratory arrest on 03/03/2018.  Brianna Coleman extubated 9/14 and currently on nasal cannula at 4 liters. Palliative is assisting with goals of care. It is reported that Brianna Coleman will require dialysis post discharge.  She has not sat for dialysis. PT and OT consults pending.  Brianna Coleman is a full code.  Brianna Coleman is now step down status and anticipate transfer out of of icu within next 24 hours.  Elvera Bicker with [facility] is working on new HD referral.  Have PPD results and hepatitis screen results.

## 2018-03-08 ENCOUNTER — Inpatient Hospital Stay: Payer: Self-pay

## 2018-03-08 DIAGNOSIS — G20 Parkinson's disease: Secondary | ICD-10-CM

## 2018-03-08 DIAGNOSIS — E872 Acidosis, unspecified: Secondary | ICD-10-CM

## 2018-03-08 DIAGNOSIS — N186 End stage renal disease: Secondary | ICD-10-CM

## 2018-03-08 LAB — GLUCOSE, CAPILLARY
GLUCOSE-CAPILLARY: 86 mg/dL (ref 70–99)
GLUCOSE-CAPILLARY: 99 mg/dL (ref 70–99)
Glucose-Capillary: 80 mg/dL (ref 70–99)
Glucose-Capillary: 89 mg/dL (ref 70–99)
Glucose-Capillary: 87 mg/dL (ref 70–99)
Glucose-Capillary: 101 mg/dL — ABNORMAL HIGH (ref 70–99)

## 2018-03-08 LAB — BASIC METABOLIC PANEL
Anion gap: 6 (ref 5–15)
BUN: 40 mg/dL — ABNORMAL HIGH (ref 8–23)
CO2: 27 mmol/L (ref 22–32)
CREATININE: 2.5 mg/dL — AB (ref 0.44–1.00)
Calcium: 8.1 mg/dL — ABNORMAL LOW (ref 8.9–10.3)
Chloride: 109 mmol/L (ref 98–111)
GFR calc Af Amer: 20 mL/min — ABNORMAL LOW (ref >60)
GFR calc non Af Amer: 17 mL/min — ABNORMAL LOW (ref >60)
GLUCOSE: 113 mg/dL — AB (ref 70–99)
POTASSIUM: 3.8 mmol/L (ref 3.5–5.1)
Sodium: 142 mmol/L (ref 135–145)

## 2018-03-08 LAB — CBC WITH DIFFERENTIAL/PLATELET
Basophils Absolute: 0 10*3/uL (ref 0–0.1)
Basophils Relative: 1 %
Eosinophils Absolute: 0.1 10*3/uL (ref 0–0.7)
Eosinophils Relative: 2 %
HEMATOCRIT: 20.7 % — AB (ref 35.0–47.0)
Hemoglobin: 7 g/dL — ABNORMAL LOW (ref 12.0–16.0)
LYMPHS ABS: 0.6 10*3/uL — AB (ref 1.0–3.6)
LYMPHS PCT: 14 %
MCH: 32.1 pg (ref 26.0–34.0)
MCHC: 33.9 g/dL (ref 32.0–36.0)
MCV: 94.8 fL (ref 80.0–100.0)
MONOS PCT: 8 %
Monocytes Absolute: 0.4 10*3/uL (ref 0.2–0.9)
NEUTROS ABS: 3.4 10*3/uL (ref 1.4–6.5)
Neutrophils Relative %: 75 %
Platelets: 135 10*3/uL — ABNORMAL LOW (ref 150–440)
RBC: 2.18 MIL/uL — AB (ref 3.80–5.20)
RDW: 16 % — ABNORMAL HIGH (ref 11.5–14.5)
WBC: 4.6 10*3/uL (ref 3.6–11.0)

## 2018-03-08 LAB — PHOSPHORUS: Phosphorus: 3.3 mg/dL (ref 2.5–4.6)

## 2018-03-08 MED ORDER — ACETAMINOPHEN 325 MG PO TABS: 650.0000 mg | ORAL_TABLET | ORAL | Status: DC | PRN

## 2018-03-08 MED ORDER — DOPAMINE-DEXTROSE 1.6-5 MG/ML-% IV SOLN: 2.0000 ug/kg/min | Freq: Once | INTRAVENOUS | Status: DC

## 2018-03-08 MED ORDER — DOCUSATE SODIUM 50 MG/5ML PO LIQD: 100.0000 mg | Freq: Two times a day (BID) | ORAL | Status: DC | PRN

## 2018-03-08 MED ORDER — SENNOSIDES-DOCUSATE SODIUM 8.6-50 MG PO TABS
1.0000 | ORAL_TABLET | Freq: Two times a day (BID) | ORAL | Status: DC
  Administered 2018-03-08 – 2018-03-09 (×3): 1 via ORAL
  Filled 2018-03-08 (×4): qty 1

## 2018-03-08 MED ORDER — PRAMIPEXOLE DIHYDROCHLORIDE 1 MG PO TABS
1.0000 mg | ORAL_TABLET | Freq: Three times a day (TID) | ORAL | Status: DC
Start: 2018-03-08 — End: 2018-03-10
  Administered 2018-03-08 – 2018-03-09 (×4): 1 mg via ORAL
  Filled 2018-03-08 (×8): qty 1

## 2018-03-08 MED ORDER — SENNOSIDES-DOCUSATE SODIUM 8.6-50 MG PO TABS
1.0000 | ORAL_TABLET | Freq: Two times a day (BID) | ORAL | Status: DC
  Administered 2018-03-08: 1

## 2018-03-08 MED ORDER — MORPHINE SULFATE (PF) 2 MG/ML IV SOLN
1.0000 mg | INTRAVENOUS | Status: DC | PRN
  Administered 2018-03-08: 2 mg via INTRAVENOUS
  Administered 2018-03-08: 1 mg via INTRAVENOUS
  Administered 2018-03-09 – 2018-03-10 (×4): 2 mg via INTRAVENOUS
  Filled 2018-03-08 (×6): qty 1

## 2018-03-08 MED ORDER — MORPHINE SULFATE (PF) 2 MG/ML IV SOLN
2.0000 mg | Freq: Once | INTRAVENOUS | Status: AC
  Administered 2018-03-08: 2 mg via INTRAVENOUS
  Filled 2018-03-08: qty 1

## 2018-03-08 MED ORDER — ASPIRIN 81 MG PO CHEW
81.0000 mg | CHEWABLE_TABLET | Freq: Every day | ORAL | Status: DC
  Administered 2018-03-09: 81 mg via ORAL
  Filled 2018-03-08 (×2): qty 1

## 2018-03-08 MED ORDER — EPOETIN ALFA 10000 UNIT/ML IJ SOLN
10000.0000 [IU] | INTRAMUSCULAR | Status: DC
  Administered 2018-03-10: 10000 [IU] via INTRAVENOUS
  Filled 2018-03-08: qty 1

## 2018-03-08 MED ORDER — ATORVASTATIN CALCIUM 20 MG PO TABS
80.0000 mg | ORAL_TABLET | Freq: Every evening | ORAL | Status: DC
Start: 2018-03-08 — End: 2018-03-10
  Administered 2018-03-08: 80 mg via ORAL

## 2018-03-08 MED ORDER — CLOPIDOGREL BISULFATE 75 MG PO TABS
75.0000 mg | ORAL_TABLET | Freq: Every day | ORAL | Status: DC
  Administered 2018-03-09: 75 mg via ORAL
  Filled 2018-03-08 (×2): qty 1

## 2018-03-08 MED ORDER — CARBIDOPA-LEVODOPA 25-100 MG PO TABS
1.0000 | ORAL_TABLET | Freq: Four times a day (QID) | ORAL | Status: DC
  Administered 2018-03-08 – 2018-03-09 (×5): 1 via ORAL
  Filled 2018-03-08 (×10): qty 1

## 2018-03-08 MED ORDER — POLYETHYLENE GLYCOL 3350 17 G PO PACK: 17.0000 g | PACK | Freq: Once | ORAL | Status: DC

## 2018-03-08 NOTE — Progress Notes (Signed)
HD initiated without issue via R IJ HD catheter. Labs resulted this am. 3k bath. Patient with mild shortness of breath. Primary RN administering breathing tx. Daughter at bedside. Dr. Juleen China at bedside. Order to increase UF goal as tolerated for fluid removal.

## 2018-03-08 NOTE — Progress Notes (Addendum)
Daily Progress Note   Patient Name: Brianna Coleman       Date: 03/08/2018 DOB: 1940/11/26  Age: 77 y.o. MRN#: [id number] Attending Physician: Fritzi Mandes, MD Primary Care Physician: [place] Pink, MD Admit Date: 02/20/2018  Reason for Consultation/Follow-up: Establishing goals of care, psychosocial support.   Subjective: Patient is resting in bed with eyes closed, dialysis in place. Her daughter Brianna Coleman who is POA is at bedside. She states they are hopeful she can continue hemodialysis with conversion to peritoneal dialysis at a later time. She states they have discussed [place] with CCM and would like to continue full scope up to the point of a tracheostomy stating she would never want that. Brianna Coleman states her mother would want to be reintubated if indicated. She would want to remain a full code and have pressors if needed.   Length of Stay: 9  Current Medications: Scheduled Meds:  . aspirin  81 mg Per Tube Daily  . atorvastatin  80 mg Per Tube QPM  . carbidopa-levodopa  1 tablet Per Tube QID  . Chlorhexidine Gluconate Cloth  6 each Topical Q0600  . clopidogrel  75 mg Per Tube Daily  . feeding supplement (NEPRO CARB STEADY)  237 mL Oral TID BM  . heparin injection (subcutaneous)  5,000 Units Subcutaneous Q8H  . mouth rinse  15 mL Mouth Rinse BID  . multivitamin  1 tablet Oral QHS  . pantoprazole (PROTONIX) IV  40 mg Intravenous Q12H  . pramipexole  1 mg Per Tube TID  . sodium chloride flush  10-40 mL Intracatheter Q12H    Continuous Infusions: . sodium chloride Stopped (03/08/18 0443)  . sodium chloride    . sodium chloride    . dextrose 5 % and 0.45% NaCl 50 mL/hr at 03/08/18 0800  . DOPamine Stopped (03/06/18 1724)    PRN Meds: sodium chloride, sodium chloride, sodium  chloride, acetaminophen, alteplase, docusate, ipratropium-albuterol, lidocaine (PF), lidocaine-prilocaine, morphine injection, pentafluoroprop-tetrafluoroeth, sodium chloride flush  Physical Exam  Constitutional: No distress.  Pulmonary/Chest:  Extubated  Neurological:  Resting with eyes closed. [place] removed for the time she is under dialysis care.             Vital Signs: BP 102/64 (BP Location: Left Arm)   Pulse 84   Temp (!) 97 F (36.1 C) (Oral)  Resp (!) 21   Ht 5\' 3"  (1.6 m)   Wt 60.7 kg   SpO2 100%   BMI 23.71 kg/m  SpO2: SpO2: 100 % O2 Device: O2 Device: Nasal Cannula O2 Flow Rate: O2 Flow Rate (L/min): 3 L/min  Intake/output summary:   Intake/Output Summary (Last 24 hours) at 03/08/2018 0931 Last data filed at 03/08/2018 0800 Gross per 24 hour  Intake 1155.29 ml  Output 210 ml  Net 945.29 ml   LBM: Last BM Date: 03/03/18 Baseline Weight: Weight: 55.3 kg Most recent weight: Weight: 60.7 kg       Palliative Assessment/Data:       Patient Active Problem List   Diagnosis Date Noted  . Protein-calorie malnutrition, severe 03/01/2018  . Cardiac arrest ([place]) 03/03/2018  . Acute renal failure superimposed on chronic kidney disease ([place]) 03/19/2018  . Anemia in stage 4 chronic kidney disease ([place]) 11/08/2017  . Heart murmur 11/08/2017  . 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 Anemia 07/30/2017  . Orthostatic hypotension 06/17/2017  . TIA (transient ischemic attack) 05/24/2017  . Hallucinations 04/26/2017  . Apraxia 12/21/2016  . Cervicogenic headache 02/05/2016  . Stroke due to embolism of right carotid artery ([place]) 09/06/2015  . Cerebrovascular accident (CVA) ([place]) 08/12/2015  . Transient alteration of awareness 07/31/2015  . 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 Absolute anemia 01/26/2014  . Aortic valve defect 01/26/2014  . Congestive heart failure ([place]) 01/26/2014  . HLD (hyperlipidemia) 01/26/2014  . Pure hypercholesterolemia  01/26/2014  . Avitaminosis D 01/26/2014  . Carotid artery stenosis 09/19/2013  . Carotid artery obstruction 09/19/2013  . Occlusion and stenosis of carotid artery without mention of cerebral infarction 09/14/2013  . Paralysis agitans ([place]) 10/04/2012  . Abnormality of gait 10/04/2012  . Parkinson's disease ([place]) 10/04/2012  . Memory loss 06/21/2012  . Dysphagia, unspecified(787.20) 06/21/2012  . Unspecified essential hypertension 06/21/2012  . Other and unspecified hyperlipidemia 06/21/2012  . Unspecified cataract 06/21/2012  . Cataract 06/21/2012  . Can't get food down 06/21/2012  . Essential (primary) hypertension 06/21/2012    Palliative Care Assessment & Plan    Assessment: Extubated. hemodialysis.    Recommendations/Plan:  Continue agressive care. No trach.    Code Status:    Code Status Orders  (From admission, onward)         Start     Ordered   02/28/2018 2239  Full code  Continuous     03/07/2018 2238        Code Status History    Date Active Date Inactive Code Status Order ID Comments User Context   05/24/2017 1730 05/25/2017 2110 Full Code [id number]  Dustin Flock, MD ED   09/19/2013 1721 09/20/2013 1700 Full Code [id number]  Gabriel Earing, PA-C Inpatient    Advance Directive Documentation     Most Recent Value  Type of Advance Directive  Healthcare Power of Attorney  Pre-existing out of facility DNR order (yellow form or pink MOST form)  -  "MOST" Form in Place?  -       Prognosis:  Poor. Cardiac arrest with resusitation on admission. Respiratory arrest 9/13. Initiation of dialysis this admissions. Parkinson's.   Discharge Planning:  To Be Determined  Care plan was discussed with RN  Thank you for allowing the Palliative Medicine Team to assist in the care of this patient.   Time In: 9:55 Time Out: 10:10 Total Time 15 min Prolonged Time Billed  no      Greater than 50%  of  this time was spent counseling and coordinating care related to  the above assessment and plan.  Asencion Gowda, NP  Please contact Palliative Medicine Team phone at [phone] for questions and concerns.

## 2018-03-08 NOTE — Progress Notes (Signed)
   CHIEF COMPLAINT:   Chief Complaint  Patient presents with  . Cardiac Arrest    Subjective  Placed on biPAP Increased WOB Hypoxia Plan to wean off biPAP fio2 as needed     ROS limited due to lethargy and on biPAP/critical illness   Objective   Examination:  General exam: ill appearing Respiratory system: rhonchi increased WOB HEENT: [place]/AT, PERRLA, no thrush, no stridor. Cardiovascular system: S1 & S2 heard, RRR. No JVD, murmurs, rubs, gallops or clicks. No pedal edema. Gastrointestinal system: Abdomen is nondistended, soft and nontender. No organomegaly or masses felt. Normal bowel sounds heard. Central nervous system:disoriented Skin: No rashes, lesions or ulcers Psychiatry: Judgement and insight poor  VITALS:  height is 5\' 3"  (1.6 m) and weight is 60.7 kg. Her oral temperature is 97 F (36.1 C) (abnormal). Her blood pressure is 115/62 and her pulse is 87. Her respiration is 26 (abnormal) and oxygen saturation is 99%.   I personally reviewed Labs under Results section.   Assessment/Plan:  77 yo WF Status post respiratory and cardiac arrest now 77 requiring intubation, mechanical ventilation. Chest x-ray reveals right lower lung field airspace disease, large hiatal hernia.   Post extubation required BiPAP last night for increased work of breathing, also had difficulty with secretion clearance.   Has  severe Parkinson's, swallowing dysfunction, hiatal hernia and altered mental status she has difficulty with secretion clearance potentially aspirate or mucous plugs and and suffered cardiac arrest from her hemodynamic instability from her Parkinson's  Severe Hypoxic and Hypercapnic Respiratory Failure biPAP as needed fio2 as needed  CARDIAC FAILURE- -oxygen as needed -Lasix as tolerated  Remains SD status Progressive Parkinsons disease  Poor prognosis Palliative care following    Critical Care Time devoted to patient care services described in this note is  36 minutes.   Overall, patient is critically ill, prognosis is guarded.  Patient with Multiorgan failure and at high risk for cardiac arrest and death.    Corrin Parker, M.D.  [facility]  Medical Director [place] [facility] Cardio-Pulmonary Department

## 2018-03-08 NOTE — Progress Notes (Signed)
Discussed with patient's son concerning ICU delirium causes and interventions.  We spoke about keeping the lights on and keeping the patient awake during the day.  The son prefers the patient to sleep when she is "tired" despite the time of day.  Will provide written education.

## 2018-03-08 NOTE — Progress Notes (Addendum)
Constipation Note  Assessment 77 year old female with last bowel movement on 9/12. Pharmacy to manage bowel regimen.  Plan Will give Miralax 17g packet once. Will start sennosides-docusate sodium 8.6-50mg  tablet scheduled twice daily.  Will reassess patient daily.  Janey Greaser Jessejames Steelman, PharmD Candidate 03/08/2018 1:33 PM

## 2018-03-08 NOTE — Progress Notes (Signed)
[place] [facility] [place] NAME: Brianna Coleman    MR#:  [id number]  DATE OF BIRTH:  02-26-1941  SUBJECTIVE:   Patient sitting out in the chair. Son in the room. Awake and alert today. Not able to give any review of systems. Any complaints. REVIEW OF SYSTEMS:   Review of Systems  Unable to perform ROS: Medical condition   Tolerating Diet:soft Tolerating PT: evaluation noted  DRUG ALLERGIES:   Allergies  Allergen Reactions  . Levophed [Norepinephrine Bitartrate] Other (See Comments)    Pt with significant bradycardia.  Also EKG changes with inverted P wave and T wave morphology change    VITALS:  Blood pressure (!) 132/99, pulse 87, temperature (!) 97.1 F (36.2 C), temperature source Oral, resp. rate (!) 22, height 5\' 3"  (1.6 m), weight 59.7 kg, SpO2 100 %.  PHYSICAL EXAMINATION:   Physical Exam  GENERAL:  77 y.o.-year-old patient lying in the bed with no acute distress. thin EYES: Pupils equal, round, reactive to light and accommodation. No scleral icterus. Extraocular muscles intact.  HEENT: Head atraumatic, normocephalic. Oropharynx and nasopharynx clear.  NECK:  Supple, no jugular venous distention. No thyroid enlargement, no tenderness.  LUNGS:decreased breath sounds bilaterally, no wheezing, rales, rhonchi. No use of accessory muscles of respiration.  Right sided perm cath+ CARDIOVASCULAR: S1, S2 normal. No murmurs, rubs, or gallops.  ABDOMEN: Soft, nontender, nondistended. Bowel sounds present. No organomegaly or mass.  EXTREMITIES: No cyanosis, clubbing or edema b/l.    NEUROLOGIC: moves spontaneously, no focal weakness PSYCHIATRIC:  patient is alert and awake SKIN: No obvious rash, lesion, or ulcer.   LABORATORY PANEL:  CBC Recent Labs  Lab 03/08/18 0429  WBC 4.6  HGB 7.0*  HCT 20.7*  PLT 135*    Chemistries  Recent Labs  Lab 03/06/18 2000  03/08/18 0429  NA 138   < > 142  K 3.3*   < > 3.8  CL 105   < >  109  CO2 28   < > 27  GLUCOSE 104*   < > 113*  BUN 38*   < > 40*  CREATININE 2.47*   < > 2.50*  CALCIUM 8.1*   < > 8.1*  MG 2.1  --   --   AST 20  --   --   ALT 8  --   --   ALKPHOS 56  --   --   BILITOT 0.6  --   --    < > = values in this interval not displayed.   Cardiac Enzymes Recent Labs  Lab 03/04/18 1750  TROPONINI 0.71*   RADIOLOGY:  [place] Ekg Site Rite  Result Date: 03/08/2018 If Site Rite image not attached, placement could not be confirmed due to current cardiac rhythm.  ASSESSMENT AND PLAN:  77 y.o. white female with hypertension, Parkinson's disorder, GI bleed, overactive bladder, iron deficiency anemia, hyperlipidemia, chronic musculoskeletal low back pain, chronic lower extremity edema, GERD, right basal ganglia CVA and vitamin D deficiency admitted with cardiac arrest.   *Acute hypoxic respiratory failure Intubated overnight--now extubated today.   On RA  * Cardiac arrest ([place])  - Echo Left ventricle: The cavity size was normal. Systolic function was   normal. The estimated ejection fraction was in the range of 55%   to 60%. - Aortic valve: Bicuspid; mildly thickened, mildly calcified   leaflets. There was moderate stenosis. Valve area (VTI): 0.94   cm^2. Valve area (Vmax): 1.45  cm^2. Valve area (Vmean): 1.73   cm^2. - Mitral valve: There was mild regurgitation. 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 Hepatitis panel negative  *Hypotension with h/o Essential (primary) hypertension - monitor in ICU Blood pressure is much better today  *Parkinson's disease ([place]) - Home dose Sinemet  *HLD (hyperlipidemia) - Home dose antilipid   Palliative care c/s -  Plan of care discussed with  son at bedside. She is not appropriate for any kind of Rehab - no STR/SNF -son is in agreement. They would like to take her home with home health [facility]). Son stays home to take care of patient.  Outpatient dialysis chair time to be obtained. This was discussed with nephrology. Work in progress.  discussed with Care Management/Social Worker. Management plans discussed with the patient, family and they are in agreement.  CODE STATUS: full  DVT Prophylaxis: lovenox  TOTAL TIME TAKING CARE OF THIS PATIENT: *30* minutes.  >50% time spent on counselling and coordination of care  POSSIBLE D/C IN *few* DAYS, DEPENDING ON CLINICAL CONDITION.  Note: This dictation was prepared with Dragon dictation along with smaller phrase technology. Any transcriptional errors that result from this process are unintentional.  Fritzi Mandes M.D on 03/08/2018 at 2:33 PM  Between 7am to 6pm - Pager - [phone]  After 6pm go to [email or web address] - password [email or web address] [facility]  Office  [phone]  CC: Primary care physician; [place] Pink, MDPatient ID: Brianna Coleman, female   DOB: 1941/02/10, 77 y.o.   MRN: [id number]

## 2018-03-08 NOTE — Progress Notes (Signed)
PT Cancellation Note  Patient Details Name: Brianna Coleman MRN: [id number] DOB: Apr 14, 1941   Cancelled Treatment:    Reason Eval/Treat Not Completed: Other (comment).  Pt currently receiving dialysis.  Pt's Hgb also noted to be 7.0 this morning (per nurse no plans for blood transfusion at this time).  Will re-attempt PT treatment session at a later date/time.  Leitha Bleak, PT 03/08/18, 10:08 AM [phone]

## 2018-03-08 NOTE — Progress Notes (Signed)
Hemodialysis completed. Total UF 1L today. UF limited by hypotension x2 where patient exhibited restlessness; relieved with saline bolus. All vitals stable post treatment. Family at bedside. Patient alert. Eating. Report given to primary RN.

## 2018-03-08 NOTE — Progress Notes (Signed)
PT Cancellation Note  Patient Details Name: Brianna Coleman MRN: [id number] DOB: [birth year]   Cancelled Treatment:    Reason Eval/Treat Not Completed: Other (comment);Patient's level of consciousness. Treatment attempted for appreciable time with discussion with son regarding pt's state (illness, respiratory distress and confusion/anxiety). Pt communicates with unrelated/confused responses to questions/comments, but communicates with comprehensible words. Pt given towel upon request in hopes to calm anxious nature at this time. Pt repositioned for improved comfort. Son also notes discontent with overall care in the pt's current hospital room/floor. Offered son ability to speak with a Librarian, academic, which son states he already has without positive result. Offered pt any guidance I could give to speak with someone regarding his concerns; pt states multiple times that it will not make a difference. Spoke to Acute Care manager regarding who states she will forward concerns to directors of ICU. Son notes he would like PT to attempt again tomorrow, as pt did enjoy sitting in the chair previously. Re attempt tomorrow.    Arnette Norris 03/08/2018, 3:06 PM

## 2018-03-08 NOTE — Progress Notes (Signed)
OT Cancellation Note  Patient Details Name: Brianna Coleman MRN: [id number] DOB: September 23, 1940   Cancelled Treatment:    Reason Eval/Treat Not Completed: Patient at procedure or test/ unavailable. Pt receiving dialysis via R IJ cath. Hgb also noted to be 7.0 this am. Per PT/RN, no plans for blood transfusion at this time. Will re-attempt OT tx at later date/time as pt is available and medically appropriate.   Jeni Salles, MPH, MS, OTR/L ascom [phone] 03/08/18, 10:26 AM

## 2018-03-08 NOTE — Progress Notes (Signed)
IV team called regarding PICC line placement order. Leda Gauze, RN stated that they do not place PICC lines in patients receiving HD due to nephrology not wanting the patient's veins to get "messed up." MD and family notified.

## 2018-03-08 NOTE — Progress Notes (Signed)
Spoke to Primary RN re: PICC placement order, patient is acute renal failure and currently on dialysis.

## 2018-03-08 NOTE — Progress Notes (Signed)
Pre HD  

## 2018-03-08 NOTE — Progress Notes (Signed)
[facility]  ROUNDING NOTE   Subjective:   Daughter at bedside  Seen and examined on intermittent hemodialysis. Tolerating treatment well. UF of 1 liter.    Objective:  Vital signs in last 24 hours:  Temp:  [97 F (36.1 C)-99.1 F (37.3 C)] 97.1 F (36.2 C) (09/17 1206) Pulse Rate:  [66-112] 87 (09/17 1217) Resp:  [15-26] 22 (09/17 1217) BP: (79-161)/(34-99) 132/99 (09/17 1217) SpO2:  [77 %-100 %] 100 % (09/17 1217) FiO2 (%):  [45 %] 45 % (09/17 0600) Weight:  [59.7 kg-60.7 kg] 59.7 kg (09/17 1206)  Weight change: 2.5 kg Filed Weights   03/08/18 0500 03/08/18 0905 03/08/18 1206  Weight: 60.7 kg 60.7 kg 59.7 kg    Intake/Output: I/O last 3 completed shifts: In: 1443.7 [I.V.:1219.4; IV Piggyback:224.2] Out: 460 [Urine:460]   Intake/Output this shift:  Total I/O In: 350.1 [P.O.:240; I.V.:110.1] Out: 1000 [Other:1000]  Physical Exam: General: No acute distress  Head: Normocephalic, atraumatic.  Oral mucosa moist  Eyes: Anicteric  Neck: Supple, trachea midline  Lungs:  Scattered rhonchi  Heart: S1S2 no rubs  Abdomen:  Soft, nontender, bowel sounds present  Extremities: No peripheral edema.  Neurologic: Confused.   Skin: No lesions  Access:  R IJ permcath    Basic Metabolic Panel: Recent Labs  Lab 03/18/2018 0829 03/03/18 2336 03/05/18 0853 03/06/18 0018 03/06/18 2000 03/07/18 0529 03/08/18 0120 03/08/18 0429  NA 147* 145 144 142 138 141  --  142  K 3.7 3.6 3.0* 3.0* 3.3* 3.5  --  3.8  CL 113* 112* 110 109 105 108  --  109  CO2 25 26 26 27 28 28   --  27  GLUCOSE 120* 130* 119* 120* 104* 104*  --  113*  BUN 88* 97* 68* 32* 38* 38*  --  40*  CREATININE 4.54* 4.31* 3.06* 1.97* 2.47* 2.43*  --  2.50*  CALCIUM 8.4* 8.3* 7.9* 7.5* 8.1* 7.8*  --  8.1*  MG  --  2.7*  --   --  2.1  --   --   --   PHOS 5.1* 3.5  --  2.5 4.0  --  3.3  --     Liver Function Tests: Recent Labs  Lab 03/09/2018 0829 03/03/18 2336 03/06/18 0018 03/06/18 2000  AST   --  52*  --  20  ALT  --  7  --  8  ALKPHOS  --  74  --  56  BILITOT  --  1.0  --  0.6  PROT  --  5.4*  --  4.7*  ALBUMIN 3.0* 2.8* 2.3* 2.3*   No results for input(s): LIPASE, AMYLASE in the last 168 hours. No results for input(s): AMMONIA in the last 168 hours.  CBC: Recent Labs  Lab 03/03/18 2336 03/04/18 0353 03/04/18 1319 03/05/18 0853 03/06/18 1134 03/06/18 2000 03/08/18 0429  WBC 9.1 8.0  --  5.2 4.8 4.1 4.6  NEUTROABS 8.1*  --   --  4.0 3.9  --  3.4  HGB 8.0* 7.4* 8.6* 7.6* 7.4* 7.4* 7.0*  HCT 24.3* 22.0*  --  22.4* 21.9* 21.7* 20.7*  MCV 95.5 95.4  --  93.4 94.6 93.7 94.8  PLT 111* 104*  --  110* 113* 110* 135*    Cardiac Enzymes: Recent Labs  Lab 03/03/18 2336 03/04/18 1750  TROPONINI 0.82* 0.71*    BNP: Invalid input(s): POCBNP  CBG: Recent Labs  Lab 03/07/18 1954 03/07/18 2328 03/08/18 0321 03/08/18 0744 03/08/18  [place] 80 89 87    Microbiology: Results for orders placed or performed during the hospital encounter of 02/20/2018  Blood culture (routine x 2)     Status: None   Collection Time: 03/06/2018  7:54 PM  Result Value Ref Range Status   Specimen Description BLOOD RIGHT WRIST  Final   Special Requests   Final    BOTTLES DRAWN AEROBIC AND ANAEROBIC Blood Culture results may not be optimal due to an excessive volume of blood received in culture bottles   Culture   Final    NO GROWTH 5 DAYS Performed at [facility], [address] [place], [place] [address]    Report Status 03/04/2018 FINAL  Final  MRSA PCR Screening     Status: None   Collection Time: 03/18/2018 10:39 PM  Result Value Ref Range Status   MRSA by PCR NEGATIVE NEGATIVE Final    Comment:        The GeneXpert MRSA Assay (FDA approved for NASAL specimens only), is one component of a comprehensive MRSA colonization surveillance program. It is not intended to diagnose MRSA infection nor to guide or monitor treatment for MRSA  infections. Performed at [facility], [address] [place], [place] [address]   Blood culture (routine x 2)     Status: None   Collection Time: 02/25/2018 11:12 PM  Result Value Ref Range Status   Specimen Description BLOOD LEFT ARM  Final   Special Requests   Final    BOTTLES DRAWN AEROBIC AND ANAEROBIC Blood Culture results may not be optimal due to an inadequate volume of blood received in culture bottles   Culture   Final    NO GROWTH 5 DAYS Performed at [facility], [address] [place], [place] [address]    Report Status 03/04/2018 FINAL  Final    Coagulation Studies: No results for input(s): LABPROT, INR in the last 72 hours.  Urinalysis: No results for input(s): COLORURINE, LABSPEC, PHURINE, GLUCOSEU, HGBUR, BILIRUBINUR, KETONESUR, PROTEINUR, UROBILINOGEN, NITRITE, LEUKOCYTESUR in the last 72 hours.  Invalid input(s): APPERANCEUR    Imaging: [place] Ekg Site Rite  Result Date: 03/08/2018 If Site Rite image not attached, placement could not be confirmed due to current cardiac rhythm.    Medications:   . sodium chloride Stopped (03/08/18 0443)  . sodium chloride    . sodium chloride    . dextrose 5 % and 0.45% NaCl 50 mL/hr at 03/08/18 0800  . DOPamine Stopped (03/06/18 1724)   . aspirin  81 mg Per Tube Daily  . atorvastatin  80 mg Per Tube QPM  . carbidopa-levodopa  1 tablet Per Tube QID  . Chlorhexidine Gluconate Cloth  6 each Topical Q0600  . clopidogrel  75 mg Per Tube Daily  . feeding supplement (NEPRO CARB STEADY)  237 mL Oral TID BM  . heparin injection (subcutaneous)  5,000 Units Subcutaneous Q8H  . mouth rinse  15 mL Mouth Rinse BID  . multivitamin  1 tablet Oral QHS  . pantoprazole (PROTONIX) IV  40 mg Intravenous Q12H  . polyethylene glycol  17 g Per Tube Once  . pramipexole  1 mg Per Tube TID  . senna-docusate  1 tablet Per Tube BID  . sodium chloride flush  10-40 mL Intracatheter Q12H   sodium chloride, sodium chloride,  sodium chloride, acetaminophen, alteplase, docusate, ipratropium-albuterol, lidocaine (PF), lidocaine-prilocaine, morphine injection, pentafluoroprop-tetrafluoroeth, sodium chloride flush  Assessment/ Plan:  77 y.o. white female with  hypertension, Parkinson's disorder, GI bleed, overactive bladder, iron deficiency anemia, hyperlipidemia, chronic musculoskeletal low back pain, chronic lower extremity edema, GERD, right basal ganglia CVA and vitamin D deficiency admitted with cardiac arrest.   1.  End-stage renal disease. Seen and examined on hemodialysis. TTS schedule.  RIJ permcath placed -   Outpatient planning for [place].  2.  Anemia of chronic kidney disease.  Hemoglobin 7  - EPO will be initiated with next hemodialysis treatment.   3.  Secondary hyperparathyroidism. PTH 88 - low for level of kidney function. Corrected calcium and phosphorus at goal. Not currently on vitamin D agents.   4.  Hypertension: hypotensive during dialysis treatment.  - sinemet .    LOS: 9 Nemiah Bubar 9/17/20192:48 PM

## 2018-03-09 DIAGNOSIS — E43 Unspecified severe protein-calorie malnutrition: Secondary | ICD-10-CM

## 2018-03-09 DIAGNOSIS — J9600 Acute respiratory failure, unspecified whether with hypoxia or hypercapnia: Secondary | ICD-10-CM

## 2018-03-09 LAB — GLUCOSE, CAPILLARY
GLUCOSE-CAPILLARY: 86 mg/dL (ref 70–99)
Glucose-Capillary: 119 mg/dL — ABNORMAL HIGH (ref 70–99)
Glucose-Capillary: 81 mg/dL (ref 70–99)
Glucose-Capillary: 82 mg/dL (ref 70–99)
Glucose-Capillary: 84 mg/dL (ref 70–99)
Glucose-Capillary: 97 mg/dL (ref 70–99)

## 2018-03-09 MED ORDER — STERILE WATER FOR INJECTION IJ SOLN
INTRAMUSCULAR | Status: AC
  Administered 2018-03-09: 10 mL
  Filled 2018-03-09: qty 10

## 2018-03-09 NOTE — Care Management (Addendum)
Outpatient hemodialysis has been set up.  Molli Posey RD TTS 5:45A.  Patient still has not sat for dialysis. Dopamine restarted 9/17 due to hypotension.  Hope to wean off today.  Has not required bipap and foley cath discontinued.

## 2018-03-09 NOTE — Procedures (Signed)
Endotracheal Intubation Procedure Note  Indication for endotracheal intubation: respiratory failure. Airway Assessment: Mallampati Class: II (hard and soft palate, upper portion of tonsils anduvula visible). Sedation: etomidate and fentanyl. Paralytic: rocuronium. Lidocaine: no. Atropine: no. Equipment: glidescope utilized . Cricoid Pressure: no. Number of attempts: 1. ETT location confirmed by by auscultation, by CXR and ETCO2 monitor.  Jahmil Macleod, AGNP  Pulmonary/Critical Care Pager [phone] (please enter 7 digits) PCCM Consult Pager [phone] (please enter 7 digits)  

## 2018-03-09 NOTE — Progress Notes (Signed)
LCSW met with son and completed assessment. Patients family wishes for her to remain at home with in home supports.  Will follow family/Card provided for emotional support  Lexus Shampine [place] LCSW [phone]

## 2018-03-09 NOTE — Progress Notes (Signed)
Constipation Note  Assessment 77 year old female with last bowel movement on 9/18. Pharmacy to manage bowel regimen.  Plan Will continue sennosides-docusate sodium 8.6-50mg  tablet scheduled twice daily.  Will reassess patient daily.  Paulina Fusi, PharmD, BCPS 03/09/2018 1:53 PM

## 2018-03-09 NOTE — Progress Notes (Signed)
[facility]  ROUNDING NOTE   Subjective:   Daughter at bedside  Dopamine started overnight.   Patient continues to be confused.   Hemodialysis treatment yesterday. Tolerated treatment well. UF of 1 liter.    Objective:  Vital signs in last 24 hours:  Temp:  [97.1 F (36.2 C)-98.5 F (36.9 C)] 98.5 F (36.9 C) (09/18 0700) Pulse Rate:  [75-100] 89 (09/18 1000) Resp:  [14-32] 23 (09/18 1000) BP: (79-144)/(42-99) 99/47 (09/18 1000) SpO2:  [81 %-100 %] 100 % (09/18 1000) Weight:  [59.7 kg] 59.7 kg (09/17 1206)  Weight change: 0 kg Filed Weights   03/08/18 0500 03/08/18 0905 03/08/18 1206  Weight: 60.7 kg 60.7 kg 59.7 kg    Intake/Output: I/O last 3 completed shifts: In: 1128.7 [P.O.:240; I.V.:888.7] Out: 1135 [Urine:135; Other:1000]   Intake/Output this shift:  Total I/O In: 279.4 [P.O.:240; I.V.:39.4] Out: -   Physical Exam: General: No acute distress  Head: Normocephalic, atraumatic.  Oral mucosa moist  Eyes: Anicteric  Neck: Supple, trachea midline  Lungs:  Scattered rhonchi  Heart: S1S2 no rubs  Abdomen:  Soft, nontender, bowel sounds present  Extremities: No peripheral edema.  Neurologic: Confused.   Skin: No lesions  Access:  R IJ permcath    Basic Metabolic Panel: Recent Labs  Lab 03/03/18 2336 03/05/18 0853 03/06/18 0018 03/06/18 2000 03/07/18 0529 03/08/18 0120 03/08/18 0429  NA 145 144 142 138 141  --  142  K 3.6 3.0* 3.0* 3.3* 3.5  --  3.8  CL 112* 110 109 105 108  --  109  CO2 26 26 27 28 28   --  27  GLUCOSE 130* 119* 120* 104* 104*  --  113*  BUN 97* 68* 32* 38* 38*  --  40*  CREATININE 4.31* 3.06* 1.97* 2.47* 2.43*  --  2.50*  CALCIUM 8.3* 7.9* 7.5* 8.1* 7.8*  --  8.1*  MG 2.7*  --   --  2.1  --   --   --   PHOS 3.5  --  2.5 4.0  --  3.3  --     Liver Function Tests: Recent Labs  Lab 03/03/18 2336 03/06/18 0018 03/06/18 2000  AST 52*  --  20  ALT 7  --  8  ALKPHOS 74  --  56  BILITOT 1.0  --  0.6  PROT  5.4*  --  4.7*  ALBUMIN 2.8* 2.3* 2.3*   No results for input(s): LIPASE, AMYLASE in the last 168 hours. No results for input(s): AMMONIA in the last 168 hours.  CBC: Recent Labs  Lab 03/03/18 2336 03/04/18 0353 03/04/18 1319 03/05/18 0853 03/06/18 1134 03/06/18 2000 03/08/18 0429  WBC 9.1 8.0  --  5.2 4.8 4.1 4.6  NEUTROABS 8.1*  --   --  4.0 3.9  --  3.4  HGB 8.0* 7.4* 8.6* 7.6* 7.4* 7.4* 7.0*  HCT 24.3* 22.0*  --  22.4* 21.9* 21.7* 20.7*  MCV 95.5 95.4  --  93.4 94.6 93.7 94.8  PLT 111* 104*  --  110* 113* 110* 135*    Cardiac Enzymes: Recent Labs  Lab 03/03/18 2336 03/04/18 1750  TROPONINI 0.82* 0.71*    BNP: Invalid input(s): POCBNP  CBG: Recent Labs  Lab 03/08/18 2006 03/08/18 2352 03/09/18 0358 03/09/18 0742 03/09/18 1128  GLUCAP 101* 86 86 84 119*    Microbiology: Results for orders placed or performed during the hospital encounter of 02/24/2018  Blood culture (routine x 2)  Status: None   Collection Time: 03/10/2018  7:54 PM  Result Value Ref Range Status   Specimen Description BLOOD RIGHT WRIST  Final   Special Requests   Final    BOTTLES DRAWN AEROBIC AND ANAEROBIC Blood Culture results may not be optimal due to an excessive volume of blood received in culture bottles   Culture   Final    NO GROWTH 5 DAYS Performed at [facility], [address] [place], [place] [address]    Report Status 03/04/2018 FINAL  Final  MRSA PCR Screening     Status: None   Collection Time: 02/26/2018 10:39 PM  Result Value Ref Range Status   MRSA by PCR NEGATIVE NEGATIVE Final    Comment:        The GeneXpert MRSA Assay (FDA approved for NASAL specimens only), is one component of a comprehensive MRSA colonization surveillance program. It is not intended to diagnose MRSA infection nor to guide or monitor treatment for MRSA infections. Performed at [facility], [address] [place], [place] [address]   Blood culture (routine x 2)      Status: None   Collection Time: 02/21/2018 11:12 PM  Result Value Ref Range Status   Specimen Description BLOOD LEFT ARM  Final   Special Requests   Final    BOTTLES DRAWN AEROBIC AND ANAEROBIC Blood Culture results may not be optimal due to an inadequate volume of blood received in culture bottles   Culture   Final    NO GROWTH 5 DAYS Performed at [facility], [address] [place], [place] [address]    Report Status 03/04/2018 FINAL  Final    Coagulation Studies: No results for input(s): LABPROT, INR in the last 72 hours.  Urinalysis: No results for input(s): COLORURINE, LABSPEC, PHURINE, GLUCOSEU, HGBUR, BILIRUBINUR, KETONESUR, PROTEINUR, UROBILINOGEN, NITRITE, LEUKOCYTESUR in the last 72 hours.  Invalid input(s): APPERANCEUR    Imaging: [place] Ekg Site Rite  Result Date: 03/08/2018 If Site Rite image not attached, placement could not be confirmed due to current cardiac rhythm.    Medications:   . sodium chloride Stopped (03/08/18 0443)  . sodium chloride    . sodium chloride    . dextrose 5 % and 0.45% NaCl 5 mL/hr at 03/09/18 1033  . DOPamine 2 mcg/kg/min (03/09/18 1033)   . aspirin  81 mg Oral Daily  . atorvastatin  80 mg Oral QPM  . carbidopa-levodopa  1 tablet Oral QID  . Chlorhexidine Gluconate Cloth  6 each Topical Q0600  . clopidogrel  75 mg Oral Daily  . [START ON 03/10/2018] epoetin (EPOGEN/PROCRIT) injection  10,000 Units Intravenous Q T,Th,Sa-HD  . feeding supplement (NEPRO CARB STEADY)  237 mL Oral TID BM  . heparin injection (subcutaneous)  5,000 Units Subcutaneous Q8H  . mouth rinse  15 mL Mouth Rinse BID  . multivitamin  1 tablet Oral QHS  . pantoprazole (PROTONIX) IV  40 mg Intravenous Q12H  . polyethylene glycol  17 g Per Tube Once  . pramipexole  1 mg Oral TID  . senna-docusate  1 tablet Oral BID  . sodium chloride flush  10-40 mL Intracatheter Q12H   sodium chloride, sodium chloride, sodium chloride, acetaminophen, alteplase, docusate,  ipratropium-albuterol, lidocaine (PF), lidocaine-prilocaine, morphine injection, pentafluoroprop-tetrafluoroeth, sodium chloride flush  Assessment/ Plan:  77 y.o. white female with hypertension, Parkinson's disorder, GI bleed, overactive bladder, iron deficiency anemia, hyperlipidemia, chronic musculoskeletal low back pain, chronic lower extremity edema, GERD, right basal ganglia CVA  and vitamin D deficiency admitted with cardiac arrest.   1.  End-stage renal disease.  TTS schedule.  -   Outpatient planning for [employer].  2.  Anemia of chronic kidney disease.  Hemoglobin 7  - EPO will hemodialysis treatment.   3.  Secondary hyperparathyroidism. PTH 88 - low for level of kidney function. Corrected calcium and phosphorus at goal. Not currently on vitamin D agents.   4. Hypotension: currently placed on dopamine gtt.    LOS: [place] 9/18/201911:51 AM

## 2018-03-09 NOTE — Clinical Social Work Note (Signed)
Clinical Social Work Assessment  Patient Details  Name: Brianna Coleman MRN: [id number] Date of Birth: 1940-12-28  Date of referral:  03/09/18               Reason for consult:  Facility Placement, Discharge Planning                Permission sought to share information with:  Family Supports Permission granted to share information::  Yes, Verbal Permission Granted  Name::     Brianna Coleman ( son) Personal assistant [place]::     Relationship:: Children    Contact Information:     Housing/Transportation Living arrangements for the past 2 months:  Single Family Home(Resides with son and is attached [facility] 3x week) Source of Information:  Adult Children, Other (Comment Required)(Patient was on Bipap) Patient Interpreter Needed:  None Criminal Activity/Legal Involvement Pertinent to Current Situation/Hospitalization:  No - Comment as needed Significant Relationships:  Adult Children Lives with:  Adult Children Do you feel safe going back to the place where you live?  Yes Need for family participation in patient care:  Yes (Comment)  Care giving concerns: Both her Children- wish for her to remain at home with hired help    Facilities manager / plan: LCSW introduced myself to patient but she was sedated and placed on Bi Pap machine. LCSW spoke to her son who agreed to share information. Patient is retired and widowed x2 and has 2 very involved adult children. As per [name] her son she is to remain at home and additional in home supports will be provided to her. Patient is connected to [facility]  3x week and is on hemo-dialysis. Patient has become very weak and son does agree PT and OT in home will be helpful. He reports he wants to respect his mothers wish to remain at home as long as possible. Patient uses walker and is on a liquid diet /she can aspirate easily. She has Parkinson's so she has poor hand/eye coordination and sundown's in  evening. Patient has to be watched now when she walks with walker as she is very weak.  Patient has in home help before with [facility] and is [facility] eldercare. She will need more assistance with her ADL's however no SNF. LCSW agreed to follow up over next few days with family if the patient needs are changing. Insurance in [employer] care/medicare  Employment status:  Retired Forensic scientist:  ([employer] care/Medicare) PT Recommendations:    Information / Referral to community resources:     Patient/Family's Response to care: Home care NO SNF  Patient/Family's Understanding of and Emotional Response to Diagnosis, Current Treatment, and Prognosis:  Unable to assess patient was resting.  Emotional Assessment Appearance:  Appears stated age Attitude/Demeanor/Rapport:  Unable to Assess(Patient alert and able to nod yes and no) Affect (typically observed):  Accepting Orientation:  Oriented to Self, Oriented to Place Alcohol / Substance use:  Not Applicable Psych involvement (Current and /or in the community):  No (Comment)  Discharge Needs  Concerns to be addressed:  No discharge needs identified Readmission within the last 30 days:  No Current discharge risk:  Chronically ill Barriers to Discharge:  Continued Medical Work up   [place], LCSW 03/09/2018, 7:00 PM

## 2018-03-09 NOTE — Progress Notes (Signed)
OT Cancellation Note  Patient Details Name: Brianna Coleman MRN: [id number] DOB: 18-Sep-1940   Cancelled Treatment:    Reason Eval/Treat Not Completed: Fatigue/lethargy limiting ability to participate;Medical issues which prohibited therapy When OT presented for tx, RN reports pt appeared to have aspirated at lunch, breathing became labored and pt was given Morphine and placed back on BiPap. Pt was very difficult to arouse when OT attempted to communicate with pt. Will f/u on later date as pt becomes more appropriate.   Gerrianne Scale, MS, OTR/L ascom [phone] or [phone] 03/09/18, 5:53 PM

## 2018-03-09 NOTE — Progress Notes (Signed)
Physical Therapy Treatment Patient Details Name: Brianna Coleman MRN: [id number] DOB: 1940/10/05 Today's Date: 03/09/2018    History of Present Illness 77yo female pt presented to ER and admitted secondary to persistent diaphoresis, epigastric discomfort, underwent cardiac arrest with subsequent CPR in ER (requiring BiPAP after ROSC).  Hospital course additionally significant for worsening renal function (R chest perm cath planced 9/11 and dialysis initiated), elevated troponin (peak 4.4) consistent with NSTEMI and repeat cardiac arrest with CPR (requiring intubation 9/12-9/14 after ROSC).  Currently on 4L supplemental O2; awake and oriented to self, location; following commands and eager for OOB efforts.    PT Comments    Pt distressed appearing upon entry with heavy mouth breathing (respirations 25); lethargic/restless. Pt agreeable to PT, "I'll try". During course of treatment session, pt has moments of non relevant verbalization. Active assisted range of motion (limited ability in limited ranges). Pt often withdraws LLE into fetal position. Pt respirations increased with limited work to 33-36 bpm; rest provided with return to baseline. No further attempts for mobility due to pt response. Continue PT to progress participation/tolerance for improved strength/endurance as pt able.    Follow Up Recommendations  SNF     Equipment Recommendations       Recommendations for Other Services       Precautions / Restrictions Precautions Precautions: Fall Restrictions Weight Bearing Restrictions: No    Mobility  Bed Mobility                  Transfers                    Ambulation/Gait                 Stairs             Wheelchair Mobility    Modified Rankin (Stroke Patients Only)       Balance                                            Cognition Arousal/Alertness: Lethargic Behavior During Therapy: Anxious Overall Cognitive  Status: Impaired/Different from baseline                                 General Comments: Pt follows commands intermittently; some verbalization, which is not always relevant. Generally anxious with increasing respiration with very limited mobility attempts. Restless. Mouth breathing. Retracts LEs particularly LLE to fetal position      Exercises General Exercises - Lower Extremity Ankle Circles/Pumps: AAROM;Both;10 reps;Supine Quad Sets: Other (comment)(attempted; unable to comprehend/demonstrate) Short Arc Quad: AAROM;Both;10 reps;Supine Heel Slides: AAROM;Both;10 reps;Supine Hip ABduction/ADduction: AAROM;Both;10 reps;Supine    General Comments        Pertinent Vitals/Pain Pain Assessment: Faces Faces Pain Scale: Hurts whole lot Pain Location: unable to express    Home Living                      Prior Function            PT Goals (current goals can now be found in the care plan section) Progress towards PT goals: Not progressing toward goals - comment    Frequency    Min 2X/week      PT Plan Current plan remains appropriate    Co-evaluation  AM-PAC PT "6 Clicks" Daily Activity  Outcome Measure  Difficulty turning over in bed (including adjusting bedclothes, sheets and blankets)?: Unable Difficulty moving from lying on back to sitting on the side of the bed? : Unable Difficulty sitting down on and standing up from a chair with arms (e.g., wheelchair, bedside commode, etc,.)?: Unable Help needed moving to and from a bed to chair (including a wheelchair)?: Total Help needed walking in hospital room?: Total Help needed climbing 3-5 steps with a railing? : Total 6 Click Score: 6    End of Session Equipment Utilized During Treatment: Oxygen Activity Tolerance: Patient limited by lethargy;Patient limited by pain;Treatment limited secondary to agitation Patient left: in bed;with call bell/phone within reach;with bed alarm  set   PT Visit Diagnosis: Unsteadiness on feet (R26.81);Muscle weakness (generalized) (M62.81);Difficulty in walking, not elsewhere classified (R26.2)     Time: 4076-8088 PT Time Calculation (min) (ACUTE ONLY): 14 min  Charges:  $Therapeutic Exercise: 8-22 mins                      Larae Grooms, PTA 03/09/2018, 1:06 PM

## 2018-03-09 NOTE — Progress Notes (Signed)
[place] [facility] [place] NAME: Brianna Coleman    MR#:  [id number]  DATE OF BIRTH:  1940/10/13  SUBJECTIVE:   Son in the room. Patient currently on dopamine  and on BiPAP. She was restless and a bit agitated earlier according to the son REVIEW OF SYSTEMS:   Review of Systems  Unable to perform ROS: Medical condition   Tolerating Diet:soft Tolerating PT: evaluation noted  DRUG ALLERGIES:   Allergies  Allergen Reactions  . Levophed [Norepinephrine Bitartrate] Other (See Comments)    Pt with significant bradycardia.  Also EKG changes with inverted P wave and T wave morphology change    VITALS:  Blood pressure (!) 101/49, pulse 96, temperature 98.1 F (36.7 C), temperature source Axillary, resp. rate 18, height 5\' 3"  (1.6 m), weight 59.8 kg, SpO2 96 %.  PHYSICAL EXAMINATION:   Physical Exam  GENERAL:  77 y.o.-year-old patient lying in the bed with no acute distress. thin EYES: Pupils equal, round, reactive to light and accommodation. No scleral icterus. Extraocular muscles intact.  HEENT: Head atraumatic, normocephalic. Oropharynx and nasopharynx clear. BIPAP+ NECK:  Supple, no jugular venous distention. No thyroid enlargement, no tenderness.  LUNGS:decreased breath sounds bilaterally, no wheezing, rales, rhonchi. No use of accessory muscles of respiration.  Right sided perm cath+ CARDIOVASCULAR: S1, S2 normal. No murmurs, rubs, or gallops.  ABDOMEN: Soft, nontender, nondistended. Bowel sounds present. No organomegaly or mass.  EXTREMITIES: No cyanosis, clubbing or edema b/l.    NEUROLOGIC: unable to assess PSYCHIATRIC:  sedated SKIN: No obvious rash, lesion, or ulcer.   LABORATORY PANEL:  CBC Recent Labs  Lab 03/08/18 0429  WBC 4.6  HGB 7.0*  HCT 20.7*  PLT 135*    Chemistries  Recent Labs  Lab 03/06/18 2000  03/08/18 0429  NA 138   < > 142  K 3.3*   < > 3.8  CL 105   < > 109  CO2 28   < > 27  GLUCOSE 104*   < >  113*  BUN 38*   < > 40*  CREATININE 2.47*   < > 2.50*  CALCIUM 8.1*   < > 8.1*  MG 2.1  --   --   AST 20  --   --   ALT 8  --   --   ALKPHOS 56  --   --   BILITOT 0.6  --   --    < > = values in this interval not displayed.   Cardiac Enzymes Recent Labs  Lab 03/04/18 1750  TROPONINI 0.71*   RADIOLOGY:  [place] Ekg Site Rite  Result Date: 03/08/2018 If Site Rite image not attached, placement could not be confirmed due to current cardiac rhythm.  ASSESSMENT AND PLAN:  77 y.o. white female with hypertension, Parkinson's disorder, GI bleed, overactive bladder, iron deficiency anemia, hyperlipidemia, chronic musculoskeletal low back pain, chronic lower extremity edema, GERD, right basal ganglia CVA and vitamin D deficiency admitted with cardiac arrest.   *Acute hypoxic respiratory failure Intubated overnight--now extubated today.   On RA  * Cardiac arrest ([place])  - Echo Left ventricle:  cavity size was normal. Systolic function was   normal. The estimated ejection fraction was in the range of 55%   to 60%. - Aortic valve: Bicuspid; mildly thickened, mildly calcified   leaflets. There was moderate stenosis. Valve area (VTI): 0.94   cm^2. Valve area (Vmax): 1.45 cm^2. Valve area (Vmean): 1.73   cm^2. -  Mitral valve: There was mild regurgitation. 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 Hepatitis panel negative  *Hypotension with h/o Essential (primary) hypertension - monitor in ICU Blood pressure is much better today  *Parkinson's disease ([place]) - Home dose Sinemet  *HLD (hyperlipidemia) - Home dose antilipid   Palliative care c/s -  Plan of care discussed with son at bedside. She is not appropriate for any  kind of Rehab - no STR/SNF -son is in agreement. They would like to take her home with home health [facility]). Son stays home to take care of patient.  Outpatient dialysis chair time to be obtained. This was discussed with nephrology. Work in progress.  discussed with Care Management/Social Worker. Management plans discussed with the patient, family and they are in agreement.  CODE STATUS: full  DVT Prophylaxis: lovenox  TOTAL TIME TAKING CARE OF THIS PATIENT: *30* minutes.  >50% time spent on counselling and coordination of care  POSSIBLE D/C IN *few* DAYS, DEPENDING ON CLINICAL CONDITION.  Note: This dictation was prepared with Dragon dictation along with smaller phrase technology. Any transcriptional errors that result from this process are unintentional.  Fritzi Mandes M.D on 03/09/2018 at 3:02 PM  Between 7am to 6pm - Pager - [phone]  After 6pm go to [email or web address] - password [email or web address] [place] [facility]  Office  [phone]  CC: Primary care physician; [place] Pink, MDPatient ID: Brianna Coleman, female   DOB: 1940/09/11, 77 y.o.   MRN: [id number]

## 2018-03-09 NOTE — Progress Notes (Signed)
CRITICAL CARE NOTE  CC  follow up respiratory failure  SUBJECTIVE Patient remains critically ill Prognosis is guarded On biPAP increased WOB +signs of aspiration     SIGNIFICANT EVENTS 9/8 Cardiac arrest in ER, on BiPAP 9/9 on BiPAP, severe resp distress 9/11 on biPAP, resp distress, perm cath placed 9/12 cardiac arrest/CODE BLUE intubated, MV support 9/13 on [employer] support 9/15 extubated 9/17 Resp failure back on biPAP 9/18 resp failure back on biPAP   BP (!) 101/49   Pulse 96   Temp 98.1 F (36.7 C) (Axillary)   Resp 18   Ht 5\' 3"  (1.6 m)   Wt 59.8 kg   SpO2 96%   BMI 23.35 kg/m    REVIEW OF SYSTEMS  PATIENT IS UNABLE TO PROVIDE COMPLETE REVIEW OF SYSTEMS DUE TO SEVERE CRITICAL ILLNESS   PHYSICAL EXAMINATION:  GENERAL:critically ill appearing, +resp distress HEAD: Normocephalic, atraumatic.  EYES: Pupils equal, round, reactive to light.  No scleral icterus.  MOUTH: Moist mucosal membrane. NECK: Supple. No thyromegaly. No nodules. No JVD.  PULMONARY: +rhonchi, +wheezing CARDIOVASCULAR: S1 and S2. Regular rate and rhythm. No murmurs, rubs, or gallops.  GASTROINTESTINAL: Soft, nontender, -distended. No masses. Positive bowel sounds. No hepatosplenomegaly.  MUSCULOSKELETAL: No swelling, clubbing, or edema.  NEUROLOGIC: lethargic  SKIN:intact,warm,dry      Indwelling Urinary Catheter continued, requirement due to   Reason to continue Indwelling Urinary Catheter for strict Intake/Output monitoring for hemodynamic instability   Central Line continued, requirement due to   Reason to continue [employer] Monitoring of central venous pressure or other hemodynamic parameters      ASSESSMENT AND PLAN SYNOPSIS 77 yo with end stage parkinsons disease with severe resp failure from aspiration of gastric contents with progressive and ongoing resp distress and failure with hypoxia and hypercapnia   Severe Hypoxic and Hypercapnic Respiratory Failure On  biPAP High risk for intubation    Renal Failure-ESRD HD as needed and tolerated -follow chem 7 -follow UO -continue Foley Catheter-assess need daily Follow up Nephrology recs   CARDIAC ICU monitoring   GI/Nutritional Status Prophylaxis as indicated Diet-->NPO   ENDO - ICU hypoglycemic\Hyperglycemia protocol -check FSBS per protocol   ELECTROLYTES -follow labs as needed -replace as needed -pharmacy consultation and following   DVT/GI PRX ordered TRANSFUSIONS AS NEEDED MONITOR FSBS ASSESS the need for LABS as needed   Critical Care Time devoted to patient care services described in this note is 37 minutes.   Overall, patient is critically ill, prognosis is guarded.  Patient with Multiorgan failure and at high risk for cardiac arrest and death.   very poor prognosis  Palliative care team following  Corrin Parker, M.D.  [facility]  Medical Director [place] Director [facility] Cardio-Pulmonary Department

## 2018-03-10 ENCOUNTER — Inpatient Hospital Stay: Payer: Medicare Other

## 2018-03-10 DIAGNOSIS — N182 Chronic kidney disease, stage 2 (mild): Secondary | ICD-10-CM

## 2018-03-10 DIAGNOSIS — N170 Acute kidney failure with tubular necrosis: Secondary | ICD-10-CM

## 2018-03-10 LAB — BASIC METABOLIC PANEL
Anion gap: 7 (ref 5–15)
BUN: 30 mg/dL — ABNORMAL HIGH (ref 8–23)
CALCIUM: 8.3 mg/dL — AB (ref 8.9–10.3)
CO2: 30 mmol/L (ref 22–32)
CREATININE: 2.63 mg/dL — AB (ref 0.44–1.00)
Chloride: 105 mmol/L (ref 98–111)
GFR calc non Af Amer: 16 mL/min — ABNORMAL LOW (ref >60)
GFR, EST AFRICAN AMERICAN: 19 mL/min — AB (ref >60)
GLUCOSE: 92 mg/dL (ref 70–99)
Potassium: 3.9 mmol/L (ref 3.5–5.1)
Sodium: 142 mmol/L (ref 135–145)

## 2018-03-10 LAB — CBC WITH DIFFERENTIAL/PLATELET
BASOS PCT: 1 %
Basophils Absolute: 0.1 10*3/uL (ref 0–0.1)
EOS ABS: 0.1 10*3/uL (ref 0–0.7)
Eosinophils Relative: 2 %
HCT: 21.3 % — ABNORMAL LOW (ref 35.0–47.0)
Hemoglobin: 7.3 g/dL — ABNORMAL LOW (ref 12.0–16.0)
Lymphocytes Relative: 14 %
Lymphs Abs: 0.8 10*3/uL — ABNORMAL LOW (ref 1.0–3.6)
MCH: 32.3 pg (ref 26.0–34.0)
MCHC: 34.5 g/dL (ref 32.0–36.0)
MCV: 93.7 fL (ref 80.0–100.0)
MONO ABS: 0.4 10*3/uL (ref 0.2–0.9)
MONOS PCT: 7 %
NEUTROS PCT: 76 %
Neutro Abs: 4.5 10*3/uL (ref 1.4–6.5)
Platelets: 173 10*3/uL (ref 150–440)
RBC: 2.27 MIL/uL — ABNORMAL LOW (ref 3.80–5.20)
RDW: 16.1 % — ABNORMAL HIGH (ref 11.5–14.5)
WBC: 5.8 10*3/uL (ref 3.6–11.0)

## 2018-03-10 LAB — GLUCOSE, CAPILLARY
GLUCOSE-CAPILLARY: 81 mg/dL (ref 70–99)
GLUCOSE-CAPILLARY: 80 mg/dL (ref 70–99)
Glucose-Capillary: 96 mg/dL (ref 70–99)

## 2018-03-10 MED ORDER — MORPHINE SULFATE (PF) 2 MG/ML IV SOLN
1.0000 mg | INTRAVENOUS | Status: DC | PRN
  Administered 2018-03-10 – 2018-03-11 (×3): 2 mg via INTRAVENOUS
  Filled 2018-03-10 (×3): qty 1

## 2018-03-10 MED ORDER — LORAZEPAM 2 MG/ML IJ SOLN
0.5000 mg | INTRAMUSCULAR | Status: DC | PRN
  Administered 2018-03-10 – 2018-03-11 (×3): 0.5 mg via INTRAVENOUS
  Filled 2018-03-10 (×3): qty 1

## 2018-03-10 NOTE — Progress Notes (Addendum)
Daily Progress Note   Patient Name: Brianna Coleman       Date: 03/10/2018 DOB: February 15, 1941  Age: 77 y.o. MRN#: [id number] Attending Physician: Fritzi Mandes, MD Primary Care Physician: Maryland Pink, MD Admit Date: 03/11/2018  Reason for Consultation/Follow-up: Establishing goals of care, psychosocial support.   Subjective: Patient is resting in bed with eyes closed, dialysis in place. Her daughter and son are at bedside. Patient is confused. They state they would like to make her a DNR/DNI as they have spoken with CCM and understand her poor prognosis. They would like to continue Dopamine drip until family arrives. They do not want the Bipap reapplied. They wish to discontinue her other medications such as heparin and oral medications.   MOST form completed with daughter/POA for: DNR, comfort measures, no antibiotics, no IV fluid, no feeding tube.    Length of Stay: 11  Current Medications: Scheduled Meds:  . aspirin  81 mg Oral Daily  . atorvastatin  80 mg Oral QPM  . carbidopa-levodopa  1 tablet Oral QID  . Chlorhexidine Gluconate Cloth  6 each Topical Q0600  . clopidogrel  75 mg Oral Daily  . epoetin (EPOGEN/PROCRIT) injection  10,000 Units Intravenous Q T,Th,Sa-HD  . feeding supplement (NEPRO CARB STEADY)  237 mL Oral TID BM  . heparin injection (subcutaneous)  5,000 Units Subcutaneous Q8H  . mouth rinse  15 mL Mouth Rinse BID  . multivitamin  1 tablet Oral QHS  . pantoprazole (PROTONIX) IV  40 mg Intravenous Q12H  . polyethylene glycol  17 g Per Tube Once  . pramipexole  1 mg Oral TID  . senna-docusate  1 tablet Oral BID  . sodium chloride flush  10-40 mL Intracatheter Q12H    Continuous Infusions: . sodium chloride Stopped (03/08/18 0443)  . sodium chloride    . sodium  chloride    . dextrose 5 % and 0.45% NaCl 5 mL/hr at 03/10/18 1200  . DOPamine 4 mcg/kg/min (03/10/18 1200)    PRN Meds: sodium chloride, sodium chloride, sodium chloride, acetaminophen, alteplase, docusate, ipratropium-albuterol, lidocaine (PF), lidocaine-prilocaine, morphine injection, pentafluoroprop-tetrafluoroeth, sodium chloride flush  Physical Exam  Constitutional: No distress.  Pulmonary/Chest:  BIPAP  Neurological:  Resting with eyes closed.             Vital Signs: BP (!) 111/14 (  BP Location: Left Arm)   Pulse 97   Temp (!) 97.1 F (36.2 C) (Axillary)   Resp (!) 33   Ht 5\' 3"  (1.6 m)   Wt 56.8 kg   SpO2 94%   BMI 22.18 kg/m  SpO2: SpO2: 94 % O2 Device: O2 Device: Nasal Cannula O2 Flow Rate: O2 Flow Rate (L/min): 3 L/min  Intake/output summary:   Intake/Output Summary (Last 24 hours) at 03/10/2018 1427 Last data filed at 03/10/2018 1425 Gross per 24 hour  Intake 540.96 ml  Output 1009 ml  Net -468.04 ml   LBM: Last BM Date: 03/09/18 Baseline Weight: Weight: 55.3 kg Most recent weight: Weight: 56.8 kg       Palliative Assessment/Data:       Patient Active Problem List   Diagnosis Date Noted  . Acute respiratory failure ([place])   . Acidosis   . Protein-calorie malnutrition, severe 03/01/2018  . Cardiac arrest ([place]) 03/08/2018  . Acute renal failure superimposed on chronic kidney disease ([place]) 03/09/2018  . 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 Heart murmur 11/08/2017  . 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 Anemia 07/30/2017  . Orthostatic hypotension 06/17/2017  . TIA (transient ischemic attack) 05/24/2017  . Hallucinations 04/26/2017  . Apraxia 12/21/2016  . Cervicogenic headache 02/05/2016  . 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 Cerebrovascular accident (CVA) ([place]) 08/12/2015  . Transient alteration of awareness 07/31/2015  . 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 Absolute  anemia 01/26/2014  . Aortic valve defect 01/26/2014  . Congestive heart failure ([place]) 01/26/2014  . HLD (hyperlipidemia) 01/26/2014  . Pure hypercholesterolemia 01/26/2014  . Avitaminosis D 01/26/2014  . Carotid artery stenosis 09/19/2013  . Carotid artery obstruction 09/19/2013  . Occlusion and stenosis of carotid artery without mention of cerebral infarction 09/14/2013  . Paralysis agitans ([place]) 10/04/2012  . Abnormality of gait 10/04/2012  . Parkinson's disease ([place]) 10/04/2012  . Memory loss 06/21/2012  . Dysphagia, unspecified(787.20) 06/21/2012  . Unspecified essential hypertension 06/21/2012  . Other and unspecified hyperlipidemia 06/21/2012  . Unspecified cataract 06/21/2012  . Cataract 06/21/2012  . Can't get food down 06/21/2012  . Essential (primary) hypertension 06/21/2012    Palliative Care Assessment & Plan     Recommendations/Plan:  Continue Dopamine until family arrives and then shift to comfort care.   No further BIPAP. DNR/DNI.     Code Status:    Code Status Orders  (From admission, onward)         Start     Ordered   03/09/2018 2239  Full code  Continuous     02/25/2018 2238        Code Status History    Date Active Date Inactive Code Status Order ID Comments User Context   05/24/2017 1730 05/25/2017 2110 Full Code [id number]  Dustin Flock, MD ED   09/19/2013 1721 09/20/2013 1700 Full Code [id number]  Gabriel Earing, PA-C Inpatient    Advance Directive Documentation     Most Recent Value  Type of Advance Directive  Healthcare Power of Attorney  Pre-existing out of facility DNR order (yellow form or pink MOST form)  -  "MOST" Form in Place?  -       Prognosis:  Anticipated hospital death.  Cardiac arrest with resusitation on admission. Respiratory arrest 9/13. Initiation of dialysis this admissions. Parkinson's.   Discharge Planning:  Anticipated hospital death.   Care plan was discussed with RN  Thank  you for allowing the  Palliative Medicine Team to assist in the care of this patient.   Total Time 35 min Prolonged Time Billed  no      Greater than 50%  of this time was spent counseling and coordinating care related to the above assessment and plan.  Asencion Gowda, NP  Please contact Palliative Medicine Team phone at [phone] for questions and concerns.

## 2018-03-10 NOTE — Progress Notes (Signed)
CRITICAL CARE NOTE  CC  follow up respiratory failure  SUBJECTIVE Patient remains critically ill Prognosis is guarded On biPAP increased WOB Poor prognosis     SIGNIFICANT EVENTS 9/8 Cardiac arrest in ER, on BiPAP 9/9 on BiPAP, severe resp distress 9/11 on biPAP, resp distress, perm cath placed 9/12 cardiac arrest/CODE BLUE intubated, MV support 9/13 on Vnet support 9/15 extubated 9/17 Resp failure back on biPAP 9/18 resp failure back on biPAP 9/19 remains on biPAP severe resp failure   BP (!) 135/51   Pulse 69   Temp 98.2 F (36.8 C) (Axillary)   Resp 18   Ht 5\' 3"  (1.6 m)   Wt 59.2 kg   SpO2 100%   BMI 23.12 kg/m    REVIEW OF SYSTEMS  PATIENT IS UNABLE TO PROVIDE COMPLETE REVIEW OF SYSTEMS DUE TO SEVERE CRITICAL ILLNESS   PHYSICAL EXAMINATION:  GENERAL:critically ill appearing, +resp distress HEAD: Normocephalic, atraumatic.  EYES: Pupils equal, round, reactive to light.  No scleral icterus.  MOUTH: Moist mucosal membrane. NECK: Supple. No thyromegaly. No nodules. No JVD.  PULMONARY: +rhonchi, +wheezing CARDIOVASCULAR: S1 and S2. Regular rate and rhythm. No murmurs, rubs, or gallops.  GASTROINTESTINAL: Soft, nontender, -distended. No masses. Positive bowel sounds. No hepatosplenomegaly.  MUSCULOSKELETAL: No swelling, clubbing, or edema.  NEUROLOGIC: obtunded, GCS<8 SKIN:intact,warm,dry      Indwelling Urinary Catheter continued, requirement due to   Reason to continue Indwelling Urinary Catheter for strict Intake/Output monitoring for hemodynamic instability   Central Line continued, requirement due to   Reason to continue [employer] Monitoring of central venous pressure or other hemodynamic parameters    ASSESSMENT AND PLAN 77 yo with end stage parkinsons disease with severe resp failure from aspiration of gastric contents with progressive and ongoing resp distress and failure with hypoxia and hypercapnia    Severe Hypoxic and  Hypercapnic Respiratory Failure High risk for intubation Worsening resp status High risk for intubation    Renal Failure-most likely due to ATN -follow chem 7 -follow UO -continue Foley Catheter-assess need daily HD per nephrology  NEUROLOGY End stage Parkinsons disease Poor prognosis    CARDIAC ICU monitoring  ID -continue IV abx as prescibed -follow up cultures  GI/Nutrition GI PROPHYLAXIS as indicated DIET-->NPOConstipation protocol as indicated  ENDO - ICU hypoglycemic\Hyperglycemia protocol -check FSBS per protocol   ELECTROLYTES -follow labs as needed -replace as needed -pharmacy consultation and following   DVT/GI PRX ordered TRANSFUSIONS AS NEEDED MONITOR FSBS ASSESS the need for LABS as needed   Critical Care Time devoted to patient care services described in this note is 34 minutes.   Overall, patient is critically ill, prognosis is guarded.  Patient with Multiorgan failure and at high risk for cardiac arrest and death.   Will set up family meeting  Oakleigh Hesketh Patricia Pesa, M.D.  [facility]  Medical Director [place] Director [facility] Cardio-Pulmonary Department

## 2018-03-10 NOTE — Progress Notes (Signed)
Post HD assessment. Pt tolerated tx well. Pt on pressors in order to pull fluid during HD tx., MD aware. Net UF 1009, goal met.    03/10/18 1425  Vital Signs  Temp (!) 97.1 F (36.2 C)  Temp Source Axillary  Pulse Rate 97  Pulse Rate Source Monitor  Resp (!) 33  BP (!) 121/36  BP Location Left Arm  BP Method Automatic  Patient Position (if appropriate) Lying  Oxygen Therapy  SpO2 94 %  O2 Device Nasal Cannula  O2 Flow Rate (L/min) 3 L/min  Dialysis Weight  Weight 56.8 kg  Type of Weight Post-Dialysis  Post-Hemodialysis Assessment  Rinseback Volume (mL) 250 mL  KECN 51.8 V  Dialyzer Clearance Lightly streaked  Duration of HD Treatment -hour(s) 3 hour(s)  Hemodialysis Intake (mL) 500 mL  UF Total -Machine (mL) 1509 mL  Net UF (mL) 1009 mL  Tolerated HD Treatment Yes  Education / Care Plan  Dialysis Education Provided Yes  Documented Education in Care Plan Yes  Hemodialysis Catheter Right Subclavian  Placement Date/Time: 03/07/2018 1454   Placed prior to admission: Yes  Time Out: Correct patient;Correct site;Correct procedure  Maximum sterile barrier precautions: Hand hygiene;Cap;Mask;Sterile gown;Sterile gloves;Large sterile sheet  Site Prep: Chlor...  Site Condition No complications  Blue Lumen Status Heparin locked  Red Lumen Status Heparin locked  Purple Lumen Status N/A  Catheter fill solution Heparin 1000 units/ml  Catheter fill volume (Arterial) 1.5 cc  Catheter fill volume (Venous) 1.5  Dressing Type Biopatch  Dressing Status Clean;Dry;Intact  Drainage Description None  Post treatment catheter status Capped and Clamped

## 2018-03-10 NOTE — Progress Notes (Signed)
Patient DNR/DNI moving towards comfort measures with dopamine gtt infusing per family request, waiting on patient's sister to come from out of town to see her.  Per family, will let us know when it is time.   Receiving PRN ativan and morphine for anxiety and air hunger. Patient currently resting comfortably on 3L [place].   Report given to night RN with no further questions.

## 2018-03-10 NOTE — Progress Notes (Signed)
[place] at [place] NAME: Brianna Coleman    MR#:  [id number]  DATE OF BIRTH:  [birth year]  SUBJECTIVE:   Pt overall declining. Family in the room. Per RN leaning towards comfort care REVIEW OF SYSTEMS:   Review of Systems  Unable to perform ROS: Medical condition   Tolerating Diet:soft Tolerating PT: evaluation noted  DRUG ALLERGIES:   Allergies  Allergen Reactions  . Levophed [Norepinephrine Bitartrate] Other (See Comments)    Pt with significant bradycardia.  Also EKG changes with inverted P wave and T wave morphology change    VITALS:  Blood pressure (!) 111/99, pulse (!) 103, temperature (!) 97.1 F (36.2 C), temperature source Axillary, resp. rate (!) 37, height 5\' 3"  (1.6 m), weight 56.8 kg, SpO2 (!) 84 %.  PHYSICAL EXAMINATION:   Physical Exam limited  GENERAL:  77 y.o.-year-old patient lying in the bed with no acute distress. thin HEENT: Head atraumatic, normocephalic. Oropharynx and nasopharynx clear. BIPAP+ NECK:  Supple, no jugular venous distention. No thyroid enlargement, no tenderness.  LUNGS:decreased breath sounds bilaterally, no wheezing, rales, rhonchi. No use of accessory muscles of respiration.  Right sided perm cath+ CARDIOVASCULAR: S1, S2 normal. No murmurs, rubs, or gallops.  ABDOMEN: Soft, nontender, nondistended. Bowel sounds present. No organomegaly or mass.  EXTREMITIES: No cyanosis, clubbing or edema b/l.    NEUROLOGIC: unable to assess PSYCHIATRIC:  Unable to assess due to poor condition   LABORATORY PANEL:  CBC Recent Labs  Lab 03/10/18 0413  WBC 5.8  HGB 7.3*  HCT 21.3*  PLT 173    Chemistries  Recent Labs  Lab 03/06/18 2000  03/10/18 0413  NA 138   < > 142  K 3.3*   < > 3.9  CL 105   < > 105  CO2 28   < > 30  GLUCOSE 104*   < > 92  BUN 38*   < > 30*  CREATININE 2.47*   < > 2.63*  CALCIUM 8.1*   < > 8.3*  MG 2.1  --   --   AST 20  --   --   ALT 8  --   --   ALKPHOS 56   --   --   BILITOT 0.6  --   --    < > = values in this interval not displayed.   Cardiac Enzymes Recent Labs  Lab 03/04/18 1750  TROPONINI 0.71*   RADIOLOGY:  Dg Chest Port 1 View  Result Date: 03/10/2018 CLINICAL DATA:  Acute respiratory failure. EXAM: PORTABLE CHEST 1 VIEW COMPARISON:  Radiograph of March 06, 2018. FINDINGS: Stable cardiomegaly. Atherosclerosis of thoracic aorta is noted. Right internal jugular dialysis catheter is unchanged in position. No pneumothorax is noted. Stable large hiatal hernia. Stable bibasilar opacities are noted concerning for atelectasis or infiltrates with associated pleural effusion. Bony thorax is unremarkable. IMPRESSION: Stable large hiatal hernia. Stable bibasilar opacities as described above. Electronically Signed   By: Marijo Conception, M.D.   On: 03/10/2018 09:51   ASSESSMENT AND PLAN:  77 y.o. white female with hypertension, Parkinson's disorder, GI bleed, overactive bladder, iron deficiency anemia, hyperlipidemia, chronic musculoskeletal low back pain, chronic lower extremity edema, GERD, right basal ganglia CVA and vitamin D deficiency admitted with cardiac arrest.   *Acute hypoxic respiratory failure * Cardiac arrest ([place])  - Echo Left ventricle:  cavity size was normal. Systolic function was   normal. The estimated ejection fraction was in  the range of 55%   to 60%. - Aortic valve: Bicuspid; mildly thickened, mildly calcified   leaflets. There was moderate stenosis. Valve area (VTI): 0.94   cm^2. Valve area (Vmax): 1.45 cm^2. Valve area (Vmean): 1.73   cm^2. - Mitral valve: There was mild regurgitation. 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 Family leaning towards comfort care  discussed with Care Management/Social Worker. Management plans discussed with the patient, family and they are in agreement.  CODE STATUS:DNR/DNI  TOTAL TIME TAKING CARE OF THIS PATIENT: 15* minutes.  >50% time spent on counselling and coordination of care   Note: This dictation was prepared with Dragon dictation along with smaller phrase technology. Any transcriptional errors that result from this process are unintentional.  Fritzi Mandes M.D on 03/10/2018 at 3:27 PM  Between 7am to 6pm - Pager - [phone]  After 6pm go to [email or web address] - password [email or web address] [place] [facility]  Office  [phone]  CC: Primary care physician; Maryland Pink, MDPatient ID: Brianna Coleman, female   DOB: 01-07-1941, 77 y.o.   MRN: [id number]

## 2018-03-10 NOTE — Care Management (Signed)
Patient's nutritional status is not optimal.  After eats requires bipap.  Discussion regarding tube feeds/TPN/ eat for pleasure.  Per  care team, patient is not able to sit.  She can not be lifted to a chair to sit.  At present, her dialysis is being performed in the room. She is requiring Dopamine during the treatment  Attending will discuss ltac concept with family today and update CM so can offer choice.  The fact that she will need intermittent hemodialysis must also be considered. CSW documented yesterday that family is declining skilled nursing facility placement.  If patient discharges home the need for outpatient dialysis would be a roadblock if patient can not sit.  CM will speak with family about dispositions after attending speaks with them.

## 2018-03-10 NOTE — Progress Notes (Signed)
SLP Cancellation Note  Patient Details Name: Brianna Coleman MRN: [id number] DOB: 15-Feb-1941   Cancelled treatment:       Reason Eval/Treat Not Completed: Patient not medically ready;Patient at procedure or test/unavailable(chart reviewed; pt receiving HD w/ BiPAP placed during). Pt's BP lowered and pt required O2 support. Noted MD meeting w/ family re: goals of care earlier.  ST services will f/u w/ pt's status tomorrow. NP was informed of SLP's concern for dysphagia when drowsy/SOB as well as overall ability to safely and fully meet her nutrition/hydration needs in her current presentation. NP will updated MD.     Orinda Kenner, MS, CCC-SLP [facility] Mongiello 03/10/2018, 1:17 PM

## 2018-03-10 NOTE — Progress Notes (Signed)
HD tx end    03/10/18 1416  Vital Signs  Pulse Rate 97  Pulse Rate Source Monitor  Resp (!) 23  BP (!) 111/14  BP Location Left Arm  BP Method Automatic  Patient Position (if appropriate) Lying  Oxygen Therapy  SpO2 100 %  O2 Device Bi-PAP  During Hemodialysis Assessment  Dialysis Fluid Bolus Normal Saline  Bolus Amount (mL) 250 mL  Intra-Hemodialysis Comments Tx completed

## 2018-03-10 NOTE — Progress Notes (Signed)
RN notified Dr Abigail Butts regarding patient taking meds,as patient will be getting dialysis shortly and may be dialyze out.  RN went over med list with MD, MD states "it is fine to give all those meds"

## 2018-03-10 NOTE — Progress Notes (Signed)
Post HD assessment    03/10/18 1424  Neurological  Level of Consciousness Alert  Orientation Level Oriented to person  Respiratory  Respiratory Pattern Tachypnea;Dyspnea at rest;Dyspnea with exertion  Chest Assessment Chest expansion symmetrical  Cardiac  Cardiac Rhythm NSR;ST  Vascular  R Radial Pulse +2  L Radial Pulse +2  Edema Generalized;Right lower extremity;Left lower extremity  Integumentary  Integumentary (WDL) X  Skin Color Appropriate for ethnicity  Musculoskeletal  Musculoskeletal (WDL) X  Generalized Weakness Yes  Assistive Device None  GU Assessment  Genitourinary (WDL) X  Genitourinary Symptoms  (HD)  Psychosocial  Psychosocial (WDL) X  Patient Behaviors Cooperative;Calm  Emotional support given Given to patient;Given to patient's family

## 2018-03-10 NOTE — Progress Notes (Signed)
[facility]  ROUNDING NOTE   Subjective:   Daughter at bedside.   Patient alert to self and place this morning.   Anuric  Dopamine gtt   Objective:  Vital signs in last 24 hours:  Temp:  [97 F (36.1 C)-98.4 F (36.9 C)] 98.4 F (36.9 C) (09/19 0800) Pulse Rate:  [65-96] 81 (09/19 0800) Resp:  [12-28] 19 (09/19 0800) BP: (87-155)/(42-118) 115/75 (09/19 0800) SpO2:  [84 %-100 %] 100 % (09/19 0800) Weight:  [59.2 kg-59.8 kg] 59.2 kg (09/19 0500)  Weight change: -0.9 kg Filed Weights   03/08/18 1206 03/09/18 1400 03/10/18 0500  Weight: 59.7 kg 59.8 kg 59.2 kg    Intake/Output: I/O last 3 completed shifts: In: 510.6 [P.O.:240; I.V.:270.6] Out: -    Intake/Output this shift:  No intake/output data recorded.  Physical Exam: General: No acute distress  Head: Normocephalic, atraumatic.  Oral mucosa moist  Eyes: Anicteric  Neck: Supple, trachea midline  Lungs:  Scattered rhonchi, crackles  Heart: S1S2 no rubs  Abdomen:  Soft, nontender, bowel sounds present  Extremities: No peripheral edema.  Neurologic: Alert to self and place  Skin: No lesions  Access:  R IJ permcath    Basic Metabolic Panel: Recent Labs  Lab 03/03/18 2336  03/06/18 0018 03/06/18 2000 03/07/18 0529 03/08/18 0120 03/08/18 0429 03/10/18 0413  NA 145   < > 142 138 141  --  142 142  K 3.6   < > 3.0* 3.3* 3.5  --  3.8 3.9  CL 112*   < > 109 105 108  --  109 105  CO2 26   < > 27 28 28   --  27 30  GLUCOSE 130*   < > 120* 104* 104*  --  113* 92  BUN 97*   < > 32* 38* 38*  --  40* 30*  CREATININE 4.31*   < > 1.97* 2.47* 2.43*  --  2.50* 2.63*  CALCIUM 8.3*   < > 7.5* 8.1* 7.8*  --  8.1* 8.3*  MG 2.7*  --   --  2.1  --   --   --   --   PHOS 3.5  --  2.5 4.0  --  3.3  --   --    < > = values in this interval not displayed.    Liver Function Tests: Recent Labs  Lab 03/03/18 2336 03/06/18 0018 03/06/18 2000  AST 52*  --  20  ALT 7  --  8  ALKPHOS 74  --  56  BILITOT 1.0   --  0.6  PROT 5.4*  --  4.7*  ALBUMIN 2.8* 2.3* 2.3*   No results for input(s): LIPASE, AMYLASE in the last 168 hours. No results for input(s): AMMONIA in the last 168 hours.  CBC: Recent Labs  Lab 03/03/18 2336  03/05/18 0853 03/06/18 1134 03/06/18 2000 03/08/18 0429 03/10/18 0413  WBC 9.1   < > 5.2 4.8 4.1 4.6 5.8  NEUTROABS 8.1*  --  4.0 3.9  --  3.4 4.5  HGB 8.0*   < > 7.6* 7.4* 7.4* 7.0* 7.3*  HCT 24.3*   < > 22.4* 21.9* 21.7* 20.7* 21.3*  MCV 95.5   < > 93.4 94.6 93.7 94.8 93.7  PLT 111*   < > 110* 113* 110* 135* 173   < > = values in this interval not displayed.    Cardiac Enzymes: Recent Labs  Lab 03/03/18 2336 03/04/18 1750  TROPONINI 0.82*  0.71*    BNP: Invalid input(s): POCBNP  CBG: Recent Labs  Lab 03/09/18 1553 03/09/18 2003 03/09/18 2336 03/10/18 0403 03/10/18 0738  GLUCAP 81 82 97 81 80    Microbiology: Results for orders placed or performed during the hospital encounter of 03/21/2018  Blood culture (routine x 2)     Status: None   Collection Time: 03/09/2018  7:54 PM  Result Value Ref Range Status   Specimen Description BLOOD RIGHT WRIST  Final   Special Requests   Final    BOTTLES DRAWN AEROBIC AND ANAEROBIC Blood Culture results may not be optimal due to an excessive volume of blood received in culture bottles   Culture   Final    NO GROWTH 5 DAYS Performed at [facility], [address] [place], [place] [address]    Report Status 03/04/2018 FINAL  Final  MRSA PCR Screening     Status: None   Collection Time: 02/22/2018 10:39 PM  Result Value Ref Range Status   MRSA by PCR NEGATIVE NEGATIVE Final    Comment:        The GeneXpert MRSA Assay (FDA approved for NASAL specimens only), is one component of a comprehensive MRSA colonization surveillance program. It is not intended to diagnose MRSA infection nor to guide or monitor treatment for MRSA infections. Performed at [facility], [address]  [place], [place] [address]   Blood culture (routine x 2)     Status: None   Collection Time: 03/20/2018 11:12 PM  Result Value Ref Range Status   Specimen Description BLOOD LEFT ARM  Final   Special Requests   Final    BOTTLES DRAWN AEROBIC AND ANAEROBIC Blood Culture results may not be optimal due to an inadequate volume of blood received in culture bottles   Culture   Final    NO GROWTH 5 DAYS Performed at [facility], [address], [place], [place] [address]    Report Status 03/04/2018 FINAL  Final    Coagulation Studies: No results for input(s): LABPROT, INR in the last 72 hours.  Urinalysis: No results for input(s): COLORURINE, LABSPEC, PHURINE, GLUCOSEU, HGBUR, BILIRUBINUR, KETONESUR, PROTEINUR, UROBILINOGEN, NITRITE, LEUKOCYTESUR in the last 72 hours.  Invalid input(s): APPERANCEUR    Imaging: [place] Ekg Site Rite  Result Date: 03/08/2018 If Site Rite image not attached, placement could not be confirmed due to current cardiac rhythm.    Medications:   . sodium chloride Stopped (03/08/18 0443)  . sodium chloride    . sodium chloride    . dextrose 5 % and 0.45% NaCl 5 mL/hr at 03/10/18 0600  . DOPamine 2 mcg/kg/min (03/10/18 0600)   . aspirin  81 mg Oral Daily  . atorvastatin  80 mg Oral QPM  . carbidopa-levodopa  1 tablet Oral QID  . Chlorhexidine Gluconate Cloth  6 each Topical Q0600  . clopidogrel  75 mg Oral Daily  . epoetin (EPOGEN/PROCRIT) injection  10,000 Units Intravenous Q T,Th,Sa-HD  . feeding supplement (NEPRO CARB STEADY)  237 mL Oral TID BM  . heparin injection (subcutaneous)  5,000 Units Subcutaneous Q8H  . mouth rinse  15 mL Mouth Rinse BID  . multivitamin  1 tablet Oral QHS  . pantoprazole (PROTONIX) IV  40 mg Intravenous Q12H  . polyethylene glycol  17 g Per Tube Once  . pramipexole  1 mg Oral TID  . senna-docusate  1 tablet Oral BID  . sodium chloride flush  10-40 mL Intracatheter Q12H  sodium chloride, sodium chloride, sodium chloride,  acetaminophen, alteplase, docusate, ipratropium-albuterol, lidocaine (PF), lidocaine-prilocaine, morphine injection, pentafluoroprop-tetrafluoroeth, sodium chloride flush  Assessment/ Plan:  77 y.o. white female with hypertension, Parkinson's disorder, GI bleed, overactive bladder, iron deficiency anemia, hyperlipidemia, chronic musculoskeletal low back pain, chronic lower extremity edema, GERD, right basal ganglia CVA and vitamin D deficiency admitted with cardiac arrest.   1.  End-stage renal disease.  TTS schedule.  - Hemodialysis for today. Orders prepared.  -   Outpatient planning for [employer].  2.  Anemia of chronic kidney disease.  Hemoglobin 7.3  - EPO will hemodialysis treatment.   3.  Secondary hyperparathyroidism. PTH 88 - low for level of kidney function. Corrected calcium and phosphorus at goal. Not currently on vitamin D agents.   4. Hypotension: currently placed on dopamine gtt. Holding home blood pressure medications.    LOS: 11 [place] Gilpatrick 9/19/20198:47 AM

## 2018-03-10 NOTE — Progress Notes (Signed)
HD tx start    03/10/18 1112  Vital Signs  Pulse Rate 87  Pulse Rate Source Monitor  Resp 19  BP (!) 110/48  BP Location Left Arm  BP Method Automatic  Patient Position (if appropriate) Lying  Oxygen Therapy  SpO2 90 %  O2 Device Nasal Cannula  O2 Flow Rate (L/min) 3 L/min  During Hemodialysis Assessment  Blood Flow Rate (mL/min) 300 mL/min  Arterial Pressure (mmHg) -90 mmHg  Venous Pressure (mmHg) 60 mmHg  Transmembrane Pressure (mmHg) 60 mmHg  Ultrafiltration Rate (mL/min) 500 mL/min  Dialysate Flow Rate (mL/min) 600 ml/min  Conductivity: Machine  13.9  HD Safety Checks Performed Yes  Dialysis Fluid Bolus Normal Saline  Bolus Amount (mL) 250 mL  Intra-Hemodialysis Comments Tx initiated  Hemodialysis Catheter Right Subclavian  Placement Date/Time: 03/13/2018 1454   Placed prior to admission: Yes  Time Out: Correct patient;Correct site;Correct procedure  Maximum sterile barrier precautions: Hand hygiene;Cap;Mask;Sterile gown;Sterile gloves;Large sterile sheet  Site Prep: Chlor...  Blue Lumen Status Infusing  Red Lumen Status Infusing

## 2018-03-10 NOTE — Care Management (Signed)
No discussion with family regarding ltac because family now leaning towards comfort care

## 2018-03-10 NOTE — Progress Notes (Signed)
   03/10/18 1345  Clinical Encounter Type  Visited With Patient and family together;Health care provider  Visit Type Follow-up  Referral From Nurse  Consult/Referral To Chaplain  Spiritual Encounters  Spiritual Needs Emotional   While rounding on the unit, nurse informed chaplain that patient's son was talking with palliative care.  Chaplain checked in with patient's daughter at bedside while patient was receiving dialysis.  Chaplain offered a calm, non-anxious presence and moved to patient's bedside when daughter left to speak with her brother.  Patient's son then returned and chaplain utilized active and reflective listening as son spoke of patient's life and their relationship.  After dialysis and transition from mask to nasal cannula, chaplain excused herself to allow family time together.  Chaplain encouraged family to reach out as needed.  Unit chaplain will communicate with on-call chaplain about possible follow up.

## 2018-03-10 NOTE — Progress Notes (Signed)
Pre HD assessment    03/10/18 1051  Neurological  Level of Consciousness Alert  Orientation Level Oriented to person  Respiratory  Respiratory Pattern Regular;Unlabored  Chest Assessment Chest expansion symmetrical  Cardiac  Pulse Regular  ECG Monitor Yes  Cardiac Rhythm NSR  Vascular  R Radial Pulse +2  L Radial Pulse +2  Edema Generalized;Right lower extremity;Left lower extremity  Integumentary  Integumentary (WDL) X  Skin Color Appropriate for ethnicity  Musculoskeletal  Musculoskeletal (WDL) X  Generalized Weakness Yes  Assistive Device None  GU Assessment  Genitourinary (WDL) X  Genitourinary Symptoms  (HD)  Psychosocial  Psychosocial (WDL) X  Patient Behaviors Cooperative;Calm  Emotional support given Given to patient;Given to patient's family

## 2018-03-10 NOTE — Progress Notes (Signed)
Pre HD assessment   03/10/18 1050  Vital Signs  Temp (!) 97.5 F (36.4 C)  Temp Source Oral  Pulse Rate 85  Pulse Rate Source Monitor  Resp 18  BP (!) 90/48  BP Location Left Arm  BP Method Automatic  Patient Position (if appropriate) Lying  Oxygen Therapy  SpO2 100 %  O2 Device Nasal Cannula  O2 Flow Rate (L/min) 3 L/min  Pain Assessment  Pain Scale 0-10  Pain Score 0  PAINAD (Pain Assessment in Advanced Dementia)  Breathing 0  Negative Vocalization 0  Facial Expression 0  Body Language 0  Consolability 1  PAINAD Score 1  Dialysis Weight  Weight 58.9 kg  Type of Weight Pre-Dialysis  Time-Out for Hemodialysis  What Procedure? HD  Pt Identifiers(min of two) First/Last Name;MRN/Account#  Correct Site? Yes  Correct Side? Yes  Correct Procedure? Yes  Consents Verified? Yes  Rad Studies Available? N/A  Safety Precautions Reviewed? Yes  Engineer, civil (consulting) Number  (4A)  Station Number  (Bedside ICU 07)  UF/Alarm Test Passed  Conductivity: Meter 13.8  Conductivity: Machine  13.9  pH 7.6  Reverse Osmosis  (WRO #1)  Normal Saline Lot Number [id number]  Dialyzer Lot Number [id number]  Disposable Set Lot Number [id number]  Machine Temperature 98.6 F (37 C)  Musician and Audible Yes  Blood Lines Intact and Secured Yes  Pre Treatment Patient Checks  Vascular access used during treatment Catheter  Hepatitis B Surface Antigen Results Negative  Date Hepatitis B Surface Antigen Drawn 03/03/18  Hepatitis B Surface Antibody  (<10)  Date Hepatitis B Surface Antibody Drawn 03/03/18  Hemodialysis Consent Verified Yes  Hemodialysis Standing Orders Initiated Yes  ECG (Telemetry) Monitor On Yes  Prime Ordered Normal Saline  Length of  DialysisTreatment -hour(s) 3 Hour(s)  Dialyzer Elisio 17H NR  Dialysate 3K, 2.5 Ca  Dialysis Anticoagulant None  Dialysate Flow Ordered 600  Blood Flow Rate Ordered 300 mL/min  Ultrafiltration Goal 1 Liters  Pre Treatment Labs   (creatinine)  Dialysis Blood Pressure Support Ordered Normal Saline  Education / Care Plan  Dialysis Education Provided Yes  Documented Education in Care Plan Yes  Hemodialysis Catheter Right Subclavian  Placement Date/Time: 03/13/2018 1454   Placed prior to admission: Yes  Time Out: Correct patient;Correct site;Correct procedure  Maximum sterile barrier precautions: Hand hygiene;Cap;Mask;Sterile gown;Sterile gloves;Large sterile sheet  Site Prep: Chlor...  Site Condition No complications  Blue Lumen Status Heparin locked  Red Lumen Status Heparin locked  Purple Lumen Status N/A  Dressing Type Biopatch  Dressing Status Clean;Dry;Intact  Drainage Description None

## 2018-03-11 MED ORDER — BIOTENE DRY MOUTH MT LIQD: 15.0000 mL | OROMUCOSAL | Status: DC | PRN

## 2018-03-11 MED ORDER — MORPHINE SULFATE (PF) 2 MG/ML IV SOLN
1.0000 mg | INTRAVENOUS | Status: DC | PRN
  Administered 2018-03-11: 2 mg via INTRAVENOUS
  Filled 2018-03-11: qty 1

## 2018-03-11 MED ORDER — GLYCOPYRROLATE 0.2 MG/ML IJ SOLN
0.2000 mg | INTRAMUSCULAR | Status: DC | PRN
  Administered 2018-03-11 – 2018-03-12 (×5): 0.2 mg via INTRAVENOUS
  Filled 2018-03-11 (×5): qty 1

## 2018-03-11 MED ORDER — GLYCOPYRROLATE 0.2 MG/ML IJ SOLN: 0.2000 mg | INTRAMUSCULAR | Status: DC | PRN

## 2018-03-11 MED ORDER — HALOPERIDOL LACTATE 2 MG/ML PO CONC: 0.5000 mg | ORAL | Status: DC | PRN

## 2018-03-11 MED ORDER — MORPHINE SULFATE (PF) 2 MG/ML IV SOLN: 1.0000 mg | INTRAVENOUS | Status: DC | PRN

## 2018-03-11 MED ORDER — ONDANSETRON HCL 4 MG/2ML IJ SOLN: 4.0000 mg | Freq: Four times a day (QID) | INTRAMUSCULAR | Status: DC | PRN

## 2018-03-11 MED ORDER — GLYCOPYRROLATE 1 MG PO TABS: 1.0000 mg | ORAL_TABLET | ORAL | Status: DC | PRN

## 2018-03-11 MED ORDER — HALOPERIDOL LACTATE 5 MG/ML IJ SOLN: 0.5000 mg | INTRAMUSCULAR | Status: DC | PRN

## 2018-03-11 MED ORDER — ACETAMINOPHEN 325 MG PO TABS: 650.0000 mg | ORAL_TABLET | Freq: Four times a day (QID) | ORAL | Status: DC | PRN

## 2018-03-11 MED ORDER — HALOPERIDOL 0.5 MG PO TABS: 0.5000 mg | ORAL_TABLET | ORAL | Status: DC | PRN

## 2018-03-11 MED ORDER — POLYVINYL ALCOHOL 1.4 % OP SOLN
1.0000 [drp] | Freq: Four times a day (QID) | OPHTHALMIC | Status: DC | PRN
  Filled 2018-03-11: qty 15

## 2018-03-11 MED ORDER — ONDANSETRON 4 MG PO TBDP
4.0000 mg | ORAL_TABLET | Freq: Four times a day (QID) | ORAL | Status: DC | PRN
  Filled 2018-03-11: qty 1

## 2018-03-11 MED ORDER — MORPHINE SULFATE (PF) 4 MG/ML IV SOLN
INTRAVENOUS | Status: AC
  Administered 2018-03-11: 4 mg via INTRAVENOUS
  Filled 2018-03-11: qty 1

## 2018-03-11 MED ORDER — LORAZEPAM 2 MG/ML IJ SOLN: 0.5000 mg | INTRAMUSCULAR | Status: DC | PRN

## 2018-03-11 MED ORDER — MORPHINE SULFATE (PF) 4 MG/ML IV SOLN
3.0000 mg | INTRAVENOUS | Status: DC | PRN
  Administered 2018-03-11 (×2): 4 mg via INTRAVENOUS
  Filled 2018-03-11: qty 1

## 2018-03-11 MED ORDER — MORPHINE 100MG IN NS 100ML (1MG/ML) PREMIX INFUSION
5.0000 mg/h | INTRAVENOUS | Status: DC
  Administered 2018-03-11: 2 mg/h via INTRAVENOUS
  Filled 2018-03-11: qty 100

## 2018-03-11 MED ORDER — MORPHINE BOLUS VIA INFUSION
2.0000 mg | INTRAVENOUS | Status: DC | PRN
  Administered 2018-03-11 – 2018-03-12 (×3): 2 mg via INTRAVENOUS
  Filled 2018-03-11: qty 2

## 2018-03-11 MED ORDER — ACETAMINOPHEN 650 MG RE SUPP: 650.0000 mg | Freq: Four times a day (QID) | RECTAL | Status: DC | PRN

## 2018-03-11 NOTE — Progress Notes (Signed)
PT Cancellation Note  Patient Details Name: Brianna Coleman MRN: [id number] DOB: [birth year]   Cancelled Treatment:    Reason Eval/Treat Not Completed: Medical issues which prohibited therapy(Per chart review, patient/family planning for transition to comfort care.  Will hold therapy services and complete order at this time.  Please reconsult should goals of care change.)   Lindie Roberson H. Owens Shark, PT, DPT, NCS 03/11/18, 8:34 AM [phone]

## 2018-03-11 NOTE — Progress Notes (Signed)
She has been transferred to comfort care.Dopamine was stopped earlier. Family by the bed side and has no questions and are thankful to the staff for the care they have been providing.

## 2018-03-11 NOTE — Progress Notes (Signed)
Patient ID: Brianna Coleman, female   DOB: 1940/08/19, 77 y.o.   MRN: [id number] patient has been transition to care only. Family at the bedside. She is currently on morphine drip. Patient is a DNR.

## 2018-03-11 NOTE — Progress Notes (Signed)
   03/11/18 1450  Clinical Encounter Type  Visited With Patient and family together  Visit Type Follow-up  Spiritual Encounters  Spiritual Needs Emotional   Chaplain met with patient's son, daughter, and sister at patient's bedside.  Chaplain utilized active and reflective listening as family shared stories of life with patient and how she mothered them.  Conversation surrounding family photos and the stories behind them.  Chaplain asked if family would like on-call chaplain to check in later this evening; family indicated that they would call if they needed anything.

## 2018-03-11 NOTE — Progress Notes (Signed)
This RN notified that family is ready to have dopamine turned off and have patient made full comfort care. This RN went into room and discontinued dopamine. Monitor turned to privacy screen and family educated that this is so that they may spend time with patient without focusing on numbers on the screen. Family verbalized understanding. Fresh condolence cart ordered from dining services. Emotional support provided to family and patient. Will continue to monitor.

## 2018-03-11 NOTE — Progress Notes (Signed)
Daily Progress Note   Patient Name: Brianna Coleman       Date: 03/11/2018 DOB: February 20, 1941  Age: 77 y.o. MRN#: [id number] Attending Physician: Fritzi Mandes, MD Primary Care Physician: [place] Pink, MD Admit Date: 03/06/2018  Reason for Consultation/Follow-up: Establishing goals of care, psychosocial support.   Subjective: Patient is resting in bed with eyes closed. She grimaces and coughs appearing uncomfortable. Son and the  family member they have been waiting for is at bedside. Son states they are not sure when they want to stop the Dopamine but he will speak to his sister [name]. Discussed symptom management.  Christine to bedside. Met with her and her brother [name]. They state they do not want their mother to suffer and are ready to proceed with discontinuing the Dopamine and allowing her to die peacefully. RN in to D/C Dopamine.   MOST form completed and in chart for DNR, comfort measures, no feeding tube, no IV fluids, no abx.      Addendum: PRN Morphine changed to Morphine drip. Patient is restless and uncomfortable. She is dyspneic. Ativan range dose placed, and frequency increased.   Addendum: Rechecked patient. No signs of distress noted. Family at bedside.  Length of Stay: 12  Current Medications: Scheduled Meds:  . mouth rinse  15 mL Mouth Rinse BID  . pantoprazole (PROTONIX) IV  40 mg Intravenous Q12H  . sodium chloride flush  10-40 mL Intracatheter Q12H    Continuous Infusions: . sodium chloride Stopped (03/08/18 0443)  . sodium chloride    . sodium chloride    . dextrose 5 % and 0.45% NaCl Stopped (03/10/18 1657)  . DOPamine 5 mcg/kg/min (03/11/18 0300)    PRN Meds: sodium chloride, sodium chloride, sodium chloride, alteplase, glycopyrrolate,  ipratropium-albuterol, lidocaine (PF), lidocaine-prilocaine, LORazepam, morphine injection, pentafluoroprop-tetrafluoroeth, sodium chloride flush  Physical Exam  Pulmonary/Chest:  [place] in place.   Neurological:  Resting with eyes closed.             Vital Signs: BP (!) 117/49 (BP Location: Left Arm)   Pulse 92   Temp (!) 97.2 F (36.2 C) (Axillary)   Resp 16   Ht 5' 3"  (1.6 m)   Wt 56.8 kg   SpO2 100%   BMI 22.18 kg/m  SpO2: SpO2: 100 % O2 Device: O2 Device: Nasal Cannula  O2 Flow Rate: O2 Flow Rate (L/min): 3 L/min  Intake/output summary:   Intake/Output Summary (Last 24 hours) at 03/11/2018 1034 Last data filed at 03/11/2018 0943 Gross per 24 hour  Intake 156.58 ml  Output 1009 ml  Net -852.42 ml   LBM: Last BM Date: 03/09/18 Baseline Weight: Weight: 55.3 kg Most recent weight: Weight: 56.8 kg       Palliative Assessment/Data:       Patient Active Problem List   Diagnosis Date Noted  . Acute respiratory failure ([place])   . Acidosis   . Protein-calorie malnutrition, severe 03/01/2018  . Cardiac arrest ([place]) 03/01/2018  . Acute renal failure superimposed on chronic kidney disease ([place]) 02/24/2018  . Anemia in stage 4 chronic kidney disease ([place]) 11/08/2017  . Heart murmur 11/08/2017  . 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 Anemia 07/30/2017  . Orthostatic hypotension 06/17/2017  . TIA (transient ischemic attack) 05/24/2017  . Hallucinations 04/26/2017  . Apraxia 12/21/2016  . Cervicogenic headache 02/05/2016  . Stroke due to embolism of right carotid artery ([place]) 09/06/2015  . Cerebrovascular accident (CVA) ([place]) 08/12/2015  . Transient alteration of awareness 07/31/2015  . 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 Absolute anemia 01/26/2014  . Aortic valve defect 01/26/2014  . Congestive heart failure ([place]) 01/26/2014  . HLD (hyperlipidemia) 01/26/2014  . Pure hypercholesterolemia 01/26/2014  . Avitaminosis D 01/26/2014  . Carotid artery stenosis  09/19/2013  . Carotid artery obstruction 09/19/2013  . Occlusion and stenosis of carotid artery without mention of cerebral infarction 09/14/2013  . Paralysis agitans ([place]) 10/04/2012  . Abnormality of gait 10/04/2012  . Parkinson's disease ([place]) 10/04/2012  . Memory loss 06/21/2012  . Dysphagia, unspecified(787.20) 06/21/2012  . Unspecified essential hypertension 06/21/2012  . Other and unspecified hyperlipidemia 06/21/2012  . Unspecified cataract 06/21/2012  . Cataract 06/21/2012  . Can't get food down 06/21/2012  . Essential (primary) hypertension 06/21/2012    Palliative Care Assessment & Plan     Recommendations/Plan:  Dopamine stopped.  Full comfort care.   Morphine 1-17m q 15 min PRN for air hunger or pain.  Ativan 0.5257mq 4 hours PRN for anxiety  Robinul 0.57m30m 4 hours for excessive secretions.     Code Status:    Code Status Orders  (From admission, onward)         Start     Ordered   03/05/2018 2239  Full code  Continuous     02/20/2018 2238        Code Status History    Date Active Date Inactive Code Status Order ID Comments User Context   05/24/2017 1730 05/25/2017 2110 Full Code [id number] FlockD ED   09/19/2013 1721 09/20/2013 1700 Full Code [id number] EaringA-C Inpatient    Advance Directive Documentation     Most Recent Value  Type of Advance Directive  Healthcare Power of Attorney  Pre-existing out of facility DNR order (yellow form or pink MOST form)  -  "MOST" Form in Place?  -       Prognosis:  Anticipated hospital death.  Cardiac arrest with resusitation on admission. Respiratory arrest 9/13. Initiation of dialysis this admissions. Parkinson's.   Discharge Planning:  Anticipated hospital death.   Care plan was discussed with RN  Thank you for allowing the Palliative Medicine Team to assist in the care of this patient.   10:00-11:15 11:30-11:55 2:00-2:15  Total Time 75 min 25 min 15 min  115 min  Prolonged  Time Billed  yes      Greater than 50%  of this time was spent counseling and coordinating care related to the above assessment and plan.  Asencion Gowda, NP  Please contact Palliative Medicine Team phone at [phone] for questions and concerns.

## 2018-03-11 NOTE — Progress Notes (Signed)
With patient being DNR/DNI and soon transitioning to Comfort care, patients family has requested that patient not be turned Q2H and that any dressing changes not be done. This includes central line dressing that is due during shift and placing a new pink foam on patients sacrum. Family will request when patient would like to be repositioned or when pain/anxiety medications be administered.

## 2018-03-11 NOTE — Progress Notes (Signed)
   03/11/18 0955  Clinical Encounter Type  Visited With Family;Patient and family together  Visit Type Follow-up  Referral From Family  Consult/Referral To [place] encountered patient's daughter in hallway as she was walking.  She spoke of arrival of patient's sister last night and that sister and patient's son are at bedside.  Chaplain checked in with patient and family.  Patient was resting and family was having some quiet time.  Chaplain expressed intention to check in throughout the day with family to which they agreed.

## 2018-03-11 NOTE — Progress Notes (Signed)
[facility]  ROUNDING NOTE   Subjective:   Patient to be transferred to comfort care.   Family meeting yesterday with son and daughter.   Hemodialysis treatment yesterday. UF of 1 liter. Anuric.   Dopamine gtt   Objective:  Vital signs in last 24 hours:  Temp:  [97.1 F (36.2 C)-97.5 F (36.4 C)] 97.2 F (36.2 C) (09/20 0800) Pulse Rate:  [81-109] 92 (09/20 0800) Resp:  [13-37] 16 (09/20 0800) BP: (84-159)/(14-99) 117/49 (09/20 0800) SpO2:  [76 %-100 %] 100 % (09/20 0800) Weight:  [56.8 kg-58.9 kg] 56.8 kg (09/19 1425)  Weight change: -0.9 kg Filed Weights   03/10/18 0500 03/10/18 1050 03/10/18 1425  Weight: 59.2 kg 58.9 kg 56.8 kg    Intake/Output: I/O last 3 completed shifts: In: 592.6 [P.O.:360; I.V.:232.6] Out: 1009 [Other:1009]   Intake/Output this shift:  No intake/output data recorded.  Physical Exam: General: Mild respiratory distress  Head: Normocephalic, atraumatic.  Oral mucosa moist  Eyes: Anicteric  Neck: Supple, trachea midline  Lungs:  Scattered rhonchi, crackles  Heart: S1S2 no rubs  Abdomen:  Soft, nontender, bowel sounds present  Extremities: No peripheral edema.  Neurologic: Alert to self and place  Skin: No lesions  Access:  R IJ permcath    Basic Metabolic Panel: Recent Labs  Lab 03/06/18 0018 03/06/18 2000 03/07/18 0529 03/08/18 0120 03/08/18 0429 03/10/18 0413  NA 142 138 141  --  142 142  K 3.0* 3.3* 3.5  --  3.8 3.9  CL 109 105 108  --  109 105  CO2 27 28 28   --  27 30  GLUCOSE 120* 104* 104*  --  113* 92  BUN 32* 38* 38*  --  40* 30*  CREATININE 1.97* 2.47* 2.43*  --  2.50* 2.63*  CALCIUM 7.5* 8.1* 7.8*  --  8.1* 8.3*  MG  --  2.1  --   --   --   --   PHOS 2.5 4.0  --  3.3  --   --     Liver Function Tests: Recent Labs  Lab 03/06/18 0018 03/06/18 2000  AST  --  20  ALT  --  8  ALKPHOS  --  56  BILITOT  --  0.6  PROT  --  4.7*  ALBUMIN 2.3* 2.3*   No results for input(s): LIPASE, AMYLASE in  the last 168 hours. No results for input(s): AMMONIA in the last 168 hours.  CBC: Recent Labs  Lab 03/05/18 0853 03/06/18 1134 03/06/18 2000 03/08/18 0429 03/10/18 0413  WBC 5.2 4.8 4.1 4.6 5.8  NEUTROABS 4.0 3.9  --  3.4 4.5  HGB 7.6* 7.4* 7.4* 7.0* 7.3*  HCT 22.4* 21.9* 21.7* 20.7* 21.3*  MCV 93.4 94.6 93.7 94.8 93.7  PLT 110* 113* 110* 135* 173    Cardiac Enzymes: Recent Labs  Lab 03/04/18 1750  TROPONINI 0.71*    BNP: Invalid input(s): POCBNP  CBG: Recent Labs  Lab 03/09/18 2003 03/09/18 2336 03/10/18 0403 03/10/18 0738 03/10/18 1231  GLUCAP 82 97 81 80 96    Microbiology: Results for orders placed or performed during the hospital encounter of 02/28/2018  Blood culture (routine x 2)     Status: None   Collection Time: 03/20/2018  7:54 PM  Result Value Ref Range Status   Specimen Description BLOOD RIGHT WRIST  Final   Special Requests   Final    BOTTLES DRAWN AEROBIC AND ANAEROBIC Blood Culture results may not be optimal due  to an excessive volume of blood received in culture bottles   Culture   Final    NO GROWTH 5 DAYS Performed at [facility], [address] [place], [place] [address]    Report Status 03/04/2018 FINAL  Final  MRSA PCR Screening     Status: None   Collection Time: 02/23/2018 10:39 PM  Result Value Ref Range Status   MRSA by PCR NEGATIVE NEGATIVE Final    Comment:        The GeneXpert MRSA Assay (FDA approved for NASAL specimens only), is one component of a comprehensive MRSA colonization surveillance program. It is not intended to diagnose MRSA infection nor to guide or monitor treatment for MRSA infections. Performed at [facility], [address] [place], [place] [address]   Blood culture (routine x 2)     Status: None   Collection Time: 03/08/2018 11:12 PM  Result Value Ref Range Status   Specimen Description BLOOD LEFT ARM  Final   Special Requests   Final    BOTTLES DRAWN AEROBIC AND ANAEROBIC Blood  Culture results may not be optimal due to an inadequate volume of blood received in culture bottles   Culture   Final    NO GROWTH 5 DAYS Performed at [facility], [address] [place], [place] [address]    Report Status 03/04/2018 FINAL  Final    Coagulation Studies: No results for input(s): LABPROT, INR in the last 72 hours.  Urinalysis: No results for input(s): COLORURINE, LABSPEC, PHURINE, GLUCOSEU, HGBUR, BILIRUBINUR, KETONESUR, PROTEINUR, UROBILINOGEN, NITRITE, LEUKOCYTESUR in the last 72 hours.  Invalid input(s): APPERANCEUR    Imaging: Dg Chest Port 1 View  Result Date: 03/10/2018 CLINICAL DATA:  Acute respiratory failure. EXAM: PORTABLE CHEST 1 VIEW COMPARISON:  Radiograph of March 06, 2018. FINDINGS: Stable cardiomegaly. Atherosclerosis of thoracic aorta is noted. Right internal jugular dialysis catheter is unchanged in position. No pneumothorax is noted. Stable large hiatal hernia. Stable bibasilar opacities are noted concerning for atelectasis or infiltrates with associated pleural effusion. Bony thorax is unremarkable. IMPRESSION: Stable large hiatal hernia. Stable bibasilar opacities as described above. Electronically Signed   By: Marijo Conception, M.D.   On: 03/10/2018 09:51     Medications:   . sodium chloride Stopped (03/08/18 0443)  . sodium chloride    . sodium chloride    . dextrose 5 % and 0.45% NaCl Stopped (03/10/18 1657)  . DOPamine 5 mcg/kg/min (03/11/18 0300)   . Chlorhexidine Gluconate Cloth  6 each Topical Q0600  . mouth rinse  15 mL Mouth Rinse BID  . pantoprazole (PROTONIX) IV  40 mg Intravenous Q12H  . sodium chloride flush  10-40 mL Intracatheter Q12H   sodium chloride, sodium chloride, sodium chloride, alteplase, ipratropium-albuterol, lidocaine (PF), lidocaine-prilocaine, LORazepam, morphine injection, pentafluoroprop-tetrafluoroeth, sodium chloride flush  Assessment/ Plan:  77 y.o. white female with hypertension, Parkinson's  disorder, GI bleed, overactive bladder, iron deficiency anemia, hyperlipidemia, chronic musculoskeletal low back pain, chronic lower extremity edema, GERD, right basal ganglia CVA and vitamin D deficiency admitted with cardiac arrest.   1.  End-stage renal disease. Overall poor prognosis. Will discontinue intermittent hemodialysis at this time.   2.  Anemia of chronic kidney disease.  Hemoglobin 7.3   3.  Secondary hyperparathyroidism. PTH 88 - low for level of kidney function. Corrected calcium and phosphorus at goal. Not currently on vitamin D agents.   4. Hypotension: currently placed on dopamine gtt.    LOS: 12 Sidda Humm  9/20/20198:52 AM

## 2018-03-11 NOTE — Plan of Care (Signed)
Son and daughter are at bedside, deny any needs at present. Patient appears to be in no distress and resting comfortably with morphine gtt infusing. Patient is on [place] at 3 liters, RR 10-12.

## 2018-03-11 NOTE — Progress Notes (Signed)
   03/11/18 1340  Clinical Encounter Type  Visited With Family  Visit Type Follow-up   Chaplain encountered patient's sister exiting unit as chaplain was entering.  Sister reported that patient's daughter Otila Kluver was at bedside.  Chaplain checked in with Otila Kluver who indicated that she would like some time alone with the patient.  Chaplain will continue to monitor.

## 2018-03-11 NOTE — Progress Notes (Signed)
SLP Cancellation Note  Patient Details Name: Brianna [place] MRN: [id number] DOB: March 13, 1941   Cancelled treatment:       Reason Eval/Treat Not Completed: Patient not medically ready(chart reviewed; pt has transferred to full Comfort Care). NSG to reconsult if ST needs indicated.    Orinda Kenner, MS, CCC-SLP Lashawnda Hancox 03/11/2018, 2:40 PM

## 2018-03-12 MED ORDER — GLYCOPYRROLATE 0.2 MG/ML IJ SOLN
0.1000 mg | Freq: Once | INTRAMUSCULAR | Status: DC
  Filled 2018-03-12: qty 1

## 2018-03-12 MED ORDER — GLYCOPYRROLATE 0.2 MG/ML IJ SOLN
0.2000 mg | Freq: Once | INTRAMUSCULAR | Status: AC
  Administered 2018-03-12: 0.2 mg via INTRAVENOUS

## 2018-03-14 ENCOUNTER — Telehealth: Payer: Self-pay

## 2018-03-14 NOTE — Telephone Encounter (Signed)
[employer] dropped off Death Certificate to be completed and signed °Placed in nurse box °

## 2018-03-14 NOTE — Telephone Encounter (Signed)
Death certificate placed in MD folder. DK 

## 2018-03-15 NOTE — Telephone Encounter (Signed)
Funeral home informed death certs are ready.

## 2018-03-17 ENCOUNTER — Telehealth: Payer: Self-pay | Admitting: *Deleted

## 2018-03-17 ENCOUNTER — Telehealth: Payer: Self-pay | Admitting: Neurology

## 2018-03-17 NOTE — Telephone Encounter (Signed)
Events noted, the patient was admitted with cardiac arrest.

## 2018-03-17 NOTE — Telephone Encounter (Signed)
Pt's daughter Otila Kluver called said her mother passed on 10/10/2022. She wanted Dr Viona Gilmore to know. I gave her the clinic condolence.

## 2018-03-17 NOTE — Telephone Encounter (Signed)
Daughter Otila Kluver called to report that patient exp 25-Mar-2018 at [facility]

## 2018-03-22 NOTE — Progress Notes (Addendum)
Maggie, NP notified that patient has expired. Patient has no respirations noted, asystole on the monitor, no palpable pulse detected and pupils are fixed and dilated. Family is at bedside and decline chaplin to be called.

## 2018-03-22 NOTE — Progress Notes (Signed)
Patient has expired. Patient has no respirations noted, asystole on the monitor, no palpable pulse detected and pupils are fixed and dilated. Family is at bedside and decline chaplin to be called.

## 2018-03-22 NOTE — Progress Notes (Signed)
AC notified that patient has expired. Family present at bedside.

## 2018-03-22 NOTE — Progress Notes (Signed)
Spoke with Burman Nieves, NP verbal order to increase morphine gtt to 5mg /hr and give Robinul IV 0.2 mg once now. Continue to monitor patient closely.

## 2018-03-22 NOTE — Death Summary Note (Signed)
DEATH SUMMARY   Patient Details  Name: Brianna [place] MRN: [id number] DOB: Nov 11, 1940  Admission/Discharge Information   Admit Date:  03-13-2018  Date of Death: Date of Death: 26-Mar-2018  Time of Death: Time of Death: 0616  Length of Stay: 16-Sep-2022  Referring Physician: Maryland Pink, MD   Reason(s) for Hospitalization  Acute hypoxic respiratory failure and cardiac arrest  Diagnoses  Preliminary cause of death: Acute hypoxic respiratory failure Secondary Diagnoses (including complications and co-morbidities):  Principal Problem:   Cardiac arrest [facility]) Active Problems:   Essential (primary) hypertension   HLD (hyperlipidemia)   Parkinson's disease ([place])   Acute renal failure superimposed on chronic kidney disease ([place])   Protein-calorie malnutrition, severe   Acidosis   Acute respiratory failure [facility])   [place] Hospital Course (including significant findings, care, treatment, and services provided and events leading to death)  Brianna Coleman is a 77 y.o. year old female who was admitted initially with worsening abdominal pain, nausea and vomiting.  She had a brief episode of cardiac arrest in the emergency room and was effectively resuscitated and admitted to the ICU.  Her ED work-up showed  a non-ST elevation MI, pleural effusions renal failure with a creatinine greater than 3.  She was maintained on BiPAP, heparin, broad-spectrum antibiotics but deteriorated requiring intubation.  Was subsequently extubated but unable to maintain her airway due to severe dysphagia and recurrent aspiration.  She was seen by nephrology and started on hemodialysis.  Hemodialysis was complicated by hypotension requiring pressors.  Despite treatment, patient continued to deteriorate.  Family decided to make her full comfort care.  End of life care orders were initiated and patient expired this morning 6:16 AM  Pertinent Labs and Studies  Significant Diagnostic Studies Dg Abd 1 View  Result Date:  03/03/2018 CLINICAL DATA:  OG placement EXAM: ABDOMEN - 1 VIEW COMPARISON:  02/28/2018 FINDINGS: A large hiatal hernia is noted. The esophageal tube tip is in the left upper quadrant and projects over the stomach. Pleural effusions and basilar airspace disease. Cardiomegaly. IMPRESSION: Esophageal tube tip projects below the diaphragm and is presumably within the gastric body Electronically Signed   By: Donavan Foil M.D.   On: 03/03/2018 23:11   Dg Abd 1 View  Result Date: 02/28/2018 CLINICAL DATA:  Abdominal pain EXAM: ABDOMEN - 1 VIEW COMPARISON:  None. FINDINGS: Gaseous distention of the stomach and small bowel. No dilated small bowel is visualized. Incompletely visualized cardiomegaly. IMPRESSION: Gas distended stomach. No radiographic evidence of small-bowel obstruction. Electronically Signed   By: Ulyses Jarred M.D.   On: 02/28/2018 00:11   Ct Head Wo Contrast  Result Date: 2018-03-13 CLINICAL DATA:  Altered level of consciousness, unexplained. EXAM: CT HEAD WITHOUT CONTRAST TECHNIQUE: Contiguous axial images were obtained from the base of the skull through the vertex without intravenous contrast. COMPARISON:  Head CT 11/30/2017 FINDINGS: Brain: No intracranial hemorrhage, mass effect, or midline shift. Unchanged atrophy and chronic small vessel ischemia. Unchanged remote lacunar infarcts in the right greater than left basal ganglia. Small remote left parietal infarct again seen. No cerebral edema. No hydrocephalus. The basilar cisterns are patent. No evidence of territorial infarct or acute ischemia. No extra-axial or intracranial fluid collection. Vascular: Atherosclerosis of skullbase vasculature without hyperdense vessel or abnormal calcification. Skull: No fracture or focal lesion. Sinuses/Orbits: Paranasal sinuses and mastoid air cells are clear. The visualized orbits are unremarkable. Other: None. IMPRESSION: 1.  No acute intracranial abnormality. 2. Unchanged atrophy, chronic small vessel  ischemia, and  remote lacunar infarcts in the basal ganglia. Electronically Signed   By: Keith Rake M.D.   On: 03/21/2018 21:08   Dg Chest Port 1 View  Result Date: 03/10/2018 CLINICAL DATA:  Acute respiratory failure. EXAM: PORTABLE CHEST 1 VIEW COMPARISON:  Radiograph of March 06, 2018. FINDINGS: Stable cardiomegaly. Atherosclerosis of thoracic aorta is noted. Right internal jugular dialysis catheter is unchanged in position. No pneumothorax is noted. Stable large hiatal hernia. Stable bibasilar opacities are noted concerning for atelectasis or infiltrates with associated pleural effusion. Bony thorax is unremarkable. IMPRESSION: Stable large hiatal hernia. Stable bibasilar opacities as described above. Electronically Signed   By: Marijo Conception, M.D.   On: 03/10/2018 09:51   Dg Chest Port 1 View  Result Date: 03/06/2018 CLINICAL DATA:  Acute respiratory failure EXAM: PORTABLE CHEST 1 VIEW COMPARISON:  9/13/9 FINDINGS: Cardiac shadow is again enlarged. The endotracheal tube and nasogastric catheter has been removed. Large hiatal hernia is again identified. Right dialysis catheter is again seen. Stable bibasilar infiltrates are noted. No new focal abnormality is seen. IMPRESSION: Stable bibasilar infiltrates. Electronically Signed   By: Inez Catalina M.D.   On: 03/06/2018 07:32   Dg Chest Port 1 View  Result Date: 03/04/2018 CLINICAL DATA:  Acute respiratory failure EXAM: PORTABLE CHEST 1 VIEW COMPARISON:  03/03/2018 FINDINGS: Endotracheal tube, nasogastric catheter and dialysis catheter are again identified and stable. The nasogastric catheter is looped but remains within the stomach in a large hiatal hernia. Cardiac enlargement is again seen and stable. Bibasilar consolidation is again identified and stable. No new focal abnormality is noted. IMPRESSION: No significant change from the previous day. Bibasilar consolidation is again seen. Electronically Signed   By: Inez Catalina M.D.   On:  03/04/2018 08:47   Dg Chest Port 1 View  Result Date: 03/03/2018 CLINICAL DATA:  Intubated EXAM: PORTABLE CHEST 1 VIEW COMPARISON:  03/03/2018, 03/09/2018, 12/18/2016 FINDINGS: Endotracheal tube tip is about 9 mm superior to the carina. Esophageal tube tip projects over the stomach, a large hiatal hernia is noted with decreased gastric distention. Right-sided central venous catheter tip over the right atrium. Cardiomegaly with vascular congestion, pleural effusion and pulmonary edema. Bibasilar consolidation. No pneumothorax. IMPRESSION: 1. Endotracheal tube tip about 9 mm superior to carina 2. Cardiomegaly with worsened vascular congestion and pulmonary edema. Small pleural effusions with continued basilar consolidations. 3. Decreased gaseous dilatation of large hiatal hernia. Electronically Signed   By: Donavan Foil M.D.   On: 03/03/2018 23:13   Dg Chest Port 1 View  Result Date: 03/03/2018 CLINICAL DATA:  Evaluate for pneumonia. EXAM: PORTABLE CHEST 1 VIEW COMPARISON:  02/26/2018 FINDINGS: Right dialysis catheter in place with the tip in the right atrium. Cardiomegaly with vascular congestion. Very low lung volumes with bibasilar atelectasis and marked gaseous distention of the stomach. No visible effusions or acute bony abnormality. IMPRESSION: Marked gaseous distention of the stomach. Very low lung volumes with bibasilar atelectasis. Cardiomegaly, vascular congestion. Electronically Signed   By: Rolm Baptise M.D.   On: 03/03/2018 10:48   Dg Chest Port 1 View  Result Date: 03/08/2018 CLINICAL DATA:  Cardiac arrest. EXAM: PORTABLE CHEST 1 VIEW COMPARISON:  Radiograph earlier this day. FINDINGS: Cardiac enlargement partially obscured by large retrocardiac hiatal hernia. Slight decreased gaseous gastric distension from prior. Aortic atherosclerosis. Mild vascular congestion without overt pulmonary edema. Probable bibasilar atelectasis with possible small pleural effusions. Improved linear right lung  base atelectasis from prior. No pneumothorax. IMPRESSION: 1. Probable bibasilar atelectasis and possible  pleural effusions. Vascular congestion. 2. Cardiac enlargement, partially obscured by large hiatal hernia. Slight decreased gaseous gastric distension from prior. Electronically Signed   By: Keith Rake M.D.   On: 02/21/2018 23:22   Dg Chest Portable 1 View  Result Date: 03/13/2018 CLINICAL DATA:  Respiratory distress. EXAM: PORTABLE CHEST 1 VIEW COMPARISON:  12/18/2016. FINDINGS: Stable enlarged cardiac silhouette. Interval gaseous distention of the stomach, including a large hiatal hernia. Interval linear atelectasis at the right lung base. Clear left lung. Diffuse osteopenia. IMPRESSION: 1. Interval gaseous distention of the stomach, including a large hiatal hernia. 2. Interval linear atelectasis at the right lung base. Electronically Signed   By: Claudie Revering M.D.   On: 03/16/2018 19:18   Dg Abd Portable 1v  Result Date: 02/28/2018 CLINICAL DATA:  Nasogastric tube placement EXAM: PORTABLE ABDOMEN - 1 VIEW COMPARISON:  Portable exam 0138 hours compared to 03/07/2018 and correlated with prior CT abdomen and pelvis exam of 12/18/2016 FINDINGS: Nasogastric tube coiled in a large hiatal hernia at the lower LEFT hemithorax. Gaseous distention of the distal stomach below the diaphragm. Air-filled loops of nondistended bowel throughout abdomen and pelvis. Bones demineralized. External pacing leads noted. IMPRESSION: Tip of nasogastric tube is coiled within a large hiatal hernia at the inferior LEFT chest. Electronically Signed   By: Lavonia Dana M.D.   On: 02/28/2018 02:00   [place] Ekg Site Rite  Result Date: 03/08/2018 If Site Rite image not attached, placement could not be confirmed due to current cardiac rhythm.   Microbiology No results found for this or any previous visit (from the past 240 hour(s)).  Lab Basic Metabolic Panel: Recent Labs  Lab 03/06/18 0018 03/06/18 2000 03/07/18 0529  03/08/18 0120 03/08/18 0429 03/10/18 0413  NA 142 138 141  --  142 142  K 3.0* 3.3* 3.5  --  3.8 3.9  CL 109 105 108  --  109 105  CO2 27 28 28   --  27 30  GLUCOSE 120* 104* 104*  --  113* 92  BUN 32* 38* 38*  --  40* 30*  CREATININE 1.97* 2.47* 2.43*  --  2.50* 2.63*  CALCIUM 7.5* 8.1* 7.8*  --  8.1* 8.3*  MG  --  2.1  --   --   --   --   PHOS 2.5 4.0  --  3.3  --   --    Liver Function Tests: Recent Labs  Lab 03/06/18 0018 03/06/18 2000  AST  --  20  ALT  --  8  ALKPHOS  --  56  BILITOT  --  0.6  PROT  --  4.7*  ALBUMIN 2.3* 2.3*   No results for input(s): LIPASE, AMYLASE in the last 168 hours. No results for input(s): AMMONIA in the last 168 hours. CBC: Recent Labs  Lab 03/05/18 0853 03/06/18 1134 03/06/18 2000 03/08/18 0429 03/10/18 0413  WBC 5.2 4.8 4.1 4.6 5.8  NEUTROABS 4.0 3.9  --  3.4 4.5  HGB 7.6* 7.4* 7.4* 7.0* 7.3*  HCT 22.4* 21.9* 21.7* 20.7* 21.3*  MCV 93.4 94.6 93.7 94.8 93.7  PLT 110* 113* 110* 135* 173   Cardiac Enzymes: No results for input(s): CKTOTAL, CKMB, CKMBINDEX, TROPONINI in the last 168 hours. Sepsis Labs: Recent Labs  Lab 03/06/18 1134 03/06/18 2000 03/08/18 0429 03/10/18 0413  WBC 4.8 4.1 4.6 5.8    Procedures/Operations  Central venous catheter Intubation and extubation  Maxima Skelton S. Tukov ANP-BC Pulmonary and [place]  Pager [phone] or [phone]  NB: This document was prepared using Dragon voice recognition software and may include unintentional dictation errors.   03-27-18, 8:18 AM

## 2018-03-22 DEATH — deceased

## 2018-04-01 ENCOUNTER — Other Ambulatory Visit: Payer: Medicare Other

## 2018-04-04 ENCOUNTER — Ambulatory Visit: Payer: Medicare Other

## 2018-04-04 ENCOUNTER — Ambulatory Visit: Payer: Medicare Other | Admitting: Oncology

## 2019-10-06 IMAGING — DX DG ABDOMEN 1V
2 series · 2 of 2 positions shown · non-contrast
Comparison: None.

CLINICAL DATA: Abdominal pain

EXAM:
ABDOMEN - 1 VIEW

[abdomen supine (1 of 2)]
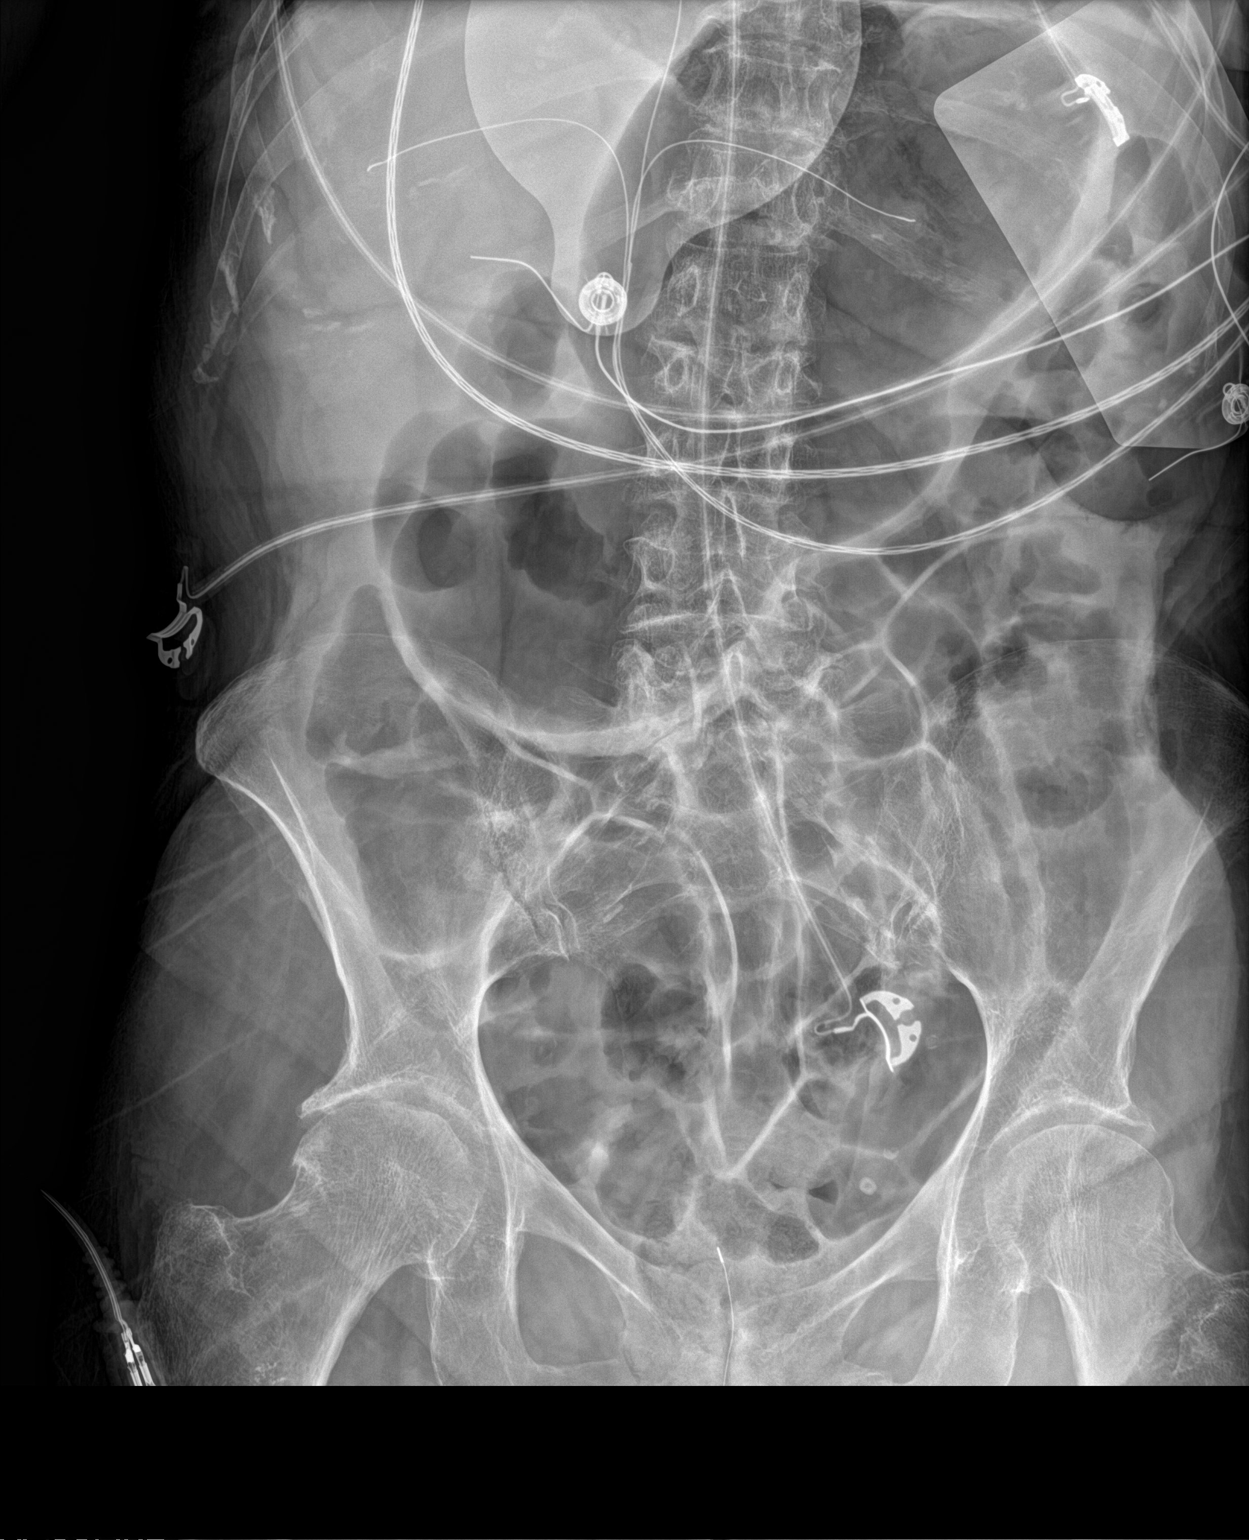

[abdomen supine (2 of 2)]
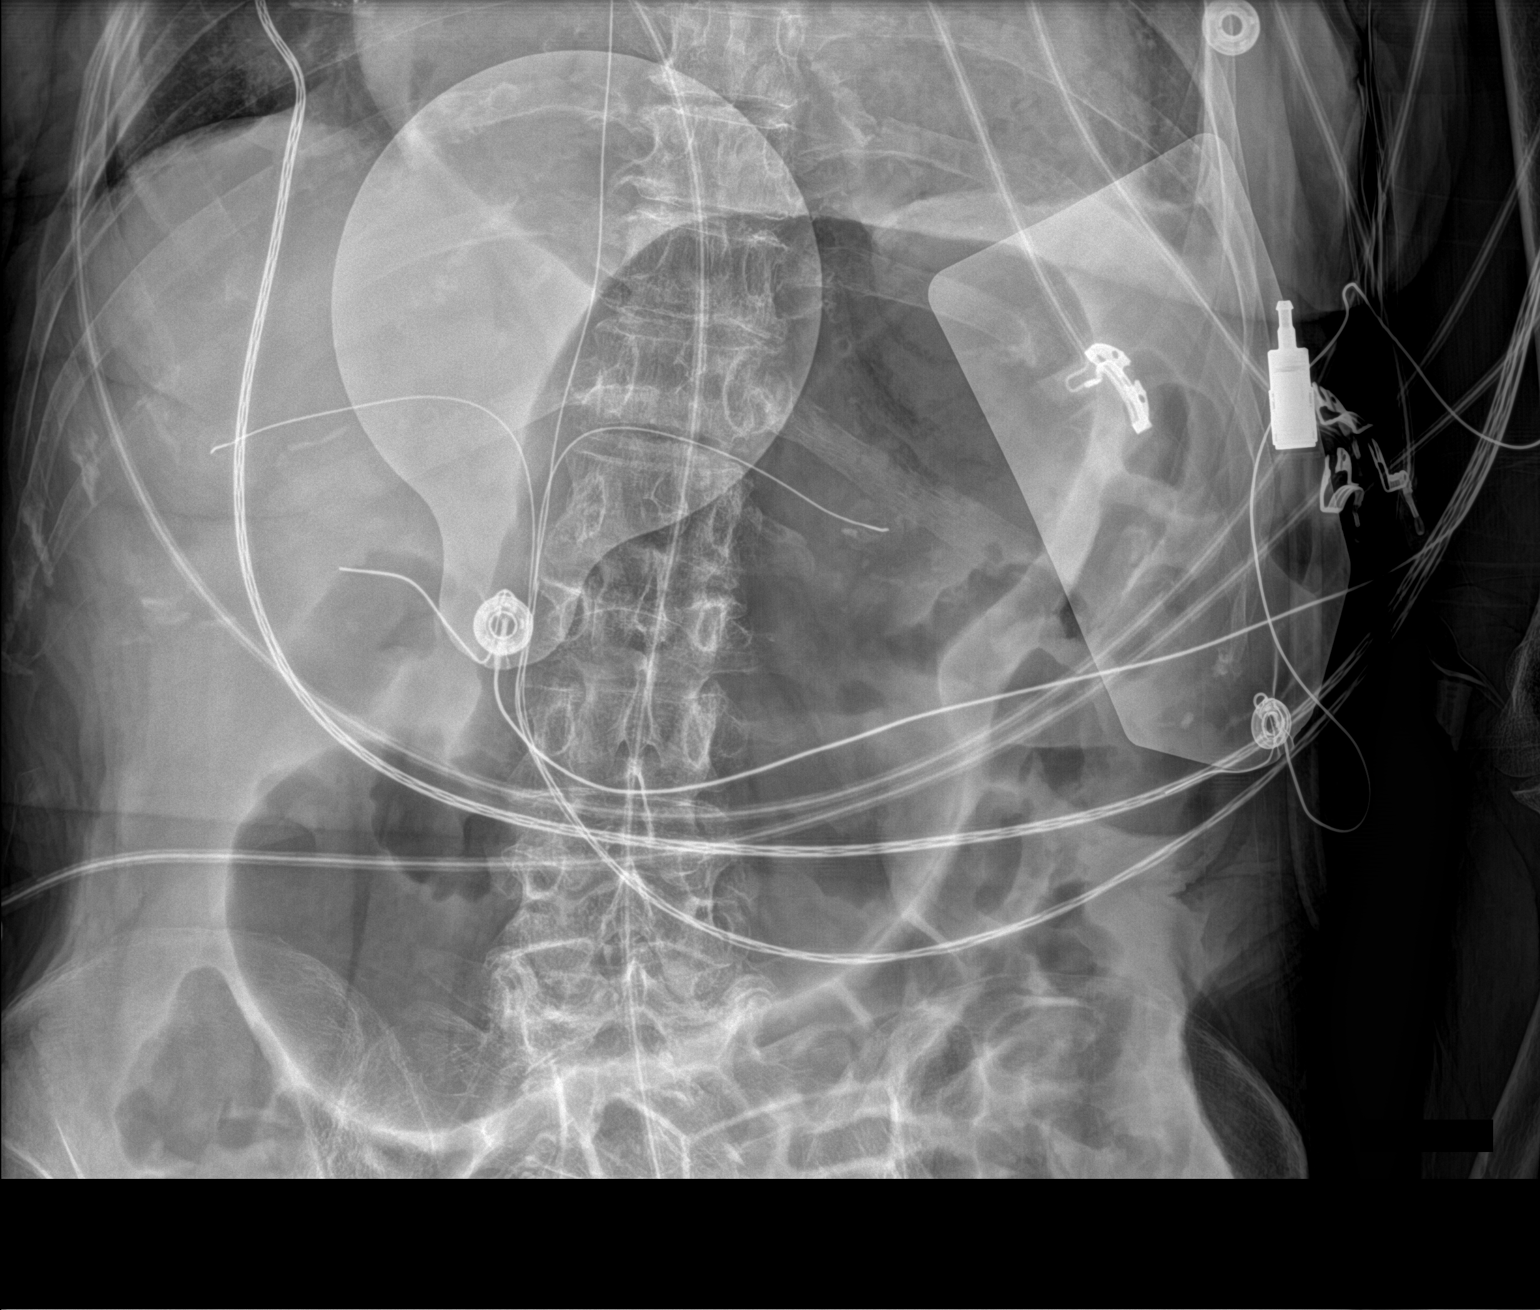

[2 of 2 positions shown; findings below may reference images not displayed]

FINDINGS: Gaseous distention of the stomach and small bowel. No dilated small
bowel is visualized. Incompletely visualized cardiomegaly.
IMPRESSION: Gas distended stomach. No radiographic evidence of small-bowel
obstruction.

## 2019-10-06 IMAGING — CT CT HEAD W/O CM
3 series · 16 of 46 positions shown, 19 images · non-contrast
Comparison: Head CT 11/30/2017

CLINICAL DATA: Altered level of consciousness, unexplained.

EXAM:
CT HEAD WITHOUT CONTRAST
TECHNIQUE: Contiguous axial images were obtained from the base of the skull
through the vertex without intravenous contrast.

[Series 3: head wo · axial · 0.41mm/px · z∈[+424,+544]mm · 10 of 29 slices shown, 13 images]
[im 3/29  brain]
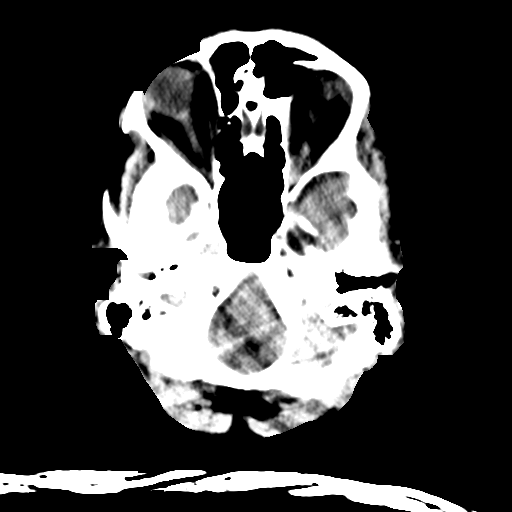
[im 3/29  bone]
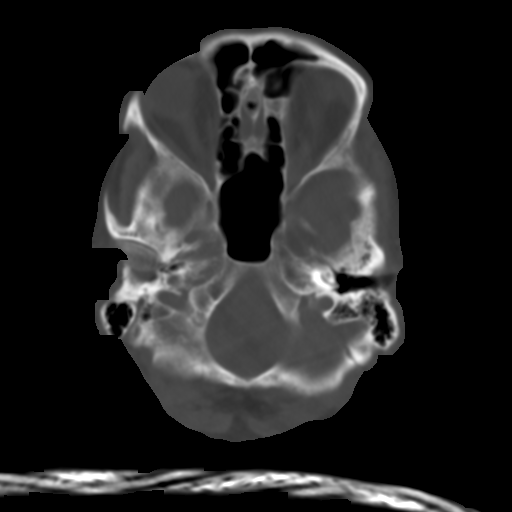
[im 6/29  brain]
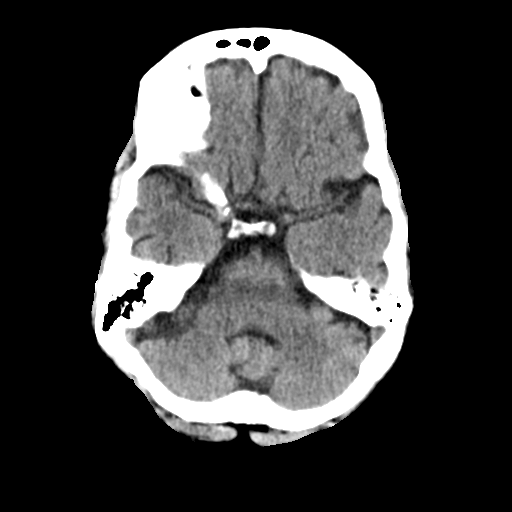
[im 8/29  brain]
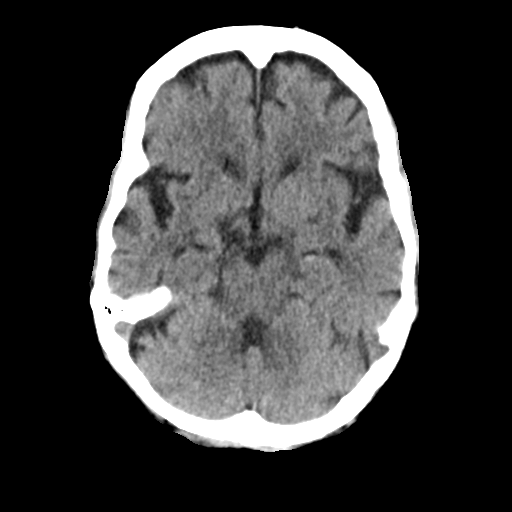
[im 11/29  brain]
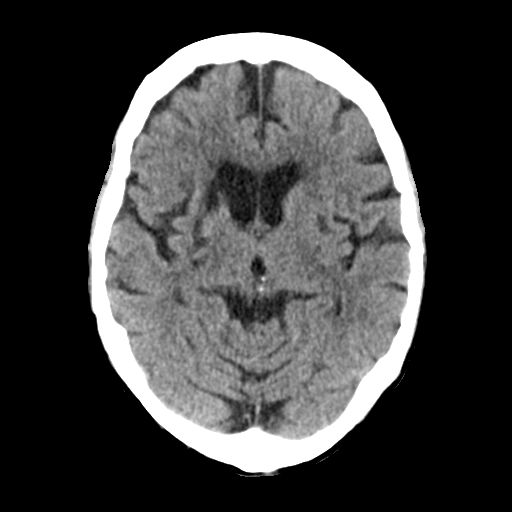
[im 14/29  brain]
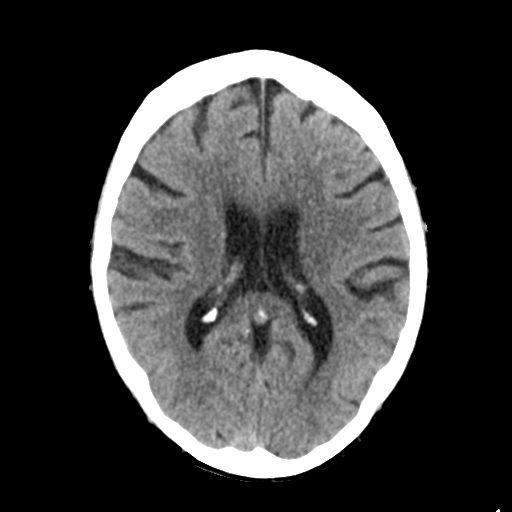
[im 14/29  bone]
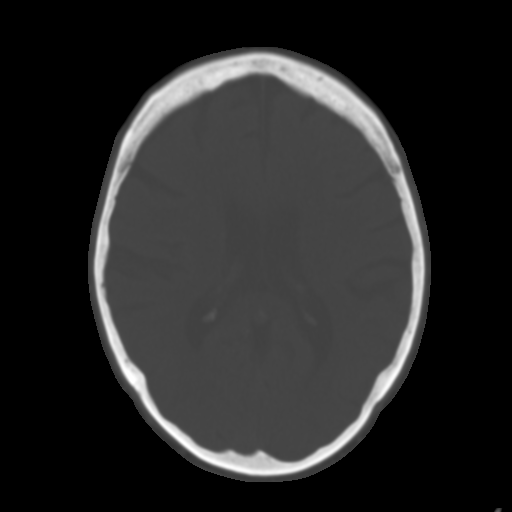
[im 16/29  brain]
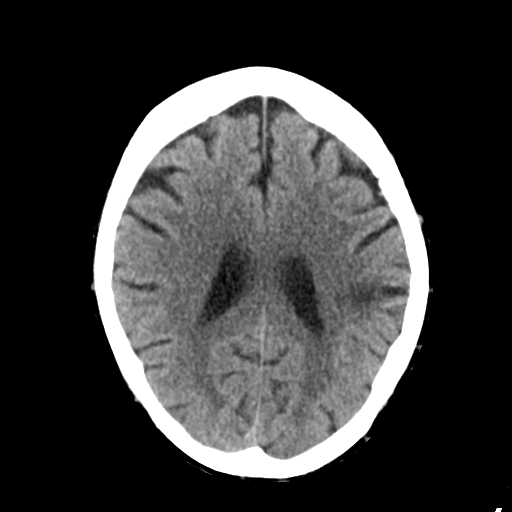
[im 19/29  brain]
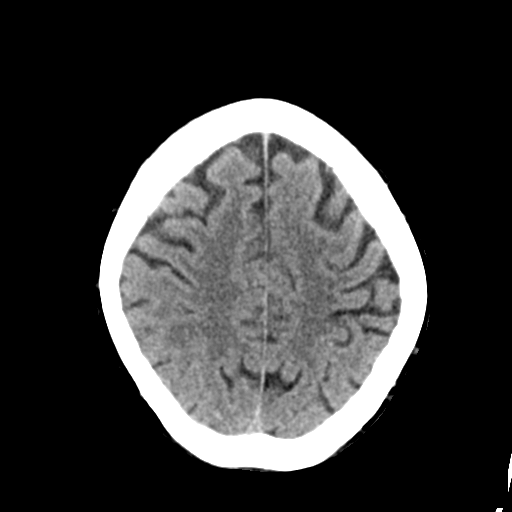
[im 22/29  brain]
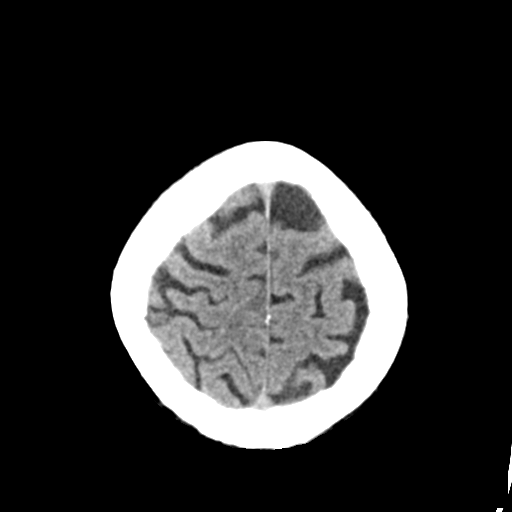
[im 24/29  brain]
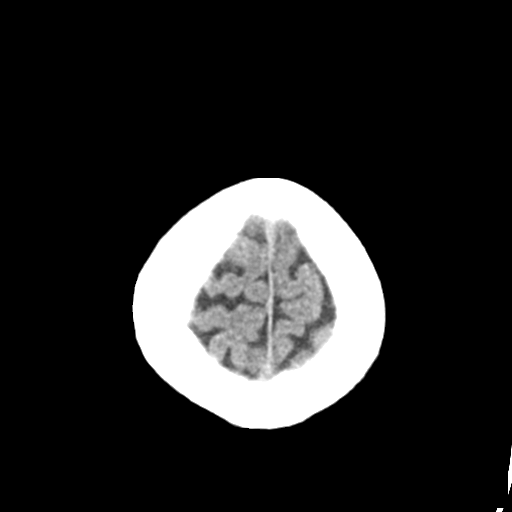
[im 24/29  bone]
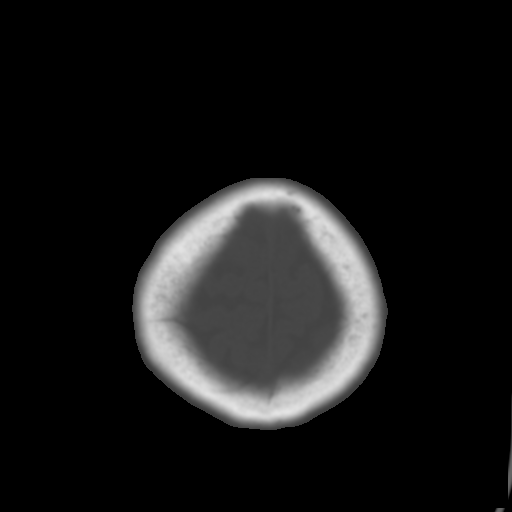
[im 27/29  brain]
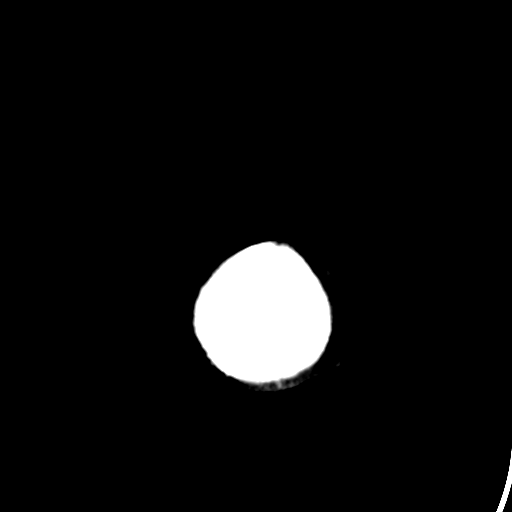

[Series 4: coronal soft tissue · coronal · 0.28mm/px · 3 of 63 slices shown]
[im 21/63  brain]
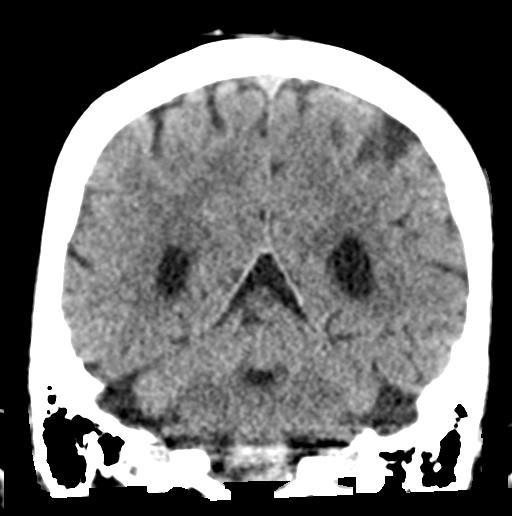
[im 28/63  brain]
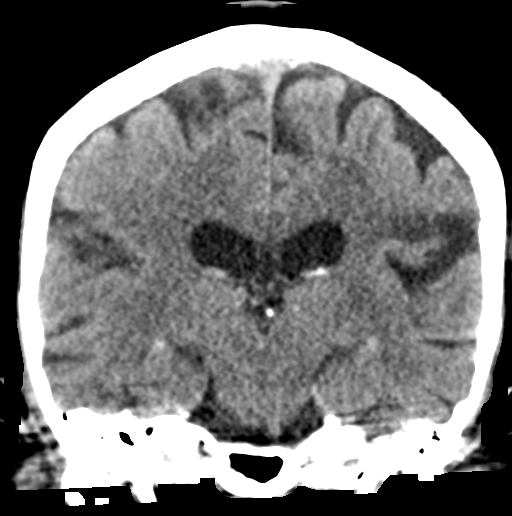
[im 35/63  brain]
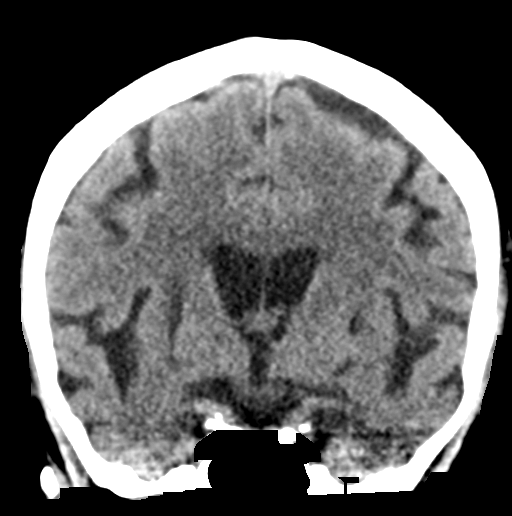

[Series 5: sagittal soft tissue · sagittal · 0.28mm/px · 3 of 49 slices shown]
[im 17/49  brain]
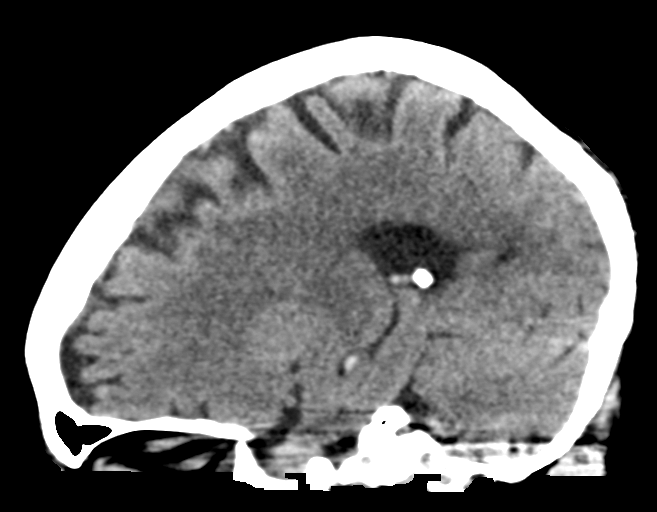
[im 25/49  brain]
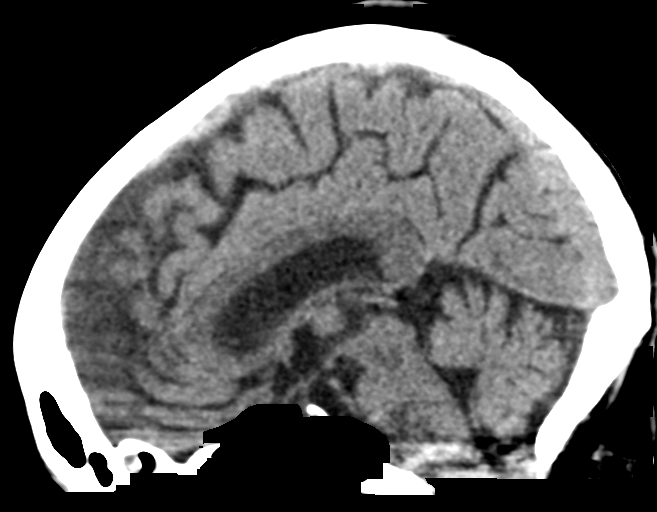
[im 33/49  brain]
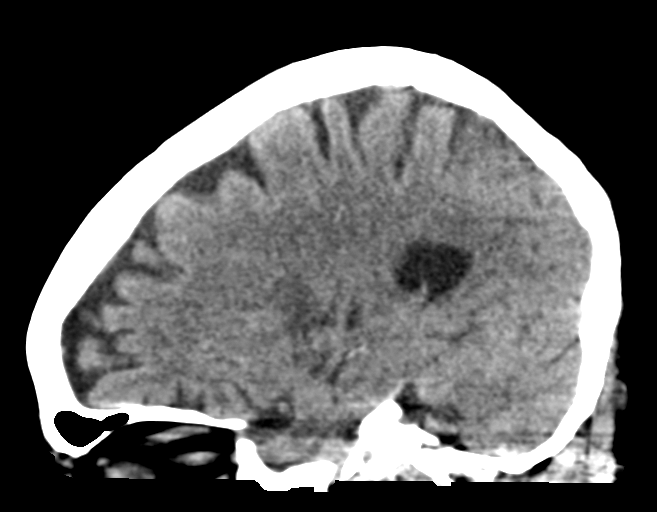

[16 of 46 positions shown; findings below may reference images not displayed]

FINDINGS: Brain: No intracranial hemorrhage, mass effect, or midline shift.
Unchanged atrophy and chronic small vessel ischemia. Unchanged
remote lacunar infarcts in the right greater than left basal
ganglia. Small remote left parietal infarct again seen. No cerebral
edema. No hydrocephalus. The basilar cisterns are patent. No
evidence of territorial infarct or acute ischemia. No extra-axial or
intracranial fluid collection.

Vascular: Atherosclerosis of skullbase vasculature without
hyperdense vessel or abnormal calcification.

Skull: No fracture or focal lesion.

Sinuses/Orbits: Paranasal sinuses and mastoid air cells are clear.
The visualized orbits are unremarkable.

Other: None.
IMPRESSION: 1.  No acute intracranial abnormality.
2. Unchanged atrophy, chronic small vessel ischemia, and remote
lacunar infarcts in the basal ganglia.

## 2019-10-07 IMAGING — DX DG ABD PORTABLE 1V
2 series · 2 of 2 positions shown · non-contrast
Comparison: Portable exam 9466 hours compared to 02/27/2018 and
correlated with prior CT abdomen and pelvis exam of 12/18/2016

CLINICAL DATA: Nasogastric tube placement

EXAM:
PORTABLE ABDOMEN - 1 VIEW

[abdomen supine (1 of 2)]
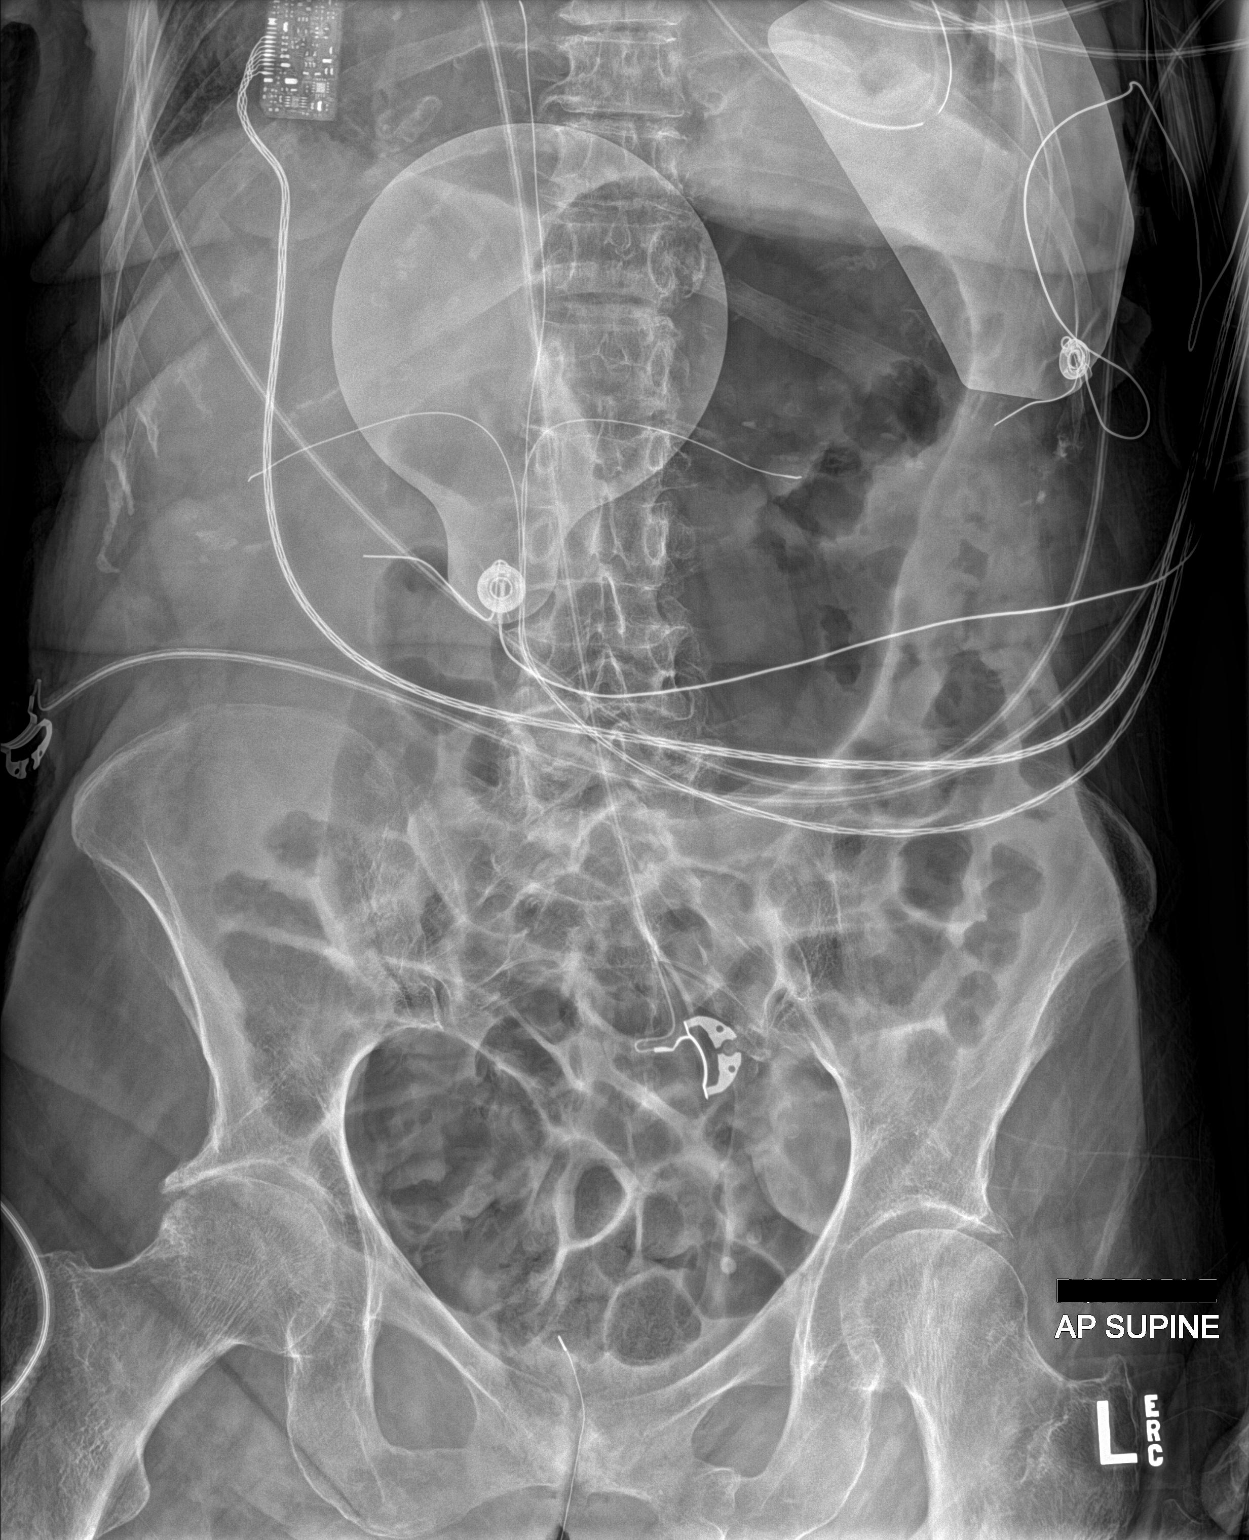

[abdomen supine (2 of 2)]
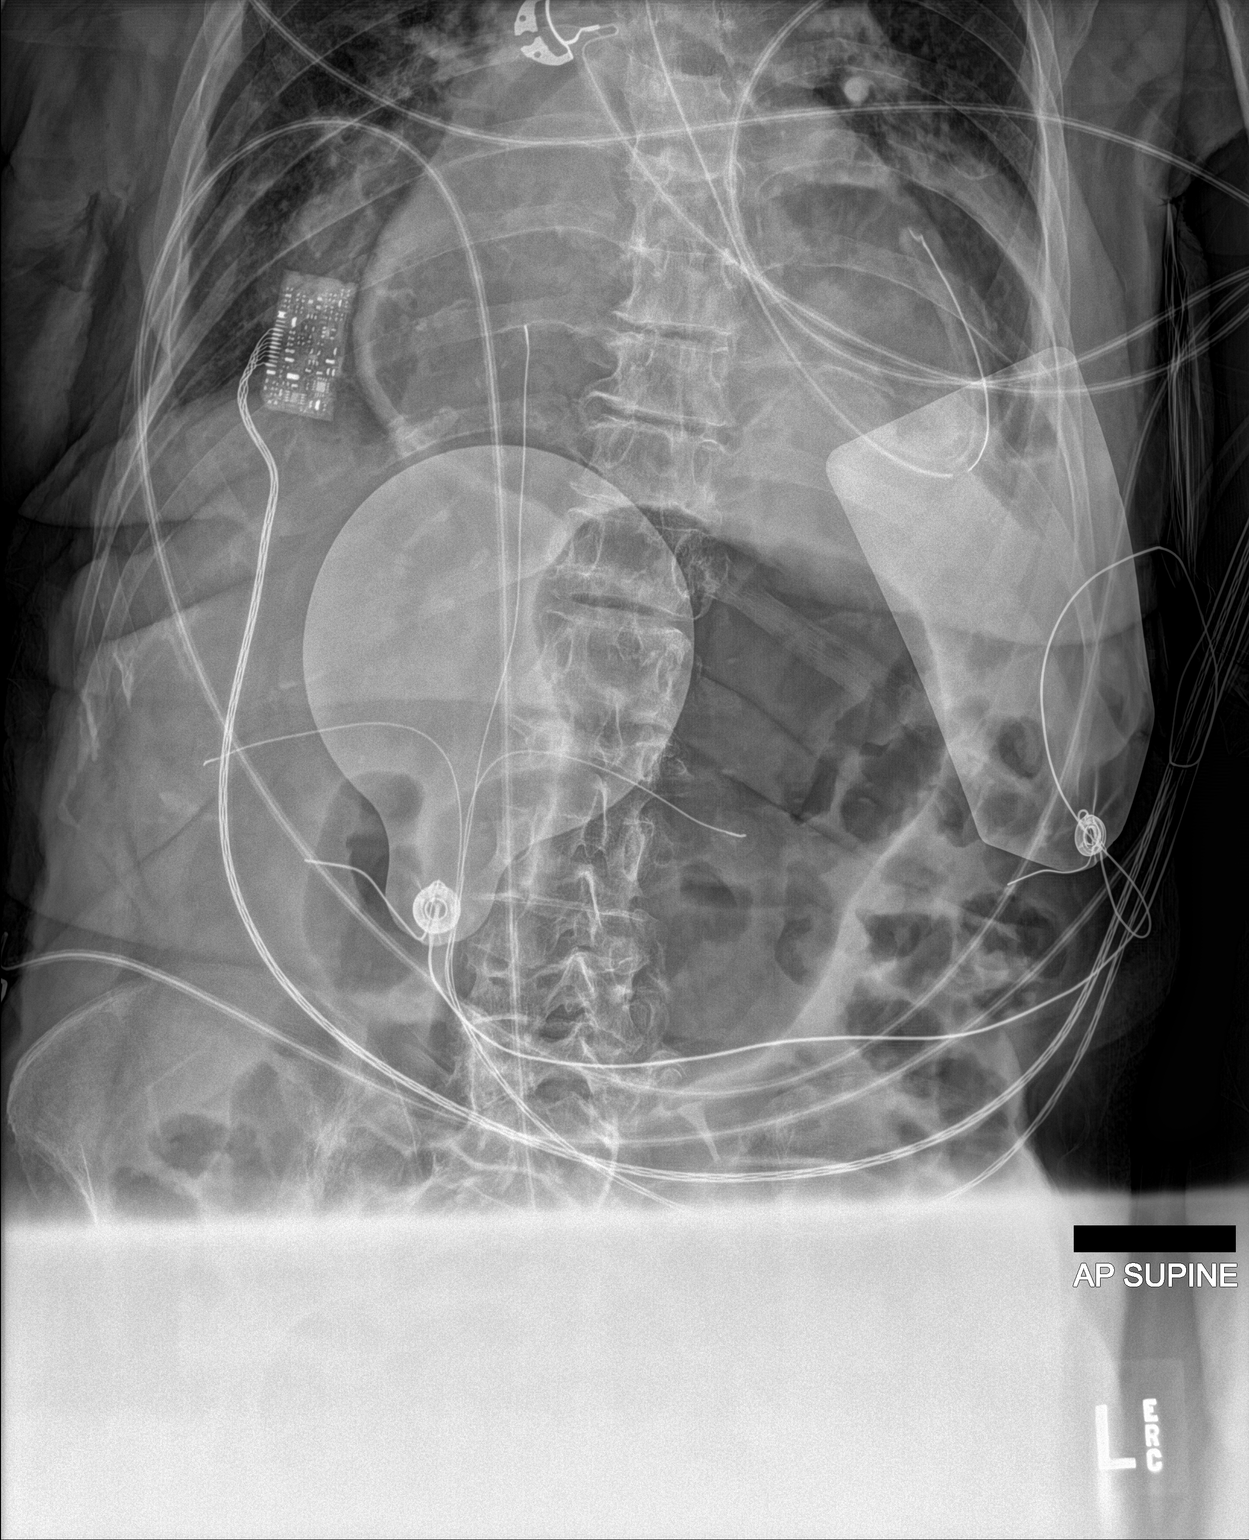

[2 of 2 positions shown; findings below may reference images not displayed]

FINDINGS: Nasogastric tube coiled in a large hiatal hernia at the lower LEFT
hemithorax.

Gaseous distention of the distal stomach below the diaphragm.

Air-filled loops of nondistended bowel throughout abdomen and
pelvis.

Bones demineralized.

External pacing leads noted.
IMPRESSION: Tip of nasogastric tube is coiled within a large hiatal hernia at
the inferior LEFT chest.
